# Patient Record
Sex: Male | Born: 1960
Health system: Southern US, Community
[De-identification: ages and names within clinical notes are randomized; demographics above are authoritative.]

## PROBLEM LIST (undated history)

## (undated) DIAGNOSIS — I340 Nonrheumatic mitral (valve) insufficiency: Secondary | ICD-10-CM

## (undated) DIAGNOSIS — F1410 Cocaine abuse, uncomplicated: Secondary | ICD-10-CM

## (undated) DIAGNOSIS — I2510 Atherosclerotic heart disease of native coronary artery without angina pectoris: Secondary | ICD-10-CM

## (undated) DIAGNOSIS — I219 Acute myocardial infarction, unspecified: Secondary | ICD-10-CM

## (undated) DIAGNOSIS — E7800 Pure hypercholesterolemia, unspecified: Secondary | ICD-10-CM

## (undated) DIAGNOSIS — I10 Essential (primary) hypertension: Secondary | ICD-10-CM

## (undated) DIAGNOSIS — T8859XA Other complications of anesthesia, initial encounter: Secondary | ICD-10-CM

## (undated) DIAGNOSIS — Z8679 Personal history of other diseases of the circulatory system: Secondary | ICD-10-CM

## (undated) DIAGNOSIS — T4145XA Adverse effect of unspecified anesthetic, initial encounter: Secondary | ICD-10-CM

## (undated) HISTORY — PX: TONSILLECTOMY: SUR1361

---

## 2002-08-28 ENCOUNTER — Encounter: Payer: Self-pay | Admitting: Emergency Medicine

## 2002-08-28 ENCOUNTER — Emergency Department (HOSPITAL_COMMUNITY): Admission: EM | Admit: 2002-08-28 | Discharge: 2002-08-28 | Payer: Self-pay | Admitting: Emergency Medicine

## 2002-09-02 ENCOUNTER — Emergency Department (HOSPITAL_COMMUNITY): Admission: EM | Admit: 2002-09-02 | Discharge: 2002-09-02 | Payer: Self-pay | Admitting: Emergency Medicine

## 2003-03-27 ENCOUNTER — Encounter
Admission: RE | Admit: 2003-03-27 | Discharge: 2003-06-25 | Payer: Self-pay | Admitting: Physical Medicine & Rehabilitation

## 2003-04-13 ENCOUNTER — Emergency Department (HOSPITAL_COMMUNITY): Admission: AD | Admit: 2003-04-13 | Discharge: 2003-04-13 | Payer: Self-pay | Admitting: Family Medicine

## 2003-05-08 ENCOUNTER — Emergency Department (HOSPITAL_COMMUNITY): Admission: EM | Admit: 2003-05-08 | Discharge: 2003-05-08 | Payer: Self-pay | Admitting: Emergency Medicine

## 2003-06-12 ENCOUNTER — Ambulatory Visit (HOSPITAL_COMMUNITY): Admission: RE | Admit: 2003-06-12 | Discharge: 2003-06-12 | Payer: Self-pay | Admitting: Orthopaedic Surgery

## 2005-04-06 ENCOUNTER — Emergency Department (HOSPITAL_COMMUNITY): Admission: EM | Admit: 2005-04-06 | Discharge: 2005-04-06 | Payer: Self-pay | Admitting: Emergency Medicine

## 2005-04-08 ENCOUNTER — Emergency Department (HOSPITAL_COMMUNITY): Admission: EM | Admit: 2005-04-08 | Discharge: 2005-04-08 | Payer: Self-pay | Admitting: Family Medicine

## 2005-04-12 ENCOUNTER — Emergency Department (HOSPITAL_COMMUNITY): Admission: AD | Admit: 2005-04-12 | Discharge: 2005-04-12 | Payer: Self-pay | Admitting: Family Medicine

## 2009-01-10 ENCOUNTER — Ambulatory Visit: Payer: Self-pay | Admitting: Internal Medicine

## 2009-01-10 DIAGNOSIS — M545 Low back pain: Secondary | ICD-10-CM

## 2009-01-10 DIAGNOSIS — M542 Cervicalgia: Secondary | ICD-10-CM

## 2009-01-10 DIAGNOSIS — I10 Essential (primary) hypertension: Secondary | ICD-10-CM | POA: Insufficient documentation

## 2009-01-10 DIAGNOSIS — M25519 Pain in unspecified shoulder: Secondary | ICD-10-CM

## 2009-01-16 ENCOUNTER — Telehealth (INDEPENDENT_AMBULATORY_CARE_PROVIDER_SITE_OTHER): Payer: Self-pay | Admitting: *Deleted

## 2009-01-21 ENCOUNTER — Telehealth: Payer: Self-pay | Admitting: Internal Medicine

## 2009-02-01 ENCOUNTER — Telehealth: Payer: Self-pay | Admitting: Internal Medicine

## 2009-02-14 ENCOUNTER — Telehealth: Payer: Self-pay | Admitting: Internal Medicine

## 2009-03-04 ENCOUNTER — Telehealth: Payer: Self-pay | Admitting: Internal Medicine

## 2009-03-07 ENCOUNTER — Telehealth: Payer: Self-pay | Admitting: Internal Medicine

## 2009-04-04 ENCOUNTER — Emergency Department (HOSPITAL_COMMUNITY): Admission: EM | Admit: 2009-04-04 | Discharge: 2009-04-04 | Payer: Self-pay | Admitting: Emergency Medicine

## 2011-06-12 ENCOUNTER — Emergency Department (HOSPITAL_COMMUNITY)
Admission: EM | Admit: 2011-06-12 | Discharge: 2011-06-13 | Disposition: A | Discharge status: Home / Self Care | Payer: No Typology Code available for payment source | Attending: Emergency Medicine | Admitting: Emergency Medicine

## 2011-06-12 ENCOUNTER — Encounter: Payer: Self-pay | Admitting: Emergency Medicine

## 2011-06-12 DIAGNOSIS — M79609 Pain in unspecified limb: Secondary | ICD-10-CM | POA: Insufficient documentation

## 2011-06-12 DIAGNOSIS — M25449 Effusion, unspecified hand: Secondary | ICD-10-CM | POA: Insufficient documentation

## 2011-06-12 DIAGNOSIS — S62609A Fracture of unspecified phalanx of unspecified finger, initial encounter for closed fracture: Secondary | ICD-10-CM | POA: Insufficient documentation

## 2011-06-12 DIAGNOSIS — S8000XA Contusion of unspecified knee, initial encounter: Secondary | ICD-10-CM | POA: Insufficient documentation

## 2011-06-12 DIAGNOSIS — S139XXA Sprain of joints and ligaments of unspecified parts of neck, initial encounter: Secondary | ICD-10-CM | POA: Insufficient documentation

## 2011-06-12 DIAGNOSIS — M25569 Pain in unspecified knee: Secondary | ICD-10-CM | POA: Insufficient documentation

## 2011-06-12 DIAGNOSIS — S161XXA Strain of muscle, fascia and tendon at neck level, initial encounter: Secondary | ICD-10-CM
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
## 2011-06-12 NOTE — ED Notes (Signed)
C-Collar applied

## 2011-06-13 ENCOUNTER — Emergency Department (HOSPITAL_COMMUNITY): Payer: Self-pay

## 2011-06-13 MED ORDER — HYDROCODONE-ACETAMINOPHEN 5-325 MG PO TABS: 2.0000 | ORAL_TABLET | ORAL | Status: AC | PRN

## 2011-06-13 MED ORDER — HYDROCODONE-ACETAMINOPHEN 5-325 MG PO TABS
1.0000 | ORAL_TABLET | Freq: Once | ORAL | Status: AC
  Administered 2011-06-13: 1 via ORAL
  Filled 2011-06-13: qty 1

## 2011-06-13 MED ORDER — IBUPROFEN 800 MG PO TABS
800.0000 mg | ORAL_TABLET | Freq: Once | ORAL | Status: AC
  Administered 2011-06-13: 800 mg via ORAL
  Filled 2011-06-13: qty 1

## 2011-06-13 MED ORDER — DIAZEPAM 5 MG PO TABS
5.0000 mg | ORAL_TABLET | Freq: Once | ORAL | Status: AC
  Administered 2011-06-13: 5 mg via ORAL
  Filled 2011-06-13: qty 1

## 2011-06-13 MED ORDER — HYDROCODONE-ACETAMINOPHEN 7.5-325 MG/15ML PO SOLN: 15.0000 mL | Freq: Four times a day (QID) | ORAL | Status: DC | PRN

## 2011-06-13 MED ORDER — IBUPROFEN 400 MG PO TABS: 400.0000 mg | ORAL_TABLET | Freq: Four times a day (QID) | ORAL | Status: AC | PRN

## 2011-06-13 MED ORDER — CYCLOBENZAPRINE HCL 10 MG PO TABS: 10.0000 mg | ORAL_TABLET | Freq: Three times a day (TID) | ORAL | Status: AC | PRN
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
## 2013-07-03 ENCOUNTER — Encounter (HOSPITAL_COMMUNITY): Payer: Self-pay | Admitting: Emergency Medicine

## 2013-07-03 ENCOUNTER — Emergency Department (HOSPITAL_COMMUNITY)
Admission: EM | Admit: 2013-07-03 | Discharge: 2013-07-03 | Disposition: A | Discharge status: Home / Self Care | Payer: No Typology Code available for payment source | Attending: Emergency Medicine | Admitting: Emergency Medicine

## 2013-07-03 DIAGNOSIS — M5430 Sciatica, unspecified side: Secondary | ICD-10-CM | POA: Insufficient documentation

## 2013-07-03 DIAGNOSIS — M545 Low back pain, unspecified: Secondary | ICD-10-CM | POA: Insufficient documentation

## 2013-07-03 DIAGNOSIS — M5432 Sciatica, left side: Secondary | ICD-10-CM

## 2013-07-03 DIAGNOSIS — Z79899 Other long term (current) drug therapy: Secondary | ICD-10-CM | POA: Insufficient documentation

## 2013-07-03 DIAGNOSIS — Z888 Allergy status to other drugs, medicaments and biological substances status: Secondary | ICD-10-CM | POA: Insufficient documentation

## 2013-07-03 MED ORDER — MELOXICAM 7.5 MG PO TABS: 15.0000 mg | ORAL_TABLET | Freq: Every day | ORAL | Status: DC

## 2013-07-03 MED ORDER — METHOCARBAMOL 500 MG PO TABS: 500.0000 mg | ORAL_TABLET | Freq: Two times a day (BID) | ORAL | Status: DC

## 2013-07-03 NOTE — ED Notes (Signed)
Pt discharged.Vital signs stable and GCS 15 

## 2013-07-03 NOTE — Discharge Instructions (Signed)
Recommend Mobic and Robaxin for symptoms as prescribed. Also recommend ice the affected area at least 3-4 times per day for at least 30 minutes each time until symptoms improve. Refrain from strenuous activity or heavy lifting. Followup with an orthopedist as necessary if no improvement in symptoms.  Sciatica Sciatica is pain, weakness, numbness, or tingling along the path of the sciatic nerve. The nerve starts in the lower back and runs down the back of each leg. The nerve controls the muscles in the lower leg and in the back of the knee, while also providing sensation to the back of the thigh, lower leg, and the sole of your foot. Sciatica is a symptom of another medical condition. For instance, nerve damage or certain conditions, such as a herniated disk or bone spur on the spine, pinch or put pressure on the sciatic nerve. This causes the pain, weakness, or other sensations normally associated with sciatica. Generally, sciatica only affects one side of the body. CAUSES   Herniated or slipped disc.  Degenerative disk disease.  A pain disorder involving the narrow muscle in the buttocks (piriformis syndrome).  Pelvic injury or fracture.  Pregnancy.  Tumor (rare). SYMPTOMS  Symptoms can vary from mild to very severe. The symptoms usually travel from the low back to the buttocks and down the back of the leg. Symptoms can include:  Mild tingling or dull aches in the lower back, leg, or hip.  Numbness in the back of the calf or sole of the foot.  Burning sensations in the lower back, leg, or hip.  Sharp pains in the lower back, leg, or hip.  Leg weakness.  Severe back pain inhibiting movement. These symptoms may get worse with coughing, sneezing, laughing, or prolonged sitting or standing. Also, being overweight may worsen symptoms. DIAGNOSIS  Your caregiver will perform a physical exam to look for common symptoms of sciatica. He or she may ask you to do certain movements or activities  that would trigger sciatic nerve pain. Other tests may be performed to find the cause of the sciatica. These may include:  Blood tests.  X-rays.  Imaging tests, such as an MRI or CT scan. TREATMENT  Treatment is directed at the cause of the sciatic pain. Sometimes, treatment is not necessary and the pain and discomfort goes away on its own. If treatment is needed, your caregiver may suggest:  Over-the-counter medicines to relieve pain.  Prescription medicines, such as anti-inflammatory medicine, muscle relaxants, or narcotics.  Applying heat or ice to the painful area.  Steroid injections to lessen pain, irritation, and inflammation around the nerve.  Reducing activity during periods of pain.  Exercising and stretching to strengthen your abdomen and improve flexibility of your spine. Your caregiver may suggest losing weight if the extra weight makes the back pain worse.  Physical therapy.  Surgery to eliminate what is pressing or pinching the nerve, such as a bone spur or part of a herniated disk. HOME CARE INSTRUCTIONS   Only take over-the-counter or prescription medicines for pain or discomfort as directed by your caregiver.  Apply ice to the affected area for 20 minutes, 3 4 times a day for the first 48 72 hours. Then try heat in the same way.  Exercise, stretch, or perform your usual activities if these do not aggravate your pain.  Attend physical therapy sessions as directed by your caregiver.  Keep all follow-up appointments as directed by your caregiver.  Do not wear high heels or shoes that do  not provide proper support.  Check your mattress to see if it is too soft. A firm mattress may lessen your pain and discomfort. SEEK IMMEDIATE MEDICAL CARE IF:   You lose control of your bowel or bladder (incontinence).  You have increasing weakness in the lower back, pelvis, buttocks, or legs.  You have redness or swelling of your back.  You have a burning sensation when  you urinate.  You have pain that gets worse when you lie down or awakens you at night.  Your pain is worse than you have experienced in the past.  Your pain is lasting longer than 4 weeks.  You are suddenly losing weight without reason. MAKE SURE YOU:  Understand these instructions.  Will watch your condition.  Will get help right away if you are not doing well or get worse. Document Released: 05/26/2001 Document Revised: 12/01/2011 Document Reviewed: 10/11/2011 [facility] Patient Information 2014 [place], [place].

## 2013-07-03 NOTE — ED Notes (Signed)
Pt states woke up 4 days ago with left lower back and left hip pain and pain radiates down his leg.  No injury
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
## 2013-07-06 NOTE — ED Provider Notes (Signed)
Medical screening examination/treatment/procedure(s) were performed by non-physician practitioner and as supervising physician I was immediately available for consultation/collaboration.  Talbot Monarch M Sueko Dimichele, MD 07/06/13 1842 

## 2014-01-25 ENCOUNTER — Emergency Department (HOSPITAL_COMMUNITY)
Admission: EM | Admit: 2014-01-25 | Discharge: 2014-01-25 | Disposition: A | Discharge status: Home / Self Care | Payer: No Typology Code available for payment source | Attending: Emergency Medicine | Admitting: Emergency Medicine

## 2014-01-25 ENCOUNTER — Encounter (HOSPITAL_COMMUNITY): Payer: Self-pay | Admitting: Emergency Medicine

## 2014-01-25 DIAGNOSIS — M79609 Pain in unspecified limb: Secondary | ICD-10-CM | POA: Insufficient documentation

## 2014-01-25 DIAGNOSIS — H6092 Unspecified otitis externa, left ear: Secondary | ICD-10-CM

## 2014-01-25 DIAGNOSIS — Z79899 Other long term (current) drug therapy: Secondary | ICD-10-CM | POA: Insufficient documentation

## 2014-01-25 DIAGNOSIS — H60399 Other infective otitis externa, unspecified ear: Secondary | ICD-10-CM | POA: Insufficient documentation

## 2014-01-25 DIAGNOSIS — H9209 Otalgia, unspecified ear: Secondary | ICD-10-CM | POA: Insufficient documentation

## 2014-01-25 DIAGNOSIS — Z791 Long term (current) use of non-steroidal anti-inflammatories (NSAID): Secondary | ICD-10-CM | POA: Insufficient documentation

## 2014-01-25 MED ORDER — CIPROFLOXACIN-HYDROCORTISONE 0.2-1 % OT SUSP: 3.0000 [drp] | Freq: Two times a day (BID) | OTIC | Status: DC

## 2014-01-25 NOTE — Discharge Instructions (Signed)
Use antibiotics ear drops as directed for 7 days. Refer to attached documents for more information. Follow up with your doctor for further evaluation.

## 2014-01-25 NOTE — ED Notes (Signed)
Pt is here with left ear pain and hard to hear out of it.  Pt complains of knot under right arm that hurt and now does not hurt but it is still there

## 2014-01-25 NOTE — ED Provider Notes (Signed)
CSN: [id number]     Arrival date & time 01/25/14  1657 History  This chart was scribed for Emilia BeckKaitlyn Nadalie Laughner, working with Toy CookeyMegan Docherty, MD found by Elon SpannerGarrett Cook, ED Scribe. This patient was seen in room TR07C/TR07C and the patient's care was started at 5:10 PM.    Chief Complaint  Patient presents with  . Otalgia    Patient is a 53 y.o. male presenting with ear pain. The history is provided by the patient. No language interpreter was used.  Otalgia Location:  Left Severity:  Mild Onset quality:  Gradual Duration:  2 days Timing:  Constant Progression:  Unchanged Chronicity:  Recurrent Worsened by:  Nothing tried Associated symptoms: no congestion, no cough, no fever, no neck pain and no sore throat     HPI Comments: Bryce Ortega is a 53 y.o. male who presents to the Emergency Department complaining of recurrence mild left ear pain that began several days ago.  Patient states he was diagnosed with an ear infection in June and prescribed neomycin, of which he compliant and has completed the course. He states the ear pain resolved at that time but has recurred since then within the last 2 days.  Patient denies cough, sore throat, fever, congestion, neck pain.  No history of DM.    Patient also complains of a "knot" under right arm with associated, currently resolved, pain in the area.  History reviewed. No pertinent past medical history. Past Surgical History  Procedure Laterality Date  . Tonsillectomy     No family history on file. History  Substance Use Topics  . Smoking status: Never Smoker   . Smokeless tobacco: Not on file  . Alcohol Use: Yes     Comment: occ    Review of Systems  Constitutional: Negative for fever.  HENT: Positive for ear pain. Negative for congestion and sore throat.   Respiratory: Negative for cough.   Musculoskeletal: Negative for neck pain.  All other systems reviewed and are negative.     Allergies  Propoxyphene n-acetaminophen  Home  Medications   Prior to Admission medications   Medication Sig Start Date End Date Taking? Authorizing Provider  losartan-hydrochlorothiazide (HYZAAR) 50-12.5 MG per tablet Take 1 tablet by mouth daily.    Historical Provider, MD  meloxicam (MOBIC) 7.5 MG tablet Take 2 tablets (15 mg total) by mouth daily. 07/03/13   Antony MaduraKelly Humes, PA-C  methocarbamol (ROBAXIN) 500 MG tablet Take 1 tablet (500 mg total) by mouth 2 (two) times daily. 07/03/13   Antony MaduraKelly Humes, PA-C  simvastatin (ZOCOR) 20 MG tablet Take 20 mg by mouth daily.    Historical Provider, MD   BP 145/78  Pulse 76  Temp(Src) 97.9 F (36.6 C) (Oral)  Resp 18  SpO2 96% Physical Exam  Nursing note and vitals reviewed. Constitutional: He is oriented to person, place, and time. He appears well-developed and well-nourished. No distress.  HENT:  Head: Normocephalic and atraumatic.  Mouth/Throat: Oropharynx is clear and moist. No oropharyngeal exudate.  Left external ear edema, erythema and purulent exudate. Tenderness to palpation. Right ear unremarkable.   Eyes: Conjunctivae and EOM are normal.  Neck: Normal range of motion.  Cardiovascular: Normal rate and regular rhythm.  Exam reveals no gallop and no friction rub.   No murmur heard. Pulmonary/Chest: Effort normal and breath sounds normal. He has no wheezes. He has no rales. He exhibits no tenderness.  Musculoskeletal: Normal range of motion.  Lymphadenopathy:    He has no cervical adenopathy.  Neurological: He is alert and oriented to person, place, and time. Coordination normal.  Speech is goal-oriented. Moves limbs without ataxia.   Skin: Skin is warm and dry.  Psychiatric: He has a normal mood and affect. His behavior is normal.    ED Course  Procedures (including critical care time)  DIAGNOSTIC STUDIES: Oxygen Saturation is 96% on RA, normal by my interpretation.    COORDINATION OF CARE:  5:17 PM Will prescribe antibiotics.  Patient acknowledges and agrees with plan.     Labs Review Labs Reviewed - No data to display  Imaging Review No results found.   EKG Interpretation None      MDM   Final diagnoses:  Otitis externa; acute, left    Patient has exudative otitis externa of the left ear. Patient will have cipro HC otic solution. Vitals stable and patient afebrile.   I personally performed the services described in this documentation, which was scribed in my presence. The recorded information has been reviewed and is accurate.    Emilia Beck, PA-C 01/25/14 1930

## 2014-01-26 NOTE — ED Provider Notes (Signed)
Medical screening examination/treatment/procedure(s) were performed by non-physician practitioner and as supervising physician I was immediately available for consultation/collaboration.  Toy Cookey, MD 01/26/14 2012

## 2014-06-15 DIAGNOSIS — I219 Acute myocardial infarction, unspecified: Secondary | ICD-10-CM

## 2014-06-15 DIAGNOSIS — Z8679 Personal history of other diseases of the circulatory system: Secondary | ICD-10-CM

## 2014-06-15 DIAGNOSIS — I2510 Atherosclerotic heart disease of native coronary artery without angina pectoris: Secondary | ICD-10-CM

## 2014-06-15 HISTORY — DX: Personal history of other diseases of the circulatory system: Z86.79

## 2014-06-15 HISTORY — DX: Acute myocardial infarction, unspecified: I21.9

## 2014-06-15 HISTORY — DX: Atherosclerotic heart disease of native coronary artery without angina pectoris: I25.10

## 2015-01-30 ENCOUNTER — Emergency Department (HOSPITAL_COMMUNITY)
Admission: EM | Admit: 2015-01-30 | Discharge: 2015-01-30 | Disposition: A | Discharge status: Home / Self Care | Payer: No Typology Code available for payment source | Attending: Emergency Medicine | Admitting: Emergency Medicine

## 2015-01-30 ENCOUNTER — Encounter (HOSPITAL_COMMUNITY): Payer: Self-pay | Admitting: Emergency Medicine

## 2015-01-30 DIAGNOSIS — Z79899 Other long term (current) drug therapy: Secondary | ICD-10-CM | POA: Insufficient documentation

## 2015-01-30 DIAGNOSIS — R21 Rash and other nonspecific skin eruption: Secondary | ICD-10-CM | POA: Diagnosis present

## 2015-01-30 DIAGNOSIS — L237 Allergic contact dermatitis due to plants, except food: Secondary | ICD-10-CM | POA: Diagnosis not present

## 2015-01-30 DIAGNOSIS — Z792 Long term (current) use of antibiotics: Secondary | ICD-10-CM | POA: Insufficient documentation

## 2015-01-30 DIAGNOSIS — I10 Essential (primary) hypertension: Secondary | ICD-10-CM | POA: Insufficient documentation

## 2015-01-30 DIAGNOSIS — E780 Pure hypercholesterolemia: Secondary | ICD-10-CM | POA: Diagnosis not present

## 2015-01-30 DIAGNOSIS — Z7952 Long term (current) use of systemic steroids: Secondary | ICD-10-CM | POA: Insufficient documentation

## 2015-01-30 HISTORY — DX: Pure hypercholesterolemia, unspecified: E78.00

## 2015-01-30 HISTORY — DX: Essential (primary) hypertension: I10

## 2015-01-30 MED ORDER — PREDNISONE 20 MG PO TABS: ORAL_TABLET | ORAL | Status: DC

## 2015-01-30 MED ORDER — HYDROXYZINE HCL 25 MG PO TABS
25.0000 mg | ORAL_TABLET | Freq: Once | ORAL | Status: AC
  Administered 2015-01-30: 25 mg via ORAL
  Filled 2015-01-30: qty 1

## 2015-01-30 MED ORDER — DIPHENHYDRAMINE HCL 25 MG PO TABS: 25.0000 mg | ORAL_TABLET | Freq: Four times a day (QID) | ORAL | Status: DC

## 2015-01-30 NOTE — ED Provider Notes (Signed)
History  This chart was scribed for non-physician practitioner, Mayme Genta, PA-C,working with Lavera Guise, MD, by Karle Plumber, ED Scribe. This patient was seen in room TR08C/TR08C and the patient's care was started at 4:10 PM.  Chief Complaint  Patient presents with  . Poison [place]   The history is provided by the patient and medical records. No language interpreter was used.    HPI Comments:  Bryce Ortega is a 54 y.o. male who presents to the Emergency Department complaining of an itching rash to bilateral arms, bilateral legs, neck, face and abdomen that began yesterday after cutting bushes in his yard. He reports associated burning of the rash. He states the itching is a 10/10. He has applied Calamine lotion to the areas with mild relief of the symptoms. He denies modifying factors. He denies fever ,chills, nausea, vomiting, difficulty breathing or difficulty swallowing.   Past Medical History  Diagnosis Date  . Hypertension   . High cholesterol    Past Surgical History  Procedure Laterality Date  . Tonsillectomy     No family history on file. Social History  Substance Use Topics  . Smoking status: Never Smoker   . Smokeless tobacco: None  . Alcohol Use: Yes     Comment: occ    Review of Systems  Constitutional: Negative for fever and chills.  HENT: Negative for trouble swallowing.   Respiratory: Negative for shortness of breath.   Gastrointestinal: Negative for nausea and vomiting.  Skin: Positive for rash.  All other systems reviewed and are negative.   Allergies  Propoxyphene n-acetaminophen  Home Medications   Prior to Admission medications   Medication Sig Start Date End Date Taking? Authorizing Provider  ciprofloxacin-hydrocortisone (CIPRO HC) otic suspension Place 3 drops into the left ear 2 (two) times daily. 01/25/14   Kaitlyn Szekalski, PA-C  diphenhydrAMINE (BENADRYL) 25 MG tablet Take 1 tablet (25 mg total) by mouth every 6 (six) hours. 01/30/15    Joycie Peek, PA-C  losartan-hydrochlorothiazide (HYZAAR) 50-12.5 MG per tablet Take 1 tablet by mouth daily.    Historical Provider, MD  neomycin-polymyxin-hydrocortisone (CORTISPORIN) otic solution Place 4 drops into the left ear 3 (three) times daily.     Historical Provider, MD  predniSONE (DELTASONE) 20 MG tablet 3 tabs po daily x 3 days, then 2 tabs x 3 days, then 1.5 tabs x 3 days, then 1 tab x 3 days, then 0.5 tabs x 3 days 01/30/15   Joycie Peek, PA-C  simvastatin (ZOCOR) 20 MG tablet Take 20 mg by mouth daily at 6 PM.     Historical Provider, MD   Triage Vitals: BP 137/93 mmHg  Pulse 63  Temp(Src) 98.1 F (36.7 C) (Oral)  Resp 16  Ht  (1.803 m)  Wt 168 lb (76.204 kg)  BMI 23.44 kg/m2  SpO2 97% Physical Exam  Constitutional: He is oriented to person, place, and time. He appears well-developed and well-nourished.  HENT:  Head: Normocephalic and atraumatic.  Mouth/Throat: Uvula is midline, oropharynx is clear and moist and mucous membranes are normal.  Eyes: EOM are normal.  Neck: Normal range of motion.  Cardiovascular: Normal rate, regular rhythm and normal heart sounds.  Exam reveals no gallop and no friction rub.   No murmur heard. Pulmonary/Chest: Effort normal and breath sounds normal. No respiratory distress.  Abdominal: Soft. There is no tenderness.  Musculoskeletal: Normal range of motion.  Neurological: He is alert and oriented to person, place, and time.  Skin: Skin is  warm and dry.  Diffused maculopapular rash with localized erythema. Follows a linear pattern.  Psychiatric: He has a normal mood and affect. His behavior is normal.  Nursing note and vitals reviewed.   ED Course  Procedures (including critical care time) DIAGNOSTIC STUDIES: Oxygen Saturation is 97% on RA, normal by my interpretation.   COORDINATION OF CARE: 4:13 PM- Will prescribe Prednisone and Hydroxyzine. Advised pt he could take OTC Benadryl and will order a dose prior to  discharge. Pt verbalizes understanding and agrees to plan.  Medications  hydrOXYzine (ATARAX/VISTARIL) tablet 25 mg (25 mg Oral Given 01/30/15 1622)    Labs Review Labs Reviewed - No data to display  Imaging Review No results found. I have personally reviewed and evaluated these images and lab results as part of my medical decision-making.   EKG Interpretation None     Meds given in ED:  Medications  hydrOXYzine (ATARAX/VISTARIL) tablet 25 mg (25 mg Oral Given 01/30/15 1622)    New Prescriptions   DIPHENHYDRAMINE (BENADRYL) 25 MG TABLET    Take 1 tablet (25 mg total) by mouth every 6 (six) hours.   PREDNISONE (DELTASONE) 20 MG TABLET    3 tabs po daily x 3 days, then 2 tabs x 3 days, then 1.5 tabs x 3 days, then 1 tab x 3 days, then 0.5 tabs x 3 days   Filed Vitals:   01/30/15 1517  BP: 137/93  Pulse: 63  Temp: 98.1 F (36.7 C)  TempSrc: Oral  Resp: 16  Height: 5\' 11"  (1.803 m)  Weight: 168 lb (76.204 kg)  SpO2: 97%    MDM  Vitals stable - WNL -afebrile Pt resting comfortably in ED. PE--physical exam as mentioned above and is consistent with urushiol poisoning. No evidence of anaphylaxis. No mucous membrane involvement. Treat with steroid taper, hydroxyzine for itching. Also encouraged concomitant Benadryl use if unable to fill prescription.  I discussed all relevant lab findings and imaging results with pt and they verbalized understanding. Discussed f/u with PCP within 48 hrs and return precautions, pt very amenable to plan.  Final diagnoses:  Rash  Poison ivy     I personally performed the services described in this documentation, which was scribed in my presence. The recorded information has been reviewed and is accurate.    Joycie Peek, PA-C 01/30/15 1640  Lavera Guise, MD 01/31/15 0200

## 2015-01-30 NOTE — Discharge Instructions (Signed)
Please take your medications as prescribed. Take all of your medications and do not share them. Follow up with her primary care as needed. Return to ED for worsening symptoms.  Allergies Allergies may happen from anything your body is sensitive to. This may be food, medicines, pollens, chemicals, and nearly anything around you in everyday life that produces allergens. An allergen is anything that causes an allergy producing substance. Heredity is often a factor in causing these problems. This means you may have some of the same allergies as your parents. Food allergies happen in all age groups. Food allergies are some of the most severe and life threatening. Some common food allergies are cow's milk, seafood, eggs, nuts, wheat, and soybeans. SYMPTOMS   Swelling around the mouth.  An itchy red rash or hives.  Vomiting or diarrhea.  Difficulty breathing. SEVERE ALLERGIC REACTIONS ARE LIFE-THREATENING. This reaction is called anaphylaxis. It can cause the mouth and throat to swell and cause difficulty with breathing and swallowing. In severe reactions only a trace amount of food (for example, peanut oil in a salad) may cause death within seconds. Seasonal allergies occur in all age groups. These are seasonal because they usually occur during the same season every year. They may be a reaction to molds, grass pollens, or tree pollens. Other causes of problems are house dust mite allergens, pet dander, and mold spores. The symptoms often consist of nasal congestion, a runny itchy nose associated with sneezing, and tearing itchy eyes. There is often an associated itching of the mouth and ears. The problems happen when you come in contact with pollens and other allergens. Allergens are the particles in the air that the body reacts to with an allergic reaction. This causes you to release allergic antibodies. Through a chain of events, these eventually cause you to release histamine into the blood stream.  Although it is meant to be protective to the body, it is this release that causes your discomfort. This is why you were given anti-histamines to feel better. If you are unable to pinpoint the offending allergen, it may be determined by skin or blood testing. Allergies cannot be cured but can be controlled with medicine. Hay fever is a collection of all or some of the seasonal allergy problems. It may often be treated with simple over-the-counter medicine such as diphenhydramine. Take medicine as directed. Do not drink alcohol or drive while taking this medicine. Check with your caregiver or package insert for child dosages. If these medicines are not effective, there are many new medicines your caregiver can prescribe. Stronger medicine such as nasal spray, eye drops, and corticosteroids may be used if the first things you try do not work well. Other treatments such as immunotherapy or desensitizing injections can be used if all else fails. Follow up with your caregiver if problems continue. These seasonal allergies are usually not life threatening. They are generally more of a nuisance that can often be handled using medicine. HOME CARE INSTRUCTIONS   If unsure what causes a reaction, keep a diary of foods eaten and symptoms that follow. Avoid foods that cause reactions.  If hives or rash are present:  Take medicine as directed.  You may use an over-the-counter antihistamine (diphenhydramine) for hives and itching as needed.  Apply cold compresses (cloths) to the skin or take baths in cool water. Avoid hot baths or showers. Heat will make a rash and itching worse.  If you are severely allergic:  Following a treatment for a severe  reaction, hospitalization is often required for closer follow-up.  Wear a medic-alert bracelet or necklace stating the allergy.  You and your family must learn how to give adrenaline or use an anaphylaxis kit.  If you have had a severe reaction, always carry your  anaphylaxis kit or EpiPen with you. Use this medicine as directed by your caregiver if a severe reaction is occurring. Failure to do so could have a fatal outcome. SEEK MEDICAL CARE IF:  You suspect a food allergy. Symptoms generally happen within 30 minutes of eating a food.  Your symptoms have not gone away within 2 days or are getting worse.  You develop new symptoms.  You want to retest yourself or your child with a food or drink you think causes an allergic reaction. Never do this if an anaphylactic reaction to that food or drink has happened before. Only do this under the care of a caregiver. SEEK IMMEDIATE MEDICAL CARE IF:   You have difficulty breathing, are wheezing, or have a tight feeling in your chest or throat.  You have a swollen mouth, or you have hives, swelling, or itching all over your body.  You have had a severe reaction that has responded to your anaphylaxis kit or an EpiPen. These reactions may return when the medicine has worn off. These reactions should be considered life threatening. MAKE SURE YOU:   Understand these instructions.  Will watch your condition.  Will get help right away if you are not doing well or get worse. Document Released: 08/25/2002 Document Revised: 09/26/2012 Document Reviewed: 01/30/2008 [facility] Patient Information 2015 [place], [place]. This information is not intended to replace advice given to you by your health care provider. Make sure you discuss any questions you have with your health care provider.

## 2015-01-30 NOTE — ED Notes (Signed)
C/o poison ivy on bilateral arms, legs, face, and neck since cutting brush yesterday.

## 2015-01-30 NOTE — ED Notes (Signed)
Pt left with all belongings. 

## 2015-05-01 ENCOUNTER — Emergency Department (HOSPITAL_COMMUNITY)
Admission: EM | Admit: 2015-05-01 | Discharge: 2015-05-01 | Disposition: A | Discharge status: Home / Self Care | Payer: No Typology Code available for payment source | Attending: Emergency Medicine | Admitting: Emergency Medicine

## 2015-05-01 ENCOUNTER — Encounter (HOSPITAL_COMMUNITY): Payer: Self-pay | Admitting: Emergency Medicine

## 2015-05-01 ENCOUNTER — Emergency Department (HOSPITAL_COMMUNITY): Payer: No Typology Code available for payment source

## 2015-05-01 DIAGNOSIS — S3992XA Unspecified injury of lower back, initial encounter: Secondary | ICD-10-CM | POA: Insufficient documentation

## 2015-05-01 DIAGNOSIS — Y92410 Unspecified street and highway as the place of occurrence of the external cause: Secondary | ICD-10-CM | POA: Diagnosis not present

## 2015-05-01 DIAGNOSIS — M549 Dorsalgia, unspecified: Secondary | ICD-10-CM

## 2015-05-01 DIAGNOSIS — E7800 Pure hypercholesterolemia, unspecified: Secondary | ICD-10-CM | POA: Diagnosis not present

## 2015-05-01 DIAGNOSIS — I10 Essential (primary) hypertension: Secondary | ICD-10-CM | POA: Insufficient documentation

## 2015-05-01 DIAGNOSIS — Z79899 Other long term (current) drug therapy: Secondary | ICD-10-CM | POA: Diagnosis not present

## 2015-05-01 DIAGNOSIS — Y9389 Activity, other specified: Secondary | ICD-10-CM | POA: Insufficient documentation

## 2015-05-01 DIAGNOSIS — S199XXA Unspecified injury of neck, initial encounter: Secondary | ICD-10-CM | POA: Diagnosis not present

## 2015-05-01 DIAGNOSIS — Y998 Other external cause status: Secondary | ICD-10-CM | POA: Diagnosis not present

## 2015-05-01 DIAGNOSIS — S29009A Unspecified injury of muscle and tendon of unspecified wall of thorax, initial encounter: Secondary | ICD-10-CM | POA: Insufficient documentation

## 2015-05-01 DIAGNOSIS — Z7952 Long term (current) use of systemic steroids: Secondary | ICD-10-CM | POA: Diagnosis not present

## 2015-05-01 DIAGNOSIS — Z792 Long term (current) use of antibiotics: Secondary | ICD-10-CM | POA: Insufficient documentation

## 2015-05-01 MED ORDER — CYCLOBENZAPRINE HCL 10 MG PO TABS: 10.0000 mg | ORAL_TABLET | Freq: Two times a day (BID) | ORAL | Status: DC | PRN
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
## 2015-05-01 NOTE — Discharge Instructions (Signed)
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
## 2015-05-01 NOTE — ED Notes (Signed)
Patient placed in gown

## 2015-05-10 ENCOUNTER — Other Ambulatory Visit: Payer: Self-pay

## 2015-05-10 ENCOUNTER — Inpatient Hospital Stay (HOSPITAL_COMMUNITY)
Admission: EM | Admit: 2015-05-10 | Discharge: 2015-05-11 | DRG: 896 | Disposition: A | Discharge status: Home / Self Care | Payer: No Typology Code available for payment source | Attending: Internal Medicine | Admitting: Internal Medicine

## 2015-05-10 ENCOUNTER — Other Ambulatory Visit (HOSPITAL_COMMUNITY): Payer: Self-pay

## 2015-05-10 ENCOUNTER — Encounter (HOSPITAL_COMMUNITY): Payer: Self-pay | Admitting: Emergency Medicine

## 2015-05-10 ENCOUNTER — Emergency Department (HOSPITAL_COMMUNITY): Payer: No Typology Code available for payment source

## 2015-05-10 ENCOUNTER — Encounter (HOSPITAL_COMMUNITY)
Admission: EM | Disposition: A | Discharge status: Home / Self Care | Payer: Self-pay | Source: Home / Self Care | Attending: Internal Medicine

## 2015-05-10 DIAGNOSIS — F1410 Cocaine abuse, uncomplicated: Secondary | ICD-10-CM

## 2015-05-10 DIAGNOSIS — Z79899 Other long term (current) drug therapy: Secondary | ICD-10-CM | POA: Diagnosis not present

## 2015-05-10 DIAGNOSIS — I10 Essential (primary) hypertension: Secondary | ICD-10-CM | POA: Diagnosis present

## 2015-05-10 DIAGNOSIS — I4901 Ventricular fibrillation: Secondary | ICD-10-CM | POA: Diagnosis present

## 2015-05-10 DIAGNOSIS — E785 Hyperlipidemia, unspecified: Secondary | ICD-10-CM | POA: Diagnosis present

## 2015-05-10 DIAGNOSIS — E876 Hypokalemia: Secondary | ICD-10-CM | POA: Diagnosis present

## 2015-05-10 DIAGNOSIS — E7800 Pure hypercholesterolemia, unspecified: Secondary | ICD-10-CM | POA: Diagnosis present

## 2015-05-10 DIAGNOSIS — I2510 Atherosclerotic heart disease of native coronary artery without angina pectoris: Secondary | ICD-10-CM

## 2015-05-10 DIAGNOSIS — N179 Acute kidney failure, unspecified: Secondary | ICD-10-CM | POA: Diagnosis present

## 2015-05-10 DIAGNOSIS — I25111 Atherosclerotic heart disease of native coronary artery with angina pectoris with documented spasm: Secondary | ICD-10-CM | POA: Diagnosis present

## 2015-05-10 DIAGNOSIS — I469 Cardiac arrest, cause unspecified: Secondary | ICD-10-CM | POA: Diagnosis present

## 2015-05-10 DIAGNOSIS — I200 Unstable angina: Secondary | ICD-10-CM

## 2015-05-10 DIAGNOSIS — F14188 Cocaine abuse with other cocaine-induced disorder: Secondary | ICD-10-CM | POA: Diagnosis present

## 2015-05-10 DIAGNOSIS — I249 Acute ischemic heart disease, unspecified: Secondary | ICD-10-CM

## 2015-05-10 HISTORY — PX: CARDIAC CATHETERIZATION: SHX172

## 2015-05-10 LAB — CBC
HCT: 46.5 % (ref 39.0–52.0)
HEMATOCRIT: 43.8 % (ref 39.0–52.0)
Hemoglobin: 15.4 g/dL (ref 13.0–17.0)
Hemoglobin: 14.3 g/dL (ref 13.0–17.0)
MCH: 30.9 pg (ref 26.0–34.0)
MCH: 30.8 pg (ref 26.0–34.0)
MCHC: 33.1 g/dL (ref 30.0–36.0)
MCHC: 32.6 g/dL (ref 30.0–36.0)
MCV: 93.4 fL (ref 78.0–100.0)
MCV: 94.4 fL (ref 78.0–100.0)
Platelets: 366 10*3/uL (ref 150–400)
Platelets: 290 10*3/uL (ref 150–400)
RBC: 4.98 MIL/uL (ref 4.22–5.81)
RBC: 4.64 MIL/uL (ref 4.22–5.81)
RDW: 13.3 % (ref 11.5–15.5)
RDW: 13.3 % (ref 11.5–15.5)
WBC: 9.7 10*3/uL (ref 4.0–10.5)
WBC: 13.4 10*3/uL — ABNORMAL HIGH (ref 4.0–10.5)

## 2015-05-10 LAB — BASIC METABOLIC PANEL
Anion gap: 10 (ref 5–15)
BUN: 14 mg/dL (ref 6–20)
CO2: 19 mmol/L — ABNORMAL LOW (ref 22–32)
Calcium: 8.5 mg/dL — ABNORMAL LOW (ref 8.9–10.3)
Chloride: 109 mmol/L (ref 101–111)
Creatinine, Ser: 1.46 mg/dL — ABNORMAL HIGH (ref 0.61–1.24)
GFR calc Af Amer: >60 mL/min (ref >60)
GFR calc non Af Amer: 53 mL/min — ABNORMAL LOW (ref >60)
Glucose, Bld: 181 mg/dL — ABNORMAL HIGH (ref 65–99)
Potassium: 3.2 mmol/L — ABNORMAL LOW (ref 3.5–5.1)
Sodium: 138 mmol/L (ref 135–145)

## 2015-05-10 LAB — TROPONIN I
TROPONIN I: 0.19 ng/mL — AB (ref <0.031)
Troponin I: 0.15 ng/mL — ABNORMAL HIGH (ref <0.031)

## 2015-05-10 LAB — RAPID URINE DRUG SCREEN, HOSP PERFORMED
AMPHETAMINES: NOT DETECTED
BENZODIAZEPINES: NOT DETECTED
Barbiturates: NOT DETECTED
COCAINE: POSITIVE — AB
OPIATES: NOT DETECTED
Tetrahydrocannabinol: NOT DETECTED

## 2015-05-10 LAB — COMPREHENSIVE METABOLIC PANEL
ALK PHOS: 57 U/L (ref 38–126)
ALT: 42 U/L (ref 17–63)
AST: 46 U/L — AB (ref 15–41)
Albumin: 3.4 g/dL — ABNORMAL LOW (ref 3.5–5.0)
Anion gap: 4 — ABNORMAL LOW (ref 5–15)
BILIRUBIN TOTAL: 0.7 mg/dL (ref 0.3–1.2)
BUN: 16 mg/dL (ref 6–20)
CALCIUM: 8.6 mg/dL — AB (ref 8.9–10.3)
CO2: 26 mmol/L (ref 22–32)
CREATININE: 1.17 mg/dL (ref 0.61–1.24)
Chloride: 110 mmol/L (ref 101–111)
Glucose, Bld: 122 mg/dL — ABNORMAL HIGH (ref 65–99)
Potassium: 4.7 mmol/L (ref 3.5–5.1)
Sodium: 140 mmol/L (ref 135–145)
Total Protein: 5.6 g/dL — ABNORMAL LOW (ref 6.5–8.1)

## 2015-05-10 LAB — CBG MONITORING, ED: Glucose-Capillary: 156 mg/dL — ABNORMAL HIGH (ref 65–99)

## 2015-05-10 LAB — I-STAT TROPONIN, ED: Troponin i, poc: 0.02 ng/mL (ref 0.00–0.08)

## 2015-05-10 LAB — MRSA PCR SCREENING: MRSA by PCR: POSITIVE — AB

## 2015-05-10 LAB — MAGNESIUM
MAGNESIUM: 2.1 mg/dL (ref 1.7–2.4)
MAGNESIUM: 2.1 mg/dL (ref 1.7–2.4)

## 2015-05-10 SURGERY — LEFT HEART CATH AND CORONARY ANGIOGRAPHY
Anesthesia: LOCAL

## 2015-05-10 MED ORDER — SODIUM CHLORIDE 0.9 % IV SOLN: 250.0000 mL | INTRAVENOUS | Status: DC | PRN

## 2015-05-10 MED ORDER — SODIUM CHLORIDE 0.9 % IJ SOLN: 3.0000 mL | INTRAMUSCULAR | Status: DC | PRN

## 2015-05-10 MED ORDER — HEPARIN (PORCINE) IN NACL 100-0.45 UNIT/ML-% IJ SOLN
850.0000 [IU]/h | INTRAMUSCULAR | Status: DC
  Administered 2015-05-10: 850 [IU]/h via INTRAVENOUS
  Filled 2015-05-10: qty 250

## 2015-05-10 MED ORDER — HYDROCHLOROTHIAZIDE 12.5 MG PO CAPS
12.5000 mg | ORAL_CAPSULE | Freq: Every day | ORAL | Status: DC
  Administered 2015-05-10: 12.5 mg via ORAL
  Filled 2015-05-10 (×2): qty 1

## 2015-05-10 MED ORDER — ASPIRIN 81 MG PO CHEW: 324.0000 mg | CHEWABLE_TABLET | Freq: Once | ORAL | Status: DC

## 2015-05-10 MED ORDER — ONDANSETRON HCL 4 MG/2ML IJ SOLN: 4.0000 mg | Freq: Four times a day (QID) | INTRAMUSCULAR | Status: DC | PRN

## 2015-05-10 MED ORDER — ONDANSETRON HCL 4 MG/2ML IJ SOLN
4.0000 mg | Freq: Once | INTRAMUSCULAR | Status: AC
  Administered 2015-05-10: 4 mg via INTRAVENOUS

## 2015-05-10 MED ORDER — NITROGLYCERIN IN D5W 200-5 MCG/ML-% IV SOLN
INTRAVENOUS | Status: AC
Start: 2015-05-10 — End: 2015-05-10
  Filled 2015-05-10: qty 250

## 2015-05-10 MED ORDER — SODIUM CHLORIDE 0.9 % WEIGHT BASED INFUSION: 3.0000 mL/kg/h | INTRAVENOUS | Status: DC

## 2015-05-10 MED ORDER — HEPARIN SODIUM (PORCINE) 1000 UNIT/ML IJ SOLN
INTRAMUSCULAR | Status: AC
  Filled 2015-05-10: qty 1

## 2015-05-10 MED ORDER — SODIUM CHLORIDE 0.9 % IJ SOLN
3.0000 mL | Freq: Two times a day (BID) | INTRAMUSCULAR | Status: DC
  Administered 2015-05-10 – 2015-05-11 (×3): 3 mL via INTRAVENOUS

## 2015-05-10 MED ORDER — VERAPAMIL HCL 2.5 MG/ML IV SOLN
INTRAVENOUS | Status: AC
  Filled 2015-05-10: qty 2

## 2015-05-10 MED ORDER — LORAZEPAM 2 MG/ML IJ SOLN
1.0000 mg | Freq: Once | INTRAMUSCULAR | Status: AC
  Administered 2015-05-10: 1 mg via INTRAVENOUS
  Filled 2015-05-10: qty 1

## 2015-05-10 MED ORDER — NITROGLYCERIN 1 MG/10 ML FOR IR/CATH LAB
INTRA_ARTERIAL | Status: DC | PRN
  Administered 2015-05-10: 08:00:00

## 2015-05-10 MED ORDER — HEPARIN BOLUS VIA INFUSION
4000.0000 [IU] | Freq: Once | INTRAVENOUS | Status: AC
  Filled 2015-05-10: qty 4000

## 2015-05-10 MED ORDER — IOHEXOL 350 MG/ML SOLN
INTRAVENOUS | Status: DC | PRN
  Administered 2015-05-10: 80 mL via INTRA_ARTERIAL

## 2015-05-10 MED ORDER — ATORVASTATIN CALCIUM 80 MG PO TABS
80.0000 mg | ORAL_TABLET | Freq: Every day | ORAL | Status: DC
  Administered 2015-05-10: 80 mg via ORAL
  Filled 2015-05-10: qty 1

## 2015-05-10 MED ORDER — NITROGLYCERIN 1 MG/10 ML FOR IR/CATH LAB
INTRA_ARTERIAL | Status: AC
  Filled 2015-05-10: qty 10

## 2015-05-10 MED ORDER — POTASSIUM CHLORIDE CRYS ER 20 MEQ PO TBCR
40.0000 meq | EXTENDED_RELEASE_TABLET | ORAL | Status: AC
  Administered 2015-05-10 (×3): 40 meq via ORAL
  Filled 2015-05-10 (×3): qty 2

## 2015-05-10 MED ORDER — SODIUM CHLORIDE 0.9 % WEIGHT BASED INFUSION
1.0000 mL/kg/h | INTRAVENOUS | Status: AC
Start: 2015-05-10 — End: 2015-05-10
  Administered 2015-05-10: 1 mL/kg/h via INTRAVENOUS

## 2015-05-10 MED ORDER — ONDANSETRON HCL 4 MG/2ML IJ SOLN
INTRAMUSCULAR | Status: AC
  Filled 2015-05-10: qty 2

## 2015-05-10 MED ORDER — HEPARIN (PORCINE) IN NACL 2-0.9 UNIT/ML-% IJ SOLN
INTRAMUSCULAR | Status: AC
  Filled 2015-05-10: qty 1000

## 2015-05-10 MED ORDER — SODIUM CHLORIDE 0.9 % IV BOLUS (SEPSIS)
500.0000 mL | Freq: Once | INTRAVENOUS | Status: AC
  Administered 2015-05-10: 500 mL via INTRAVENOUS

## 2015-05-10 MED ORDER — ENOXAPARIN SODIUM 40 MG/0.4ML ~~LOC~~ SOLN
40.0000 mg | SUBCUTANEOUS | Status: DC
  Administered 2015-05-11: 40 mg via SUBCUTANEOUS
  Filled 2015-05-10: qty 0.4

## 2015-05-10 MED ORDER — HEPARIN SODIUM (PORCINE) 1000 UNIT/ML IJ SOLN
INTRAMUSCULAR | Status: DC | PRN
  Administered 2015-05-10: 35 [IU] via INTRAVENOUS

## 2015-05-10 MED ORDER — LIDOCAINE HCL (PF) 1 % IJ SOLN
INTRAMUSCULAR | Status: AC
  Filled 2015-05-10: qty 30

## 2015-05-10 MED ORDER — SODIUM CHLORIDE 0.9 % IJ SOLN
3.0000 mL | Freq: Two times a day (BID) | INTRAMUSCULAR | Status: DC
  Administered 2015-05-11: 3 mL via INTRAVENOUS

## 2015-05-10 MED ORDER — ASPIRIN 81 MG PO CHEW: 81.0000 mg | CHEWABLE_TABLET | ORAL | Status: AC

## 2015-05-10 MED ORDER — HEPARIN (PORCINE) IN NACL 2-0.9 UNIT/ML-% IJ SOLN
INTRAMUSCULAR | Status: DC | PRN
  Administered 2015-05-10: 08:00:00 via INTRA_ARTERIAL

## 2015-05-10 MED ORDER — POTASSIUM CHLORIDE CRYS ER 20 MEQ PO TBCR: 40.0000 meq | EXTENDED_RELEASE_TABLET | Freq: Three times a day (TID) | ORAL | Status: DC

## 2015-05-10 MED ORDER — NITROGLYCERIN IN D5W 200-5 MCG/ML-% IV SOLN
20.0000 ug/min | Freq: Once | INTRAVENOUS | Status: AC
  Administered 2015-05-10: 20 ug/min via INTRAVENOUS

## 2015-05-10 MED ORDER — ACETAMINOPHEN 325 MG PO TABS: 650.0000 mg | ORAL_TABLET | ORAL | Status: DC | PRN

## 2015-05-10 MED ORDER — SODIUM CHLORIDE 0.9 % WEIGHT BASED INFUSION: 1.0000 mL/kg/h | INTRAVENOUS | Status: DC

## 2015-05-10 SURGICAL SUPPLY — 12 items
CATH INFINITI 5 FR JL3.5 (CATHETERS) ×2 IMPLANT
CATH INFINITI 5FR ANG PIGTAIL (CATHETERS) ×2 IMPLANT
CATH INFINITI JR4 5F (CATHETERS) ×2 IMPLANT
DEVICE RAD COMP TR BAND LRG (VASCULAR PRODUCTS) ×2 IMPLANT
GLIDESHEATH SLEND SS 6F .021 (SHEATH) ×2 IMPLANT
KIT HEART LEFT (KITS) ×2 IMPLANT
PACK CARDIAC CATHETERIZATION (CUSTOM PROCEDURE TRAY) ×2 IMPLANT
SYR MEDRAD MARK V 150ML (SYRINGE) ×2 IMPLANT
TRANSDUCER W/STOPCOCK (MISCELLANEOUS) ×2 IMPLANT
TUBING CIL FLEX 10 FLL-RA (TUBING) ×2 IMPLANT
WIRE HI TORQ VERSACORE-J 145CM (WIRE) ×1 IMPLANT
WIRE SAFE-T 1.5MM-J .035X260CM (WIRE) ×2 IMPLANT
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
## 2015-05-10 NOTE — ED Notes (Signed)
Received call from cath lab, emergent cath this morning. Transporting to cath lab room 3.

## 2015-05-10 NOTE — Progress Notes (Signed)
ANTICOAGULATION CONSULT NOTE - Initial Consult  Pharmacy Consult for Heparin Indication: chest pain/ACS  Allergies  Allergen Reactions  . Propoxyphene N-Acetaminophen Nausea Only    REACTION: GI upset    Patient Measurements: Height: 5\' 10"  (177.8 cm) Weight: 160 lb (72.576 kg) IBW/kg (Calculated) : 73  Vital Signs: Temp: 97.2 F (36.2 C) (11/25 0429) Temp Source: Oral (11/25 0429) BP: 135/94 mmHg (11/25 0550) Pulse Rate: 60 (11/25 0550)  Labs:  Recent Labs  05/10/15 0412  HGB 15.4  HCT 46.5  PLT 366  CREATININE 1.46*  TROPONINI <0.03    Estimated Creatinine Clearance: 59.4 mL/min (by C-G formula based on Cr of 1.46).   Medical History: Past Medical History  Diagnosis Date  . Hypertension   . High cholesterol     Medications:  Hyzaar  Zocor  Flexeril  Assessment: 54 y.o. male with chest pain/cardiac arrest for heparin Goal of Therapy:  Heparin level 0.3-0.7 units/ml Monitor platelets by anticoagulation protocol: Yes   Plan:  Heparin 4000 units IV bolus, then start heparin 850 units/hr Check heparin level in 8 hours.   Eddie Candle 05/10/2015,6:12 AM

## 2015-05-10 NOTE — ED Notes (Signed)
bensimhon came and requests nitro drip starting at 20.

## 2015-05-10 NOTE — Progress Notes (Signed)
  Patient with recurrent CP similar to previous angina. ECG ok. We have brought him to cath lab emergently.  I discussed need for cath with him prior to him receiving Ativan in ER and he agreed to proceed. Will cath with emergent consent given interim sedation  Bensimhon, Daniel,MD 7:52 AM

## 2015-05-10 NOTE — ED Notes (Signed)
Consulted Dr. Rhunette Croft about maximum dosage for titration of nitroglycerin drip. Dr stated max dose of 50 mcg/min for resolution of chest pain. Order acknowledged.

## 2015-05-10 NOTE — ED Notes (Signed)
Attempted report 
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
## 2015-05-10 NOTE — ED Notes (Signed)
Per EMS pt woke up from sleep with 10/10 chest pain that felt "like someone was sitting on him".  PT called EMS. EMS noted patient suffered seizure and went unconscious, CPR started. EMS noted torsades on monitor. Pt shocked at 100J. Pt was noted to be asystolic post shock. CPR resumed. Approximately 2 min after CPR was resumed PT woke up and started talking to EMS. EMS noted pt now in NSR. PT is AOx 4. PT denies cocaine use x 2 days. Pt denies cardiac hx. Initial bp 190/120. Upon spontaneous resuscitation bp 130/90. EMS notes pt states he has been out of HCTZ x 1 week.

## 2015-05-10 NOTE — ED Notes (Signed)
Pt had 6 occurences of emesis without warning, starting at 0530. Symptoms relieved by zofran 4 mg IV.

## 2015-05-10 NOTE — ED Notes (Signed)
Dr. Danford at the bedside. 

## 2015-05-10 NOTE — ED Notes (Signed)
Reminded patient urine sample is needed.  

## 2015-05-10 NOTE — ED Notes (Signed)
Xray changed to portable per Dr. Shyrl Numbers, portable xray at the bedside.

## 2015-05-10 NOTE — Interval H&P Note (Signed)
History and Physical Interval Note:  05/10/2015 7:53 AM  Bryce Ortega  has presented today for surgery, with the diagnosis of cp  The various methods of treatment have been discussed with the patient and family. After consideration of risks, benefits and other options for treatment, the patient has consented to  Procedure(s): Left Heart Cath and Coronary Angiography (N/A) and possible angioplasty as a surgical intervention .  The patient's history has been reviewed, patient examined, no change in status, stable for surgery.  I have reviewed the patient's chart and labs.  Questions were answered to the patient's satisfaction.     Bensimhon, Reuel Boom

## 2015-05-10 NOTE — ED Notes (Signed)
Bedside report in cath lab

## 2015-05-10 NOTE — H&P (Signed)
History and Physical  Patient Name: Bryce Ortega     BCW:[id number]    DOB: [birth year]    DOA: 05/10/2015 Referring physician: Derwood Kaplan, MD PCP: Jacklynn Barnacle, NP      Chief Complaint: Chest pain and cardiac arrest  HPI: Bryce Ortega is a 54 y.o. male with a past medical history significant for HTN and hyperlipidemia who presents with chest pain and cardiac arrest with ROSC in the field by EMS.  The patient was in his usual state of health until tonight when he woke with 10/10 chest pain. This was pressure-like, substernal, and associated with diaphoresis and SOB.  En route with EMS, the patient lost consciousness, and EMS report that he turned blue and appeared to be in torsades.  After one shock was administered, he went into asystole, after which CPR commenced and then the patient regained spontaneous rhythm.  In the ED, the patient was hemodynamically stable.  He had mild hypokalemia and low bicarbonate.  The troponin was negative.  The ECG showed sinus rhythm without ST changes, but with what appeared to be small U waves in V4-6.  The case was discussed with Cardiology on call who recommended admission to [facility] and [facility] today.  Of note, the patient reports intermittent cocaine use, as recently as two days ago.     Review of Systems:  Pt complains of chest pressure, with diaphoresis and dyspnea, now resolved.  Syncope en route. Pt denies any fever, chills, cough.  All other systems negative except as just noted or noted in the history of present illness.   Allergies:  Allergies  Allergen Reactions  . Propoxyphene N-Acetaminophen Nausea Only    REACTION: GI upset    Home medications: 1. Losartan-HCTZ 50-12.5 mg daily 2. Simvastatin 20 mg daily  Past medical history: 1. Hypertension 2. Hyperlipidemia 3. Low back pain  Past surgical history: 1. Tonsillectomy  Family history:  Mother, hypotension. Father, hypertension, heart disease unspecified.   Social  History:  Patient lives with his brother.  He is a non-smoker.  He is ambulatory without assistance.  He uses cocaine occasionally, last two days ago.        Physical Exam: BP 128/90 mmHg  Pulse 60  Temp(Src) 97.2 F (36.2 C) (Oral)  Resp 15  Ht 5\' 10"  (1.778 m)  Wt 72.576 kg (160 lb)  BMI 22.96 kg/m2  SpO2 90% General appearance: Well-developed, adult male, alert and in mild distress from trauma of event.   Eyes: Anicteric, conjunctiva pink, lids and lashes normal.     ENT: No nasal deformity, discharge, or epistaxis.  OP moist without lesions.   Skin: Warm and dry.   Cardiac: RRR, nl S1-S2, no murmurs appreciated.  Capillary refill is brisk  No JVD.  No LE edema.  Radial and DP pulses 2+ and symmetric. Respiratory: Normal respiratory rate and rhythm.  CTAB without rales or wheezes. Abdomen: Abdomen soft without rigidity.  No TTP. No ascites, distension.   MSK: No deformities or effusions. Neuro: Sensorium intact and responding to questions, attention normal.  Speech is fluent.  Moves all extremities equally and with normal coordination.    Psych: Affect anxious and restless.  No evidence of aural or visual hallucinations or delusions.       Labs on Admission:  The metabolic panel shows hypokalemia, low bicarbonate. Serum creatinine is 1.46 mg/dL, without a previous baseline. The troponin is negative. Magnesium is normal. The complete blood count shows no leukocytosis, anemia, or thrombocytopenia.  Radiological Exams on Admission: Personally reviewed: Dg Chest Portable 1 View  05/10/2015  CLINICAL DATA:  Acute onset of generalized chest pain. Initial encounter. EXAM: PORTABLE CHEST 1 VIEW COMPARISON:  None. FINDINGS: The lungs are well-aerated. Minimal bilateral atelectasis is noted. There is no evidence of pleural effusion or pneumothorax. The cardiomediastinal silhouette is borderline normal in size. No acute osseous abnormalities are seen. External pacing pads are  noted. IMPRESSION: Minimal bilateral atelectasis noted.  Lungs otherwise clear. Electronically Signed   By: Roanna Raider M.D.   On: 05/10/2015 05:12    EKG: Independently reviewed. Normal sinus rhythm without ST changes or T wave changes. Small positive deflections after the T wave in V4 and V6.  Strip from EMS reviewed and appears to show VF vs torsades.    Assessment/Plan 1. Torsades with cardiac arrest, CPR 2 and ROSC the field:  This is new.   Aspirin given.  -Admit to Stepdown and tele -Heparin gtt -LHC today, per Cardiology -Cycle troponins  2. Hypokalemia:  This is new.   -Supplement, goal > 4  3. Elevated creatinine:  Unclear chronicity. Small fluid bolus and repeat BMP  4. Hypertension:  Continue HCTZ and simvastatin Hold losartan until baseline creatinine is clearer.      DVT PPx: Heparin Diet: NPO Consultants: Cardiology Code Status: Full   Medical decision making: What exists of the patient's previous chart was reviewed in depth and the case was discussed with Dr. Rhunette Croft. Patient seen 6:15 AM on 05/10/2015.  Disposition Plan:  Admit to SDU.  LHC today per Cardiology.  Further dispo pending cath.      Alberteen Sam [facility] Pager [phone]

## 2015-05-10 NOTE — ED Notes (Signed)
Bed assignment to be changed.

## 2015-05-11 DIAGNOSIS — I10 Essential (primary) hypertension: Secondary | ICD-10-CM

## 2015-05-11 DIAGNOSIS — E785 Hyperlipidemia, unspecified: Secondary | ICD-10-CM

## 2015-05-11 LAB — BASIC METABOLIC PANEL
ANION GAP: 6 (ref 5–15)
BUN: 16 mg/dL (ref 6–20)
CHLORIDE: 105 mmol/L (ref 101–111)
CO2: 25 mmol/L (ref 22–32)
Calcium: 8.6 mg/dL — ABNORMAL LOW (ref 8.9–10.3)
Creatinine, Ser: 1.26 mg/dL — ABNORMAL HIGH (ref 0.61–1.24)
GFR calc Af Amer: >60 mL/min (ref >60)
GLUCOSE: 111 mg/dL — AB (ref 65–99)
POTASSIUM: 4.3 mmol/L (ref 3.5–5.1)
Sodium: 136 mmol/L (ref 135–145)

## 2015-05-11 LAB — CBC
HEMATOCRIT: 45.5 % (ref 39.0–52.0)
HEMOGLOBIN: 15 g/dL (ref 13.0–17.0)
MCH: 31.1 pg (ref 26.0–34.0)
MCHC: 33 g/dL (ref 30.0–36.0)
MCV: 94.4 fL (ref 78.0–100.0)
Platelets: 307 10*3/uL (ref 150–400)
RBC: 4.82 MIL/uL (ref 4.22–5.81)
RDW: 13.5 % (ref 11.5–15.5)
WBC: 9.4 10*3/uL (ref 4.0–10.5)

## 2015-05-11 MED ORDER — LOSARTAN POTASSIUM-HCTZ 50-12.5 MG PO TABS: 1.0000 | ORAL_TABLET | Freq: Every day | ORAL | Status: DC

## 2015-05-11 MED ORDER — SIMVASTATIN 20 MG PO TABS: 20.0000 mg | ORAL_TABLET | Freq: Every day | ORAL | Status: DC

## 2015-05-11 MED ORDER — ASPIRIN EC 81 MG PO TBEC: 81.0000 mg | DELAYED_RELEASE_TABLET | Freq: Every day | ORAL | Status: DC

## 2015-05-11 MED ORDER — NITROGLYCERIN 0.4 MG SL SUBL: 0.4000 mg | SUBLINGUAL_TABLET | SUBLINGUAL | Status: DC | PRN
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
## 2015-05-11 NOTE — Progress Notes (Signed)
Patient discharged to home. All belongings with patient. Education given to patient with AVS.   Albertine Patricia, RN

## 2015-05-15 ENCOUNTER — Encounter (HOSPITAL_COMMUNITY): Payer: Self-pay

## 2015-05-15 ENCOUNTER — Emergency Department (HOSPITAL_COMMUNITY): Payer: No Typology Code available for payment source

## 2015-05-15 ENCOUNTER — Inpatient Hospital Stay (HOSPITAL_COMMUNITY)
Admission: EM | Admit: 2015-05-15 | Discharge: 2015-05-16 | DRG: 280 | Disposition: A | Discharge status: Home / Self Care | Payer: No Typology Code available for payment source | Attending: Internal Medicine | Admitting: Internal Medicine

## 2015-05-15 DIAGNOSIS — T405X1A Poisoning by cocaine, accidental (unintentional), initial encounter: Secondary | ICD-10-CM | POA: Diagnosis present

## 2015-05-15 DIAGNOSIS — E876 Hypokalemia: Secondary | ICD-10-CM | POA: Diagnosis present

## 2015-05-15 DIAGNOSIS — Y92009 Unspecified place in unspecified non-institutional (private) residence as the place of occurrence of the external cause: Secondary | ICD-10-CM

## 2015-05-15 DIAGNOSIS — I201 Angina pectoris with documented spasm: Secondary | ICD-10-CM | POA: Diagnosis not present

## 2015-05-15 DIAGNOSIS — N179 Acute kidney failure, unspecified: Secondary | ICD-10-CM | POA: Diagnosis present

## 2015-05-15 DIAGNOSIS — I10 Essential (primary) hypertension: Secondary | ICD-10-CM | POA: Diagnosis present

## 2015-05-15 DIAGNOSIS — Z886 Allergy status to analgesic agent status: Secondary | ICD-10-CM

## 2015-05-15 DIAGNOSIS — R079 Chest pain, unspecified: Secondary | ICD-10-CM | POA: Diagnosis not present

## 2015-05-15 DIAGNOSIS — F1410 Cocaine abuse, uncomplicated: Secondary | ICD-10-CM | POA: Diagnosis present

## 2015-05-15 DIAGNOSIS — I2510 Atherosclerotic heart disease of native coronary artery without angina pectoris: Secondary | ICD-10-CM | POA: Diagnosis present

## 2015-05-15 DIAGNOSIS — E785 Hyperlipidemia, unspecified: Secondary | ICD-10-CM | POA: Diagnosis present

## 2015-05-15 DIAGNOSIS — I214 Non-ST elevation (NSTEMI) myocardial infarction: Secondary | ICD-10-CM | POA: Diagnosis present

## 2015-05-15 DIAGNOSIS — I469 Cardiac arrest, cause unspecified: Secondary | ICD-10-CM | POA: Diagnosis present

## 2015-05-15 LAB — CBC WITH DIFFERENTIAL/PLATELET
Basophils Absolute: 0.1 10*3/uL (ref 0.0–0.1)
Basophils Relative: 1 %
EOS PCT: 4 %
Eosinophils Absolute: 0.4 10*3/uL (ref 0.0–0.7)
HEMATOCRIT: 46.7 % (ref 39.0–52.0)
Hemoglobin: 15.8 g/dL (ref 13.0–17.0)
LYMPHS PCT: 17 %
Lymphs Abs: 1.6 10*3/uL (ref 0.7–4.0)
MCH: 31.4 pg (ref 26.0–34.0)
MCHC: 33.8 g/dL (ref 30.0–36.0)
MCV: 92.8 fL (ref 78.0–100.0)
MONO ABS: 0.9 10*3/uL (ref 0.1–1.0)
MONOS PCT: 10 %
NEUTROS ABS: 6.1 10*3/uL (ref 1.7–7.7)
Neutrophils Relative %: 68 %
PLATELETS: 316 10*3/uL (ref 150–400)
RBC: 5.03 MIL/uL (ref 4.22–5.81)
RDW: 13.2 % (ref 11.5–15.5)
WBC: 9 10*3/uL (ref 4.0–10.5)

## 2015-05-15 LAB — APTT: APTT: 28 s (ref 24–37)

## 2015-05-15 LAB — COMPREHENSIVE METABOLIC PANEL
ALBUMIN: 3.9 g/dL (ref 3.5–5.0)
ALK PHOS: 84 U/L (ref 38–126)
ALT: 27 U/L (ref 17–63)
ANION GAP: 7 (ref 5–15)
AST: 25 U/L (ref 15–41)
BILIRUBIN TOTAL: 1 mg/dL (ref 0.3–1.2)
BUN: 17 mg/dL (ref 6–20)
CALCIUM: 8.9 mg/dL (ref 8.9–10.3)
CO2: 27 mmol/L (ref 22–32)
Chloride: 100 mmol/L — ABNORMAL LOW (ref 101–111)
Creatinine, Ser: 1.18 mg/dL (ref 0.61–1.24)
GLUCOSE: 107 mg/dL — AB (ref 65–99)
Potassium: 3.5 mmol/L (ref 3.5–5.1)
Sodium: 134 mmol/L — ABNORMAL LOW (ref 135–145)
TOTAL PROTEIN: 7.4 g/dL (ref 6.5–8.1)

## 2015-05-15 LAB — RAPID URINE DRUG SCREEN, HOSP PERFORMED
AMPHETAMINES: NOT DETECTED
BARBITURATES: NOT DETECTED
BENZODIAZEPINES: POSITIVE — AB
Cocaine: POSITIVE — AB
Opiates: NOT DETECTED
TETRAHYDROCANNABINOL: NOT DETECTED

## 2015-05-15 LAB — ETHANOL

## 2015-05-15 LAB — MAGNESIUM: Magnesium: 2.2 mg/dL (ref 1.7–2.4)

## 2015-05-15 LAB — PROTIME-INR
INR: 0.97 (ref 0.00–1.49)
Prothrombin Time: 13.1 seconds (ref 11.6–15.2)

## 2015-05-15 LAB — I-STAT TROPONIN, ED: Troponin i, poc: 0.14 ng/mL (ref 0.00–0.08)

## 2015-05-15 LAB — CK: Total CK: 283 U/L (ref 49–397)

## 2015-05-15 MED ORDER — LOSARTAN POTASSIUM 50 MG PO TABS: 50.0000 mg | ORAL_TABLET | Freq: Every day | ORAL | Status: DC

## 2015-05-15 MED ORDER — MUPIROCIN 2 % EX OINT
1.0000 "application " | TOPICAL_OINTMENT | Freq: Two times a day (BID) | CUTANEOUS | Status: DC
  Administered 2015-05-15 – 2015-05-16 (×2): 1 via NASAL
  Filled 2015-05-15: qty 22

## 2015-05-15 MED ORDER — ASPIRIN 81 MG PO CHEW
324.0000 mg | CHEWABLE_TABLET | Freq: Once | ORAL | Status: AC
  Administered 2015-05-15: 324 mg via ORAL
  Filled 2015-05-15: qty 4

## 2015-05-15 MED ORDER — LOSARTAN POTASSIUM-HCTZ 50-12.5 MG PO TABS: 1.0000 | ORAL_TABLET | Freq: Every day | ORAL | Status: DC

## 2015-05-15 MED ORDER — ONDANSETRON HCL 4 MG/2ML IJ SOLN: 4.0000 mg | Freq: Four times a day (QID) | INTRAMUSCULAR | Status: DC | PRN

## 2015-05-15 MED ORDER — ENOXAPARIN SODIUM 40 MG/0.4ML ~~LOC~~ SOLN
40.0000 mg | SUBCUTANEOUS | Status: DC
  Administered 2015-05-15: 40 mg via SUBCUTANEOUS
  Filled 2015-05-15 (×2): qty 0.4

## 2015-05-15 MED ORDER — SIMVASTATIN 20 MG PO TABS
20.0000 mg | ORAL_TABLET | Freq: Every day | ORAL | Status: DC
  Filled 2015-05-15: qty 1

## 2015-05-15 MED ORDER — HYDROCHLOROTHIAZIDE 12.5 MG PO CAPS: 12.5000 mg | ORAL_CAPSULE | Freq: Every day | ORAL | Status: DC

## 2015-05-15 MED ORDER — MORPHINE SULFATE (PF) 2 MG/ML IV SOLN: 2.0000 mg | INTRAVENOUS | Status: DC | PRN

## 2015-05-15 MED ORDER — LOSARTAN POTASSIUM 25 MG PO TABS
25.0000 mg | ORAL_TABLET | Freq: Every day | ORAL | Status: DC
  Administered 2015-05-16: 25 mg via ORAL
  Filled 2015-05-15: qty 1

## 2015-05-15 MED ORDER — CHLORHEXIDINE GLUCONATE CLOTH 2 % EX PADS
6.0000 | MEDICATED_PAD | Freq: Every day | CUTANEOUS | Status: DC
  Administered 2015-05-16: 6 via TOPICAL

## 2015-05-15 MED ORDER — POTASSIUM CHLORIDE CRYS ER 20 MEQ PO TBCR
40.0000 meq | EXTENDED_RELEASE_TABLET | Freq: Once | ORAL | Status: AC
  Administered 2015-05-15: 40 meq via ORAL
  Filled 2015-05-15: qty 2

## 2015-05-15 MED ORDER — ASPIRIN EC 325 MG PO TBEC
325.0000 mg | DELAYED_RELEASE_TABLET | Freq: Every day | ORAL | Status: DC
  Administered 2015-05-16: 325 mg via ORAL
  Filled 2015-05-15: qty 1

## 2015-05-15 MED ORDER — AMLODIPINE BESYLATE 2.5 MG PO TABS
2.5000 mg | ORAL_TABLET | Freq: Every day | ORAL | Status: DC
  Administered 2015-05-15 – 2015-05-16 (×2): 2.5 mg via ORAL
  Filled 2015-05-15 (×2): qty 1

## 2015-05-15 MED ORDER — NITROGLYCERIN 0.4 MG SL SUBL: 0.4000 mg | SUBLINGUAL_TABLET | SUBLINGUAL | Status: DC | PRN

## 2015-05-15 NOTE — ED Provider Notes (Addendum)
CSN: [id number]     Arrival date & time 05/15/15  1031 History   First MD Initiated Contact with Patient 05/15/15 1049     Chief Complaint  Patient presents with  . Chest Pain  . Shortness of Breath     (Consider location/radiation/quality/duration/timing/severity/associated sxs/prior Treatment) HPI  53 year old male who presents with chest pain. He has a history of hypertension, hyperlipidemia. He was recently hospitalized earlier this month for polymorphic ventricular tachycardia/ventricular fibrillation cardiac arrest in the setting of cocaine abuse. Had a left heart catheterization on November 25 revealing mild nonocclusive CAD. Had mild electrolyte derangements during that time as well. Patient reports that he used cocaine 2 evenings ago. He woke up this morning at 10 AM with chest pressure and shortness of breath. Called EMS, and was brought to the ED. Symptoms lasted for about 1 hour, and on my evaluation of the patient at 11 10 a.m., he says that his symptoms had resolved. Denies any other drug or substance abuse. Describes pain as pressure  retrosternally that was nonradiating. Denies lightheadedness or syncope, nausea or vomiting, but did note some mild clamminess with this. Unsure if it was worse with exertion. Was not positional in nature. Pain was not pleuritic in nature. Denies any leg pain or swelling. Denies any recent fevers, chills, cough , or congestion. Past Medical History  Diagnosis Date  . Hypertension   . High cholesterol    Past Surgical History  Procedure Laterality Date  . Tonsillectomy    . Cardiac catheterization N/A 05/10/2015    Procedure: Left Heart Cath and Coronary Angiography;  Surgeon: Dolores Patty, MD;  Location: [facility] INVASIVE CV LAB;  Service: Cardiovascular;  Laterality: N/A;   Family History  Problem Relation Age of Onset  . Hypertension Father   . Heart disease Father   . Heart disease Mother   . Heart disease Maternal Grandfather    Social  History  Substance Use Topics  . Smoking status: Never Smoker   . Smokeless tobacco: None  . Alcohol Use: Yes     Comment: occ    Review of Systems 10/14 systems reviewed and are negative other than those stated in the HPI    Allergies  Propoxyphene n-acetaminophen  Home Medications   Prior to Admission medications   Medication Sig Start Date End Date Taking? Authorizing Provider  losartan-hydrochlorothiazide (HYZAAR) 50-12.5 MG tablet Take 1 tablet by mouth daily. 05/11/15  Yes Shanker Levora Dredge, MD  simvastatin (ZOCOR) 20 MG tablet Take 1 tablet (20 mg total) by mouth daily at 6 PM. 05/11/15  Yes Shanker Levora Dredge, MD  aspirin EC 81 MG tablet Take 1 tablet (81 mg total) by mouth daily. Patient not taking: Reported on 05/15/2015 05/11/15   Maretta Bees, MD  nitroGLYCERIN (NITROSTAT) 0.4 MG SL tablet Place 1 tablet (0.4 mg total) under the tongue every 5 (five) minutes as needed for chest pain (for 3 doses). Patient not taking: Reported on 05/15/2015 05/11/15   Maretta Bees, MD   BP 119/83 mmHg  Pulse 65  Temp(Src) 97.5 F (36.4 C) (Oral)  Resp 17  SpO2 99% Physical Exam Physical Exam  Nursing note and vitals reviewed. Constitutional: Well developed, well nourished, non-toxic, and in no acute distress Head: Normocephalic and atraumatic.  Mouth/Throat: Oropharynx is clear and moist.  Neck: Normal range of motion. Neck supple.  Cardiovascular: Normal rate and regular rhythm.  No edema. Pulmonary/Chest: Effort normal and breath sounds normal.  Abdominal: Soft. There is no  tenderness. There is no rebound and no guarding.  Musculoskeletal: Normal range of motion.  Neurological: Alert, no facial droop, fluent speech, moves all extremities symmetrically Skin: Skin is warm and dry.  Psychiatric: Cooperative  ED Course  Procedures (including critical care time) Labs Review Labs Reviewed  URINE RAPID DRUG SCREEN, HOSP PERFORMED - Abnormal; Notable for the  following:    Cocaine POSITIVE (*)    Benzodiazepines POSITIVE (*)    All other components within normal limits  COMPREHENSIVE METABOLIC PANEL - Abnormal; Notable for the following:    Sodium 134 (*)    Chloride 100 (*)    Glucose, Bld 107 (*)    All other components within normal limits  I-STAT TROPOININ, ED - Abnormal; Notable for the following:    Troponin i, poc 0.14 (*)    All other components within normal limits  CBC WITH DIFFERENTIAL/PLATELET  ETHANOL  APTT  PROTIME-INR  MAGNESIUM  CK    Imaging Review Dg Chest 2 View  05/15/2015  CLINICAL DATA:  Acute chest pain, shortness of breath. EXAM: CHEST  2 VIEW COMPARISON:  May 10, 2015. FINDINGS: The heart size and mediastinal contours are within normal limits. Both lungs are clear. No pneumothorax or pleural effusion is noted. The visualized skeletal structures are unremarkable. IMPRESSION: No active cardiopulmonary disease. Electronically Signed   By: Lupita Raider, M.D.   On: 05/15/2015 12:05   I have personally reviewed and evaluated these images and lab results as part of my medical decision-making.   EKG Interpretation None       05/10/2015 4:17:41 HR 79 PR 173 QTc 451 QRSD 74  sinus rhythm. Normal axis. Normal intervals. No acute ST or T-wave changes. No significant change when compared to prior EKG.    CRITICAL CARE Performed by: Lavera Guise  ?  Total critical care time: 30 minutes  Critical care time was exclusive of separately billable procedures and treating other patients.  Critical care was necessary to treat or prevent imminent or life-threatening deterioration.  Critical care was time spent personally by me on the following activities: development of treatment plan with patient and/or surrogate as well as nursing, discussions with consultants, evaluation of patient's response to treatment, examination of patient, obtaining history from patient or surrogate, ordering and performing  treatments and interventions, ordering and review of laboratory studies, ordering and review of radiographic studies, pulse oximetry and re-evaluation of patient's condition.  MDM   Final diagnoses:  NSTEMI (non-ST elevated myocardial infarction) [facility])     53 year old male who presents within a NSTEMI in the setting of cocaine abuse. Was recently admitted with V. Tach/ V. Fib arrest in the setting of cocaine use. On arrival he is hemodynamically stable. Says that his chest pain had fully resolved 10 minutes prior to me seeing him, and has no recurrence of his chest pain here. Given a full dose of aspirin. EKG reveals normal sinus rhythm without acute ischemia or infarction. He has a troponin elevation of 0.14. No major electrolyte or metabolic disturbances on blood work. UDS positive for cocaine and benzodiazepines. I did discuss this patient with cardiology, and given that he has had a negative recent left heart catheterization and V. Fib arrest was in the setting of cocaine use, they did not think he requires transfer to [facility]. Recommended admission at [facility] with the cardiology consult. Discussed with Dr. Gonzella Lex who will admit to hospitalist service for ongoing management.     Lavera Guise, MD  05/15/15 1536  Lavera Guise, MD 05/15/15 1537

## 2015-05-15 NOTE — ED Notes (Signed)
Pt given urinal and made aware we need a sample. Pt seems to be very lethargic but seemed to understand that we needed some urine.

## 2015-05-15 NOTE — Progress Notes (Signed)
CSW was consulted to speak with patient. Per note, patient presents to [facility] due to c/o central pain and SOB. Also, the patient admits to cocaine use x2 days ago.   Patient states that he lives in [place] with his brother. Patient informed CSW that his brother is his primary support. Patient informed CSW that he is employed.  CSW met with patient at bedside. Patient appeared to be lethargic and drowsy. CSW offered to provide the patient with resources for rehab and detox programs. Patient states that he is not interested in detox programs.   Patient states that he does not have any questions for CSW at this time. CSW made EDP aware.  Willette Brace [phone] ED CSW 05/15/2015 2:40 PM

## 2015-05-15 NOTE — ED Notes (Signed)
Patient denies pain at the moment

## 2015-05-15 NOTE — Progress Notes (Signed)
Pt informed CM he saw Nurse Placey after 05/11/15 d/c from [facility] and did not have issues with getting nor taking his medication Admission NSTEMI induced by cocaine

## 2015-05-15 NOTE — H&P (Signed)
[facility] History and Physical  Bryce Ortega ANV:[id number] DOB: 11-07-1960 DOA: 05/15/2015  Referring physician: Dr Verdie Mosher PCP: Jacklynn Barnacle, NP   Chief Complaint:   Chest pain  HPI:  54 year old male with history of cocaine abuse, hypertension and hyperlipidemia who was admitted to [facility] on 11/25 after having chest pain with cardiac arrest and was resuscitated in the field by EMS. This was in the setting of cocaine use. In route to the ED patient had polymorphic V. tach/V. fib arrest and after being shocked by EMS she went into asystole. CPR was performed and patient and had return of spontaneous circulation. Patient had normal mental status. In the ED patient was found to have mild acute kidney injury and hypokalemia with normal magnesium. He was admitted to hospitalist service. Patient was seen by cardiology and underwent left heart catheterization on 11/25 which showed mild nonobstructive coronary artery disease felt to be secondary to cocaine induced fast spasm. Patient discharged home on 11/26. Patient reports using crack cocaine 2 days back. This morning he woke up with pressure across his chest lasting for almost one hour. He reports associated shortness of breath. Denies any palpitations, nausea, vomiting, headache, dizziness, fevers, chills, abdominal pain, bowel or urinary symptoms. Denies syncope. Denies any aggravating or relieving factors. Patient was given full dose aspirin in the ED. His chest pain symptoms have subsided after arriving to the ED. EKG was done and as per ED physician did not show any ST-T changes. (Not yet uploaded in the system and I do not have access to it at this time.) Cardiology was consulted and recommended admission at [facility]. Recommended no need for anticoagulation. Hospitalists admission requested to telemetry.  Review of Systems:  Constitutional: Denies fever, chills, diaphoresis, appetite change and fatigue.  HEENT:  Denies visual or hearing symptoms, congestion, sore throat, difficulty swallowing, neck pain or stiffness  Respiratory: Shortness of breath, dyspnea on exertion, Denies cough,   and wheezing.   Cardiovascular: chest pain+++, denies palpitations and leg swelling.  Gastrointestinal: Denies nausea, vomiting, abdominal pain, diarrhea, constipation, blood in stool and abdominal distention.  Genitourinary: Denies dysuria,  hematuria, flank pain and difficulty urinating.  Endocrine: Denies  polyuria, polydipsia. Musculoskeletal: Denies myalgias, back pain,arthralgias and gait problem.  Skin: Denies pallor, rash and wound.  Neurological: Denies dizziness, seizures, syncope, weakness, light-headedness, numbness and headaches.  Hematological: Denies adenopathy. Psychiatric/Behavioral: Denies confusion   Past Medical History  Diagnosis Date  . Hypertension   . High cholesterol    Past Surgical History  Procedure Laterality Date  . Tonsillectomy    . Cardiac catheterization N/A 05/10/2015    Procedure: Left Heart Cath and Coronary Angiography;  Surgeon: Dolores Patty, MD;  Location: [facility] INVASIVE CV LAB;  Service: Cardiovascular;  Laterality: N/A;   Social History:  reports that he has never smoked. He does not have any smokeless tobacco history on file. He reports that he drinks alcohol. He reports that he uses illicit drugs (Cocaine).  Allergies  Allergen Reactions  . Propoxyphene N-Acetaminophen Nausea Only    REACTION: GI upset    Family History  Problem Relation Age of Onset  . Hypertension Father   . Heart disease Father   . Heart disease Mother   . Heart disease Maternal Grandfather     Prior to Admission medications   Medication Sig Start Date End Date Taking? Authorizing Provider  losartan-hydrochlorothiazide (HYZAAR) 50-12.5 MG tablet Take 1 tablet by mouth daily. 05/11/15  Yes Shanker Levora Dredge,  MD  simvastatin (ZOCOR) 20 MG tablet Take 1 tablet (20 mg total) by mouth  daily at 6 PM. 05/11/15  Yes Shanker Levora Dredge, MD  aspirin EC 81 MG tablet Take 1 tablet (81 mg total) by mouth daily. Patient not taking: Reported on 05/15/2015 05/11/15   Maretta Bees, MD  nitroGLYCERIN (NITROSTAT) 0.4 MG SL tablet Place 1 tablet (0.4 mg total) under the tongue every 5 (five) minutes as needed for chest pain (for 3 doses). Patient not taking: Reported on 05/15/2015 05/11/15   Maretta Bees, MD     Physical Exam:  Filed Vitals:   05/15/15 1042 05/15/15 1120  BP: 128/81 126/82  Pulse: 88 70  Temp: 97.5 F (36.4 C)   TempSrc: Oral   Resp: 22 17  SpO2: 95% 100%    Constitutional: Vital signs reviewed.  Middle aged male lying in bed not in distress, sleepy HEENT: no pallor, no icterus, moist oral mucosa, no cervical lymphadenopathy Cardiovascular: RRR, S1 normal, S2 normal, no MRG Chest: CTAB, no wheezes, rales, or rhonchi Abdominal: Soft. Non-tender, non-distended, bowel sounds are normal,  Ext: warm, no edema Neurological: A&O x3, non focal  Labs on Admission:  Basic Metabolic Panel:  Recent Labs Lab 05/10/15 0412 05/10/15 0902 05/11/15 0223 05/15/15 1157  NA 138 140 136 134*  K 3.2* 4.7 4.3 3.5  CL 109 110 105 100*  CO2 19* GLUCOSE 181* 122* 111* 107*  BUN CREATININE 1.46* 1.17 1.26* 1.18  CALCIUM 8.5* 8.6* 8.6* 8.9  MG 2.1 2.1  --  2.2   Liver Function Tests:  Recent Labs Lab 05/10/15 0902 05/15/15 1157  AST 46* 25  ALT 42 27  ALKPHOS 57 84  BILITOT 0.7 1.0  PROT 5.6* 7.4  ALBUMIN 3.4* 3.9   No results for input(s): LIPASE, AMYLASE in the last 168 hours. No results for input(s): AMMONIA in the last 168 hours. CBC:  Recent Labs Lab 05/10/15 0412 05/10/15 0902 05/11/15 0223 05/15/15 1126  WBC 9.7 13.4* 9.4 9.0  NEUTROABS  --   --   --  6.1  HGB 15.4 14.3 15.0 15.8  HCT 46.5 43.8 45.5 46.7  MCV 93.4 94.4 94.4 92.8  PLT 366 290 307 316   Cardiac Enzymes:  Recent Labs Lab 05/10/15 0412  05/10/15 0902 05/10/15 1136 05/15/15 1157  CKTOTAL  --   --   --  283  TROPONINI <0.03 0.15* 0.19*  --    BNP: Invalid input(s): POCBNP CBG:  Recent Labs Lab 05/10/15 0439  GLUCAP 156*    Radiological Exams on Admission: Dg Chest 2 View  05/15/2015  CLINICAL DATA:  Acute chest pain, shortness of breath. EXAM: CHEST  2 VIEW COMPARISON:  May 10, 2015. FINDINGS: The heart size and mediastinal contours are within normal limits. Both lungs are clear. No pneumothorax or pleural effusion is noted. The visualized skeletal structures are unremarkable. IMPRESSION: No active cardiopulmonary disease. Electronically Signed   By: Lupita Raider, M.D.   On: 05/15/2015 12:05    EKG:   Assessment/Plan  Principle problem: NSTEMI  appears to be cocaine induced vasospasm. Recent hospitalization for cardiac arrest with LHC showing clean coronaries. Symptoms secondary to cocaine induced vasospasm.  Admit to telemetry. ASA received in ED. Continue daily ASA. Hold off anticoagulation. avoid BB. Prn nitro and morphine for pain.  Cycle serial troponin. Cardiology to see.     Active Problems:   Essential hypertension Resume HCTZ-losartan.  Hyperlipidemia Resume statin. CK normal.    Cocaine abuse counseled on cessation. SW consulted.    Diet:cardiac  DVT prophylaxis: sq lovenox   Code Status: full code Family Communication:None at bedside Disposition Plan: admit to telemetry  Stone Spirito, [facility] Pager [phone]  Total time spent on admission 45 minutes  If 7PM-7AM, please contact night-coverage [email or web address] Password [email or web address] 05/15/2015, 1:08 PM

## 2015-05-15 NOTE — Consult Note (Signed)
CARDIOLOGY CONSULT NOTE   Patient ID: Bryce Ortega MRN: [id number], DOB/AGE: May 27, 1961   Admit date: 05/15/2015 Date of Consult: 05/15/2015  Primary Physician: Jacklynn Barnacle, NP  Reason for consult:  Chest pain  Problem List  Past Medical History  Diagnosis Date  . Hypertension   . High cholesterol     Past Surgical History  Procedure Laterality Date  . Tonsillectomy    . Cardiac catheterization N/A 05/10/2015    Procedure: Left Heart Cath and Coronary Angiography;  Surgeon: Dolores Patty, MD;  Location: [facility] INVASIVE CV LAB;  Service: Cardiovascular;  Laterality: N/A;     Allergies  Allergies  Allergen Reactions  . Propoxyphene N-Acetaminophen Nausea Only    REACTION: GI upset    HPI   54 year old male with history of cocaine abuse, hypertension and hyperlipidemia who was admitted to [facility] on 11/25 after having chest pain with cardiac arrest and was resuscitated in the field by EMS. This was in the setting of cocaine use. In route to the ED patient had polymorphic V. tach/V. fib arrest and after being shocked by EMS she went into asystole. CPR was performed and patient and had return of spontaneous circulation. Patient had normal mental status. In the ED patient was found to have mild acute kidney injury and hypokalemia with normal magnesium. He was admitted to hospitalist service. Patient was seen by cardiology and underwent left heart catheterization on 11/25 which showed mild nonobstructive coronary artery disease felt to be secondary to cocaine induced fast spasm. Patient discharged home on 11/26. Patient reports using crack cocaine 2 days back. This morning he woke up with pressure across his chest lasting for almost one hour. He reports associated shortness of breath. Denies any palpitations, nausea, vomiting, headache, dizziness, fevers, chills, abdominal pain, bowel or urinary symptoms. Denies syncope. Denies any aggravating or relieving  factors. Patient was given full dose aspirin in the ED. His chest pain symptoms have subsided after arriving to the ED. EKG was done and as per ED physician did not show any ST-T changes.  He is currently chest pain free.   Inpatient Medications  . [START ON 05/16/2015] aspirin EC  325 mg Oral Daily  . enoxaparin (LOVENOX) injection  40 mg Subcutaneous Q24H  . [START ON 05/16/2015] losartan  50 mg Oral Daily   And  . [START ON 05/16/2015] hydrochlorothiazide  12.5 mg Oral Daily  . [START ON 05/16/2015] simvastatin  20 mg Oral q1800    Family History Family History  Problem Relation Age of Onset  . Hypertension Father   . Heart disease Father   . Heart disease Mother   . Heart disease Maternal Grandfather      Social History Social History   Social History  . Marital Status: Divorced    Spouse Name: N/A  . Number of Children: N/A  . Years of Education: N/A   Occupational History  . Not on file.   Social History Main Topics  . Smoking status: Never Smoker   . Smokeless tobacco: Not on file  . Alcohol Use: Yes     Comment: occ  . Drug Use: Yes    Special: Cocaine  . Sexual Activity: Not on file   Other Topics Concern  . Not on file   Social History Narrative     Review of Systems  General:  No chills, fever, night sweats or weight changes.  Cardiovascular:  No chest pain, dyspnea on exertion, edema, orthopnea, palpitations, paroxysmal  nocturnal dyspnea. Dermatological: No rash, lesions/masses Respiratory: No cough, dyspnea Urologic: No hematuria, dysuria Abdominal:   No nausea, vomiting, diarrhea, bright red blood per rectum, melena, or hematemesis Neurologic:  No visual changes, wkns, changes in mental status. All other systems reviewed and are otherwise negative except as noted above.  Physical Exam  Blood pressure 118/75, pulse 71, temperature 97.4 F (36.3 C), temperature source Oral, resp. rate 16, height 5\' 11"  (1.803 m), weight 154 lb 15.7 oz (70.3  kg), SpO2 97 %.  General: Pleasant, NAD Psych: Normal affect. Neuro: Alert and oriented X 3. Moves all extremities spontaneously. HEENT: Normal  Neck: Supple without bruits or JVD. Lungs:  Resp regular and unlabored, CTA. Heart: RRR no s3, s4, or murmurs. Abdomen: Soft, non-tender, non-distended, BS + x 4.  Extremities: No clubbing, cyanosis or edema. DP/PT/Radials 2+ and equal bilaterally.  Labs   Recent Labs  05/15/15 1157  CKTOTAL 283   Lab Results  Component Value Date   WBC 9.0 05/15/2015   HGB 15.8 05/15/2015   HCT 46.7 05/15/2015   MCV 92.8 05/15/2015   PLT 316 05/15/2015    Recent Labs Lab 05/15/15 1157  NA 134*  K 3.5  CL 100*  CO2 27  BUN 17  CREATININE 1.18  CALCIUM 8.9  PROT 7.4  BILITOT 1.0  ALKPHOS 84  ALT 27  AST 25  GLUCOSE 107*   Radiology/Studies  Dg Chest 2 View  05/15/2015  CLINICAL DATA:  Acute chest pain, shortness of breath. EXAM: CHEST  2 VIEW COMPARISON:  May 10, 2015. FINDINGS: The heart size and mediastinal contours are within normal limits. Both lungs are clear. No pneumothorax or pleural effusion is noted. The visualized skeletal structures are unremarkable. IMPRESSION: No active cardiopulmonary disease. Electronically Signed   By: Lupita Raider,   Echocardiogram   Left sided cath:   Mid RCA lesion, 40% stenosed.  Dist LAD lesion, 30% stenosed.  The left ventricular systolic function is normal.  Assessment: 1. Mild non-obstructive CAD 2. Normal LV function  Plan/discussion: VF arrest likely cocaine-induced vasospasm. Stressed need for absolute cessation of cocaine use. With mild CAD would treat with ASA and statin. Observe overnight and home in am if stable.   Bensimhon, Daniel,MD  ECG: SR, normal ECG    ASSESSMENT AND PLAN  1. Chest pain with mild troponin elevation sec to cocaine induced vasospasm, I will add amlodipine 2.5 mg po daily to his regimen and decrease losartan to 25 mg po daily.  2. S/P VF  arrest, s/p resuscitation sec to cocaine induced vasospasm, reinforced necessity of cocaine abstinence  3. Cocaine use causing #1, as above  4. Mild non-obstructive CAD - continue ASA, simvastatin  If BP ok and no more chest pain he will be ok for discharge in the am.   Signed, Lars Masson, MD, [facility] 05/15/2015, 5:14 PM

## 2015-05-15 NOTE — ED Notes (Addendum)
Patient c/o central chest pain and SOB.  Denies pain radiating.  Pain began when woke this morning and began to move around.  Patient admits to cocaine use x2 days ago.  Denies drug use today, denies alcohol or any other drug use today.  Denies nausea/vomiting.  Per patient seen at [facility] Thanksgiving day for the same with use of cocaine.  Patient is anxious and fidgety in triage.  Breathing even and unlabored.

## 2015-05-16 DIAGNOSIS — I10 Essential (primary) hypertension: Secondary | ICD-10-CM

## 2015-05-16 MED ORDER — AMLODIPINE BESYLATE 2.5 MG PO TABS: 2.5000 mg | ORAL_TABLET | Freq: Every day | ORAL | Status: DC

## 2015-05-16 MED ORDER — LOSARTAN POTASSIUM 25 MG PO TABS: 25.0000 mg | ORAL_TABLET | Freq: Every day | ORAL | Status: DC

## 2015-05-16 NOTE — Discharge Instructions (Signed)
Stimulant Use Disorder-Cocaine  Cocaine is one of a group of powerful drugs called stimulants. Cocaine has medical uses for stopping nosebleeds and for pain control before minor nose or dental surgery. However, cocaine is misused because of the effects that it produces. These effects include:   · A feeling of extreme pleasure.  · Alertness.  · High energy.  Common street names for cocaine include coke, crack, blow, snow, and nose candy. Cocaine is snorted, dissolved in water and injected, or smoked.   Stimulants are addictive because they activate regions of the brain that produce both the pleasurable sensation of "reward" and psychological dependence. Together, these actions account for loss of control and the rapid development of drug dependence. This means you become ill without the drug (withdrawal) and need to keep using it to function.   Stimulant use disorder is use of stimulants that disrupts your daily life. It disrupts relationships with family and friends and how you do your job. Cocaine increases your blood pressure and heart rate. It can cause a heart attack or stroke. Cocaine can also cause death from irregular heart rate or seizures.  SYMPTOMS  Symptoms of stimulant use disorder with cocaine include:  · Use of cocaine in larger amounts or over a longer period of time than intended.  · Unsuccessful attempts to cut down or control cocaine use.  · A lot of time spent obtaining, using, or recovering from the effects of cocaine.  · A strong desire or urge to use cocaine (craving).  · Continued use of cocaine in spite of major problems at work, school, or home because of use.  · Continued use of cocaine in spite of relationship problems because of use.  · Giving up or cutting down on important life activities because of cocaine use.  · Use of cocaine over and over in situations when it is physically hazardous, such as driving a car.  · Continued use of cocaine in spite of a physical problem that is likely  related to use. Physical problems can include:  ¨ Malnutrition.  ¨ Nosebleeds.  ¨ Chest pain.  ¨ High blood pressure.  ¨ A hole that develops between the part of your nose that separates your nostrils (perforated nasal septum).  ¨ Lung and kidney damage.  · Continued use of cocaine in spite of a mental problem that is likely related to use. Mental problems can include:  ¨ Schizophrenia-like symptoms.  ¨ Depression.  ¨ Bipolar mood swings.  ¨ Anxiety.  ¨ Sleep problems.  · Need to use more and more cocaine to get the same effect, or lessened effect over time with use of the same amount of cocaine (tolerance).  · Having withdrawal symptoms when cocaine use is stopped, or using cocaine to reduce or avoid withdrawal symptoms. Withdrawal symptoms include:  ¨ Depressed or irritable mood.  ¨ Low energy or restlessness.  ¨ Bad dreams.  ¨ Poor or excessive sleep.  ¨ Increased appetite.  DIAGNOSIS  Stimulant use disorder is diagnosed by your health care provider. You may be asked questions about your cocaine use and how it affects your life. A physical exam may be done. A drug screen may be ordered. You may be referred to a mental health professional. The diagnosis of stimulant use disorder requires at least two symptoms within 12 months. The type of stimulant use disorder depends on the number of signs and symptoms you have. The type may be:  · Mild. Two or three signs and symptoms.  ·   Moderate. Four or five signs and symptoms.  · Severe. Six or more signs and symptoms.  TREATMENT  Treatment for stimulant use disorder is usually provided by mental health professionals with training in substance use disorders. The following options are available:  · Counseling or talk therapy. Talk therapy addresses the reasons you use cocaine and ways to keep you from using again. Goals of talk therapy include:  ¨ Identifying and avoiding triggers for use.  ¨ Handling cravings.  ¨ Replacing use with healthy activities.  · Support groups.  Support groups provide emotional support, advice, and guidance.  · Medicine. Certain medicines may decrease cocaine cravings or withdrawal symptoms.  HOME CARE INSTRUCTIONS  · Take medicines only as directed by your health care provider.  · Identify the people and activities that trigger your cocaine use and avoid them.  · Keep all follow-up visits as directed by your health care provider.  SEEK MEDICAL CARE IF:  · Your symptoms get worse or you relapse.  · You are not able to take medicines as directed.  SEEK IMMEDIATE MEDICAL CARE IF:  · You have serious thoughts about hurting yourself or others.  · You have a seizure, chest pain, sudden weakness, or loss of speech or vision.  FOR MORE INFORMATION  · National Institute on Drug Abuse: www.drugabuse.gov  · Substance Abuse and Mental Health Services Administration: www.samhsa.gov     This information is not intended to replace advice given to you by your health care provider. Make sure you discuss any questions you have with your health care provider.     Document Released: 05/29/2000 Document Revised: 06/22/2014 Document Reviewed: 06/14/2013  Elsevier Interactive Patient Education ©2016 [employer]
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
## 2015-05-16 NOTE — Care Management Note (Signed)
Case Management Note  Patient Details  Name: Bryce Ortega MRN: [id number] Date of Birth: Nov 22, 1960  Subjective/Objective:  54 y/o m admitted w/NSTEMI. From home.                  Action/Plan:d/c plan home.   Expected Discharge Date:   (unknown)               Expected Discharge Plan:  Home/Self Care  In-House Referral:     Discharge planning Services  CM Consult  Post Acute Care Choice:    Choice offered to:     DME Arranged:    DME Agency:     HH Arranged:    HH Agency:     Status of Service:  Completed, signed off  Medicare Important Message Given:    Date Medicare IM Given:    Medicare IM give by:    Date Additional Medicare IM Given:    Additional Medicare Important Message give by:     If discussed at Long Length of Stay Meetings, dates discussed:    Additional Comments:  Lanier Clam, RN 05/16/2015, 2:06 PM
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
## 2015-05-28 NOTE — ED Provider Notes (Signed)
CSN: [id number]     Arrival date & time 05/10/15  0410 History   First MD Initiated Contact with Patient 05/10/15 [phone]     Chief Complaint  Patient presents with  . Chest Pain     (Consider location/radiation/quality/duration/timing/severity/associated sxs/prior Treatment) HPI Comments: 54 year old male with history of cocaine abuse, hypertension and hyperlipidemia comes in via EMS with cc of chest pain. EMS report to me that patient went into a Vfib requiring shock and CPR x 1 round. PT comes to the ER, alert and awake. He has some chest discomfort right now, pressure like. Pt reports that he woke up in the middle of the night with midsternal chest tightness, heaviness that woke him up from sleep, prompting him to call EMS. Pt denies any palpitations, nausea, vomiting, headache, dizziness, fevers, chills, abdominal pain, bowel or urinary symptoms. + DIB and some diophoresis.   ROS 10 Systems reviewed and are negative for acute change except as noted in the HPI.     Patient is a 54 y.o. male presenting with chest pain. The history is provided by the patient and the EMS personnel.  Chest Pain   Past Medical History  Diagnosis Date  . Hypertension   . High cholesterol    Past Surgical History  Procedure Laterality Date  . Tonsillectomy    . Cardiac catheterization N/A 05/10/2015    Procedure: Left Heart Cath and Coronary Angiography;  Surgeon: Dolores Patty, MD;  Location: [facility] INVASIVE CV LAB;  Service: Cardiovascular;  Laterality: N/A;   Family History  Problem Relation Age of Onset  . Hypertension Father   . Heart disease Father   . Heart disease Mother   . Heart disease Maternal Grandfather    Social History  Substance Use Topics  . Smoking status: Never Smoker   . Smokeless tobacco: None  . Alcohol Use: Yes     Comment: occ    Review of Systems  Cardiovascular: Positive for chest pain.      Allergies  Propoxyphene n-acetaminophen  Home Medications    Prior to Admission medications   Medication Sig Start Date End Date Taking? Authorizing Provider  amLODipine (NORVASC) 2.5 MG tablet Take 1 tablet (2.5 mg total) by mouth daily. 05/16/15   Nishant Dhungel, MD  aspirin EC 81 MG tablet Take 1 tablet (81 mg total) by mouth daily. Patient not taking: Reported on 05/15/2015 05/11/15   Maretta Bees, MD  losartan (COZAAR) 25 MG tablet Take 1 tablet (25 mg total) by mouth daily. 05/16/15   Nishant Dhungel, MD  nitroGLYCERIN (NITROSTAT) 0.4 MG SL tablet Place 1 tablet (0.4 mg total) under the tongue every 5 (five) minutes as needed for chest pain (for 3 doses). Patient not taking: Reported on 05/15/2015 05/11/15   Maretta Bees, MD  simvastatin (ZOCOR) 20 MG tablet Take 1 tablet (20 mg total) by mouth daily at 6 PM. 05/11/15   Shanker Levora Dredge, MD   BP 133/97 mmHg  Pulse 64  Temp(Src) 97.6 F (36.4 C) (Oral)  Resp 18  Ht  (1.803 m)  Wt 156 lb 4.9 oz (70.9 kg)  BMI 21.81 kg/m2  SpO2 95% Physical Exam  Constitutional: He is oriented to person, place, and time. He appears well-developed.  HENT:  Head: Normocephalic and atraumatic.  Eyes: Conjunctivae and EOM are normal. Pupils are equal, round, and reactive to light.  Neck: Normal range of motion. Neck supple.  Cardiovascular: Normal rate, regular rhythm, normal heart sounds and intact distal  pulses.   Pulmonary/Chest: Effort normal and breath sounds normal. No respiratory distress. He has no wheezes.  Abdominal: Soft. Bowel sounds are normal. He exhibits no distension. There is no tenderness. There is no rebound and no guarding.  Neurological: He is alert and oriented to person, place, and time.  Skin: Skin is warm.  Nursing note and vitals reviewed.   ED Course  Procedures (including critical care time) Labs Review Labs Reviewed  MRSA PCR SCREENING - Abnormal; Notable for the following:    MRSA by PCR POSITIVE (*)    All other components within normal limits  BASIC  METABOLIC PANEL - Abnormal; Notable for the following:    Potassium 3.2 (*)    CO2 19 (*)    Glucose, Bld 181 (*)    Creatinine, Ser 1.46 (*)    Calcium 8.5 (*)    GFR calc non Af Amer 53 (*)    All other components within normal limits  URINE RAPID DRUG SCREEN, HOSP PERFORMED - Abnormal; Notable for the following:    Cocaine POSITIVE (*)    All other components within normal limits  CBC - Abnormal; Notable for the following:    WBC 13.4 (*)    All other components within normal limits  COMPREHENSIVE METABOLIC PANEL - Abnormal; Notable for the following:    Glucose, Bld 122 (*)    Calcium 8.6 (*)    Total Protein 5.6 (*)    Albumin 3.4 (*)    AST 46 (*)    Anion gap 4 (*)    All other components within normal limits  TROPONIN I - Abnormal; Notable for the following:    Troponin I 0.15 (*)    All other components within normal limits  TROPONIN I - Abnormal; Notable for the following:    Troponin I 0.19 (*)    All other components within normal limits  BASIC METABOLIC PANEL - Abnormal; Notable for the following:    Glucose, Bld 111 (*)    Creatinine, Ser 1.26 (*)    Calcium 8.6 (*)    All other components within normal limits  CBG MONITORING, ED - Abnormal; Notable for the following:    Glucose-Capillary 156 (*)    All other components within normal limits  CBC  MAGNESIUM  TROPONIN I  MAGNESIUM  CBC  I-STAT TROPOININ, ED    Imaging Review No results found. I have personally reviewed and evaluated these images and lab results as part of my medical decision-making.   EKG Interpretation   Date/Time:  Friday May 10 2015 04:17:41 EST Ventricular Rate:  79 PR Interval:  173 QRS Duration: 74 QT Interval:  393 QTC Calculation: 450 R Axis:   69 Text Interpretation:  Sinus rhythm Abnormal R-wave progression, early  transition No old tracing to compare No acute changes Confirmed by  Rhunette Croft, MD, Janey Genta [phone]) on 05/10/2015 5:05:20 AM      MDM   Final  diagnoses:  Cocaine abuse  Cardiac arrest (HCC)  ACS (acute coronary syndrome) (HCC)     CRITICAL CARE Performed by: Derwood Kaplan   Total critical care time: 50 minutes  Critical care time was exclusive of separately billable procedures and treating other patients.  Critical care was necessary to treat or prevent imminent or life-threatening deterioration.  Critical care was time spent personally by me on the following activities: development of treatment plan with patient and/or surrogate as well as nursing, discussions with consultants, evaluation of patient's response to treatment, examination of  patient, obtaining history from patient or surrogate, ordering and performing treatments and interventions, ordering and review of laboratory studies, ordering and review of radiographic studies, pulse oximetry and re-evaluation of patient's condition.   PT comes in with chest pain and cardiac arrest. I reviewed the EKG from the EMS team - and indeed there is a wide complex, polymorphic rhythm - and they responded appropriately in the setting of patient being unresponsive. Pt is remarkably ao x 3 right now. Still some discomfort, but the EKG has no STEMI. Cards consulted - and they will take patient to the cath lab.    The patient was counseled on the dangers of tobacco use, and was advised to quit.  Reviewed strategies to maximize success, including couselling, removing cigarettes from the household and being careful around people who encourage this vice.     Derwood Kaplan, MD 05/28/15 2253

## 2015-10-14 ENCOUNTER — Emergency Department (HOSPITAL_COMMUNITY)
Admission: EM | Admit: 2015-10-14 | Discharge: 2015-10-14 | Disposition: A | Discharge status: Home / Self Care | Payer: Self-pay | Attending: Emergency Medicine | Admitting: Emergency Medicine

## 2015-10-14 ENCOUNTER — Emergency Department (HOSPITAL_COMMUNITY): Payer: Self-pay

## 2015-10-14 ENCOUNTER — Encounter (HOSPITAL_COMMUNITY): Payer: Self-pay | Admitting: Vascular Surgery

## 2015-10-14 DIAGNOSIS — F1310 Sedative, hypnotic or anxiolytic abuse, uncomplicated: Secondary | ICD-10-CM | POA: Insufficient documentation

## 2015-10-14 DIAGNOSIS — Z9889 Other specified postprocedural states: Secondary | ICD-10-CM | POA: Insufficient documentation

## 2015-10-14 DIAGNOSIS — Z7982 Long term (current) use of aspirin: Secondary | ICD-10-CM | POA: Insufficient documentation

## 2015-10-14 DIAGNOSIS — Z79899 Other long term (current) drug therapy: Secondary | ICD-10-CM | POA: Insufficient documentation

## 2015-10-14 DIAGNOSIS — E7800 Pure hypercholesterolemia, unspecified: Secondary | ICD-10-CM | POA: Insufficient documentation

## 2015-10-14 DIAGNOSIS — R079 Chest pain, unspecified: Secondary | ICD-10-CM | POA: Insufficient documentation

## 2015-10-14 DIAGNOSIS — F1910 Other psychoactive substance abuse, uncomplicated: Secondary | ICD-10-CM

## 2015-10-14 DIAGNOSIS — R110 Nausea: Secondary | ICD-10-CM | POA: Insufficient documentation

## 2015-10-14 DIAGNOSIS — R0602 Shortness of breath: Secondary | ICD-10-CM | POA: Insufficient documentation

## 2015-10-14 DIAGNOSIS — I10 Essential (primary) hypertension: Secondary | ICD-10-CM | POA: Insufficient documentation

## 2015-10-14 DIAGNOSIS — R05 Cough: Secondary | ICD-10-CM | POA: Insufficient documentation

## 2015-10-14 DIAGNOSIS — F1410 Cocaine abuse, uncomplicated: Secondary | ICD-10-CM | POA: Insufficient documentation

## 2015-10-14 LAB — BASIC METABOLIC PANEL
ANION GAP: 9 (ref 5–15)
BUN: 17 mg/dL (ref 6–20)
CALCIUM: 8.6 mg/dL — AB (ref 8.9–10.3)
CO2: 25 mmol/L (ref 22–32)
Chloride: 108 mmol/L (ref 101–111)
Creatinine, Ser: 1.32 mg/dL — ABNORMAL HIGH (ref 0.61–1.24)
GFR calc Af Amer: >60 mL/min (ref >60)
GFR, EST NON AFRICAN AMERICAN: 60 mL/min — AB (ref >60)
GLUCOSE: 98 mg/dL (ref 65–99)
Potassium: 3.9 mmol/L (ref 3.5–5.1)
Sodium: 142 mmol/L (ref 135–145)

## 2015-10-14 LAB — RAPID URINE DRUG SCREEN, HOSP PERFORMED
Amphetamines: NOT DETECTED
BARBITURATES: NOT DETECTED
Benzodiazepines: POSITIVE — AB
COCAINE: POSITIVE — AB
Opiates: NOT DETECTED
Tetrahydrocannabinol: NOT DETECTED

## 2015-10-14 LAB — CBC
HEMATOCRIT: 44.8 % (ref 39.0–52.0)
HEMOGLOBIN: 14.8 g/dL (ref 13.0–17.0)
MCH: 30.3 pg (ref 26.0–34.0)
MCHC: 33 g/dL (ref 30.0–36.0)
MCV: 91.6 fL (ref 78.0–100.0)
Platelets: 272 10*3/uL (ref 150–400)
RBC: 4.89 MIL/uL (ref 4.22–5.81)
RDW: 12.9 % (ref 11.5–15.5)
WBC: 5.5 10*3/uL (ref 4.0–10.5)

## 2015-10-14 LAB — I-STAT TROPONIN, ED
TROPONIN I, POC: 0 ng/mL (ref 0.00–0.08)
TROPONIN I, POC: 0 ng/mL (ref 0.00–0.08)

## 2015-10-14 MED ORDER — NITROGLYCERIN 0.4 MG SL SUBL: 0.4000 mg | SUBLINGUAL_TABLET | SUBLINGUAL | Status: DC | PRN

## 2015-10-14 NOTE — ED Provider Notes (Signed)
19:28- reevaluation after delta troponin.  Medications  nitroGLYCERIN (NITROSTAT) SL tablet 0.4 mg (not administered)    Patient Vitals for the past 24 hrs:  BP Temp Temp src Pulse Resp SpO2  10/14/15 1900 128/91 mmHg - - 61 20 100 %  10/14/15 1830 121/76 mmHg - - (!) 58 16 97 %  10/14/15 1745 136/82 mmHg - - (!) 56 18 100 %  10/14/15 1715 126/92 mmHg - - (!) 55 14 99 %  10/14/15 1645 119/84 mmHg - - (!) 58 14 99 %  10/14/15 1615 119/88 mmHg - - (!) 54 15 99 %  10/14/15 1545 118/82 mmHg - - (!) 57 15 99 %  10/14/15 1345 110/74 mmHg - - (!) 58 15 97 %  10/14/15 1330 115/78 mmHg - - 63 16 97 %  10/14/15 1327 113/75 mmHg 98.6 F (37 C) Oral 63 16 97 %   Dg Chest 2 View  10/14/2015  CLINICAL DATA:  Acute onset of left-sided chest pain today he power. Radiation of pain to the left shoulder. EXAM: CHEST  2 VIEW COMPARISON:  05/15/2015. FINDINGS: The cardiac silhouette, mediastinal and hilar contours are within normal limits and stable. The lungs are clear. No pleural effusion or pneumothorax. The bony thorax is intact. IMPRESSION: No acute cardiopulmonary findings. Electronically Signed   By: Rudie Meyer M.D.   On: 10/14/2015 14:15    Results for orders placed or performed during the hospital encounter of 10/14/15  Basic metabolic panel  Result Value Ref Range   Sodium 142 135 - 145 mmol/L   Potassium 3.9 3.5 - 5.1 mmol/L   Chloride 108 101 - 111 mmol/L   CO2 25 22 - 32 mmol/L   Glucose, Bld 98 65 - 99 mg/dL   BUN 17 6 - 20 mg/dL   Creatinine, Ser 2.39 (H) 0.61 - 1.24 mg/dL   Calcium 8.6 (L) 8.9 - 10.3 mg/dL   GFR calc non Af Amer 60 (L) >60 mL/min   GFR calc Af Amer >60 >60 mL/min   Anion gap 9 5 - 15  CBC  Result Value Ref Range   WBC 5.5 4.0 - 10.5 K/uL   RBC 4.89 4.22 - 5.81 MIL/uL   Hemoglobin 14.8 13.0 - 17.0 g/dL   HCT 53.2 02.3 - 34.3 %   MCV 91.6 78.0 - 100.0 fL   MCH 30.3 26.0 - 34.0 pg   MCHC 33.0 30.0 - 36.0 g/dL   RDW 56.8 61.6 - 83.7 %   Platelets 272 150 -  400 K/uL  Urine rapid drug screen (hosp performed)  Result Value Ref Range   Opiates NONE DETECTED NONE DETECTED   Cocaine POSITIVE (A) NONE DETECTED   Benzodiazepines POSITIVE (A) NONE DETECTED   Amphetamines NONE DETECTED NONE DETECTED   Tetrahydrocannabinol NONE DETECTED NONE DETECTED   Barbiturates NONE DETECTED NONE DETECTED  I-stat troponin, ED  Result Value Ref Range   Troponin i, poc 0.00 0.00 - 0.08 ng/mL   Comment 3          I-stat troponin, ED  Result Value Ref Range   Troponin i, poc 0.00 0.00 - 0.08 ng/mL   Comment 3              7:29 PM Reevaluation with update and discussion. After initial assessment and treatment, an updated evaluation reveals  patient is comfortable now and states that he is not having chest pain. He was at work earlier today when the pain started, working as  a Biomedical scientist at [employer]. Baldwin Racicot L    Medical decision-making- nonspecific chest pain, with ongoing use of cocaine. Doubt ACS, PE or PNE.  Nursing Notes Reviewed/ Care Coordinated Applicable Imaging Reviewed Interpretation of Laboratory Data incorporated into ED treatment  The patient appears reasonably screened and/or stabilized for discharge and I doubt any other medical condition or other [facility] requiring further screening, evaluation, or treatment in the ED at this time prior to discharge.  Plan: Home Medications- APAP; Home Treatments- stop cocaine; return here if the recommended treatment, does not improve the symptoms; Recommended follow up- PCP prn   Mancel Bale, MD 10/14/15 [phone]

## 2015-10-14 NOTE — Discharge Instructions (Signed)
Nonspecific Chest Pain  °Chest pain can be caused by many different conditions. There is always a chance that your pain could be related to something serious, such as a heart attack or a blood clot in your lungs. Chest pain can also be caused by conditions that are not life-threatening. If you have chest pain, it is very important to follow up with your health care provider. °CAUSES  °Chest pain can be caused by: °· Heartburn. °· Pneumonia or bronchitis. °· Anxiety or stress. °· Inflammation around your heart (pericarditis) or lung (pleuritis or pleurisy). °· A blood clot in your lung. °· A collapsed lung (pneumothorax). It can develop suddenly on its own (spontaneous pneumothorax) or from trauma to the chest. °· Shingles infection (varicella-zoster virus). °· Heart attack. °· Damage to the bones, muscles, and cartilage that make up your chest wall. This can include: °· Bruised bones due to injury. °· Strained muscles or cartilage due to frequent or repeated coughing or overwork. °· Fracture to one or more ribs. °· Sore cartilage due to inflammation (costochondritis). °RISK FACTORS  °Risk factors for chest pain may include: °· Activities that increase your risk for trauma or injury to your chest. °· Respiratory infections or conditions that cause frequent coughing. °· Medical conditions or overeating that can cause heartburn. °· Heart disease or family history of heart disease. °· Conditions or health behaviors that increase your risk of developing a blood clot. °· Having had chicken pox (varicella zoster). °SIGNS AND SYMPTOMS °Chest pain can feel like: °· Burning or tingling on the surface of your chest or deep in your chest. °· Crushing, pressure, aching, or squeezing pain. °· Dull or sharp pain that is worse when you move, cough, or take a deep breath. °· Pain that is also felt in your back, neck, shoulder, or arm, or pain that spreads to any of these areas. °Your chest pain may come and go, or it may stay  constant. °DIAGNOSIS °Lab tests or other studies may be needed to find the cause of your pain. Your health care provider may have you take a test called an ambulatory ECG (electrocardiogram). An ECG records your heartbeat patterns at the time the test is performed. You may also have other tests, such as: °· Transthoracic echocardiogram (TTE). During echocardiography, sound waves are used to create a picture of all of the heart structures and to look at how blood flows through your heart. °· Transesophageal echocardiogram (TEE). This is a more advanced imaging test that obtains images from inside your body. It allows your health care provider to see your heart in finer detail. °· Cardiac monitoring. This allows your health care provider to monitor your heart rate and rhythm in real time. °· Holter monitor. This is a portable device that records your heartbeat and can help to diagnose abnormal heartbeats. It allows your health care provider to track your heart activity for several days, if needed. °· Stress tests. These can be done through exercise or by taking medicine that makes your heart beat more quickly. °· Blood tests. °· Imaging tests. °TREATMENT  °Your treatment depends on what is causing your chest pain. Treatment may include: °· Medicines. These may include: °· Acid blockers for heartburn. °· Anti-inflammatory medicine. °· Pain medicine for inflammatory conditions. °· Antibiotic medicine, if an infection is present. °· Medicines to dissolve blood clots. °· Medicines to treat coronary artery disease. °· Supportive care for conditions that do not require medicines. This may include: °· Resting. °· Applying heat   or cold packs to injured areas. °· Limiting activities until pain decreases. °HOME CARE INSTRUCTIONS °· If you were prescribed an antibiotic medicine, finish it all even if you start to feel better. °· Avoid any activities that bring on chest pain. °· Do not use any tobacco products, including  cigarettes, chewing tobacco, or electronic cigarettes. If you need help quitting, ask your health care provider. °· Do not drink alcohol. °· Take medicines only as directed by your health care provider. °· Keep all follow-up visits as directed by your health care provider. This is important. This includes any further testing if your chest pain does not go away. °· If heartburn is the cause for your chest pain, you may be told to keep your head raised (elevated) while sleeping. This reduces the chance that acid will go from your stomach into your esophagus. °· Make lifestyle changes as directed by your health care provider. These may include: °· Getting regular exercise. Ask your health care provider to suggest some activities that are safe for you. °· Eating a heart-healthy diet. A registered dietitian can help you to learn healthy eating options. °· Maintaining a healthy weight. °· Managing diabetes, if necessary. °· Reducing stress. °SEEK MEDICAL CARE IF: °· Your chest pain does not go away after treatment. °· You have a rash with blisters on your chest. °· You have a fever. °SEEK IMMEDIATE MEDICAL CARE IF:  °· Your chest pain is worse. °· You have an increasing cough, or you cough up blood. °· You have severe abdominal pain. °· You have severe weakness. °· You faint. °· You have chills. °· You have sudden, unexplained chest discomfort. °· You have sudden, unexplained discomfort in your arms, back, neck, or jaw. °· You have shortness of breath at any time. °· You suddenly start to sweat, or your skin gets clammy. °· You feel nauseous or you vomit. °· You suddenly feel light-headed or dizzy. °· Your heart begins to beat quickly, or it feels like it is skipping beats. °These symptoms may represent a serious problem that is an emergency. Do not wait to see if the symptoms will go away. Get medical help right away. Call your local emergency services (911 in the [place] Do not drive yourself to the hospital. °  °This  information is not intended to replace advice given to you by your health care provider. Make sure you discuss any questions you have with your health care provider. °  °Document Released: 03/11/2005 Document Revised: 06/22/2014 Document Reviewed: 01/05/2014 °Elsevier Interactive Patient Education ©2016 [employer] ° °Stimulant Use Disorder-Cocaine °Cocaine is one of a group of powerful drugs called stimulants. Cocaine has medical uses for stopping nosebleeds and for pain control before minor nose or dental surgery. However, cocaine is misused because of the effects that it produces. These effects include:  °· A feeling of extreme pleasure. °· Alertness. °· High energy. °Common street names for cocaine include coke, crack, blow, snow, and nose candy. Cocaine is snorted, dissolved in water and injected, or smoked.  °Stimulants are addictive because they activate regions of the brain that produce both the pleasurable sensation of "reward" and psychological dependence. Together, these actions account for loss of control and the rapid development of drug dependence. This means you become ill without the drug (withdrawal) and need to keep using it to function.  °Stimulant use disorder is use of stimulants that disrupts your daily life. It disrupts relationships with family and friends and how you do your   job. Cocaine increases your blood pressure and heart rate. It can cause a heart attack or stroke. Cocaine can also cause death from irregular heart rate or seizures. °SYMPTOMS °Symptoms of stimulant use disorder with cocaine include: °· Use of cocaine in larger amounts or over a longer period of time than intended. °· Unsuccessful attempts to cut down or control cocaine use. °· A lot of time spent obtaining, using, or recovering from the effects of cocaine. °· A strong desire or urge to use cocaine (craving). °· Continued use of cocaine in spite of major problems at work, school, or home because of use. °· Continued  use of cocaine in spite of relationship problems because of use. °· Giving up or cutting down on important life activities because of cocaine use. °· Use of cocaine over and over in situations when it is physically hazardous, such as driving a car. °· Continued use of cocaine in spite of a physical problem that is likely related to use. Physical problems can include: °¨ Malnutrition. °¨ Nosebleeds. °¨ Chest pain. °¨ High blood pressure. °¨ A hole that develops between the part of your nose that separates your nostrils (perforated nasal septum). °¨ Lung and kidney damage. °· Continued use of cocaine in spite of a mental problem that is likely related to use. Mental problems can include: °¨ Schizophrenia-like symptoms. °¨ Depression. °¨ Bipolar mood swings. °¨ Anxiety. °¨ Sleep problems. °· Need to use more and more cocaine to get the same effect, or lessened effect over time with use of the same amount of cocaine (tolerance). °· Having withdrawal symptoms when cocaine use is stopped, or using cocaine to reduce or avoid withdrawal symptoms. Withdrawal symptoms include: °¨ Depressed or irritable mood. °¨ Low energy or restlessness. °¨ Bad dreams. °¨ Poor or excessive sleep. °¨ Increased appetite. °DIAGNOSIS °Stimulant use disorder is diagnosed by your health care provider. You may be asked questions about your cocaine use and how it affects your life. A physical exam may be done. A drug screen may be ordered. You may be referred to a mental health professional. The diagnosis of stimulant use disorder requires at least two symptoms within 12 months. The type of stimulant use disorder depends on the number of signs and symptoms you have. The type may be: °· Mild. Two or three signs and symptoms. °· Moderate. Four or five signs and symptoms. °· Severe. Six or more signs and symptoms. °TREATMENT °Treatment for stimulant use disorder is usually provided by mental health professionals with training in substance use  disorders. The following options are available: °· Counseling or talk therapy. Talk therapy addresses the reasons you use cocaine and ways to keep you from using again. Goals of talk therapy include: °¨ Identifying and avoiding triggers for use. °¨ Handling cravings. °¨ Replacing use with healthy activities. °· Support groups. Support groups provide emotional support, advice, and guidance. °· Medicine. Certain medicines may decrease cocaine cravings or withdrawal symptoms. °HOME CARE INSTRUCTIONS °· Take medicines only as directed by your health care provider. °· Identify the people and activities that trigger your cocaine use and avoid them. °· Keep all follow-up visits as directed by your health care provider. °SEEK MEDICAL CARE IF: °· Your symptoms get worse or you relapse. °· You are not able to take medicines as directed. °SEEK IMMEDIATE MEDICAL CARE IF: °· You have serious thoughts about hurting yourself or others. °· You have a seizure, chest pain, sudden weakness, or loss of speech or vision. °FOR MORE   INFORMATION °· National Institute on Drug Abuse: www.drugabuse.gov °· Substance Abuse and Mental Health Services Administration: www.samhsa.gov °  °This information is not intended to replace advice given to you by your health care provider. Make sure you discuss any questions you have with your health care provider. °  °Document Released: 05/29/2000 Document Revised: 06/22/2014 Document Reviewed: 06/14/2013 °Elsevier Interactive Patient Education ©2016 [employer] ° °

## 2015-10-14 NOTE — ED Provider Notes (Addendum)
CSN: [id number]     Arrival date & time 10/14/15  1322 History   First MD Initiated Contact with Patient 10/14/15 1329     Chief Complaint  Patient presents with  . Chest Pain      Patient is a 54 y.o. male presenting with chest pain. The history is provided by the patient.  Chest Pain Associated symptoms: cough, nausea and shortness of breath   Associated symptoms: no abdominal pain, no back pain, no headache, no numbness, not vomiting and no weakness    patient presents with chest pain. It is dull and on the left side of the chest. Began today while at work. Around 6 months ago at admission for non-STEMI due to vasospasm secondary cocaine. Last used cocaine a few days ago. States did not use today. No lightheadedness. Slight nausea. No diaphoresis. He has an occasional cough. Pain is worse with breathing also. Some mild relief with the pain with nitroglycerin.  Past Medical History  Diagnosis Date  . Hypertension   . High cholesterol    Past Surgical History  Procedure Laterality Date  . Tonsillectomy    . Cardiac catheterization N/A 05/10/2015    Procedure: Left Heart Cath and Coronary Angiography;  Surgeon: Dolores Patty, MD;  Location: [facility] INVASIVE CV LAB;  Service: Cardiovascular;  Laterality: N/A;   Family History  Problem Relation Age of Onset  . Hypertension Father   . Heart disease Father   . Heart disease Mother   . Heart disease Maternal Grandfather    Social History  Substance Use Topics  . Smoking status: Never Smoker   . Smokeless tobacco: None  . Alcohol Use: Yes     Comment: occ    Review of Systems  Constitutional: Negative for activity change and appetite change.  Eyes: Negative for pain.  Respiratory: Positive for cough and shortness of breath. Negative for chest tightness.   Cardiovascular: Positive for chest pain. Negative for leg swelling.  Gastrointestinal: Positive for nausea. Negative for vomiting, abdominal pain and diarrhea.   Genitourinary: Negative for flank pain.  Musculoskeletal: Negative for back pain and neck stiffness.  Skin: Negative for rash.  Neurological: Negative for weakness, numbness and headaches.  Psychiatric/Behavioral: Negative for behavioral problems.      Allergies  Propoxyphene n-acetaminophen  Home Medications   Prior to Admission medications   Medication Sig Start Date End Date Taking? Authorizing Provider  amLODipine (NORVASC) 2.5 MG tablet Take 1 tablet (2.5 mg total) by mouth daily. 05/16/15  Yes Nishant Dhungel, MD  aspirin EC 81 MG tablet Take 1 tablet (81 mg total) by mouth daily. 05/11/15  Yes Shanker Levora Dredge, MD  losartan (COZAAR) 25 MG tablet Take 1 tablet (25 mg total) by mouth daily. 05/16/15  Yes Nishant Dhungel, MD  simvastatin (ZOCOR) 20 MG tablet Take 1 tablet (20 mg total) by mouth daily at 6 PM. 05/11/15  Yes Shanker Levora Dredge, MD  nitroGLYCERIN (NITROSTAT) 0.4 MG SL tablet Place 1 tablet (0.4 mg total) under the tongue every 5 (five) minutes as needed for chest pain (for 3 doses). Patient not taking: Reported on 05/15/2015 05/11/15   Maretta Bees, MD   BP 118/82 mmHg  Pulse 57  Temp(Src) 98.6 F (37 C) (Oral)  Resp 15  SpO2 99% Physical Exam  Constitutional: He is oriented to person, place, and time. He appears well-developed and well-nourished.  HENT:  Head: Normocephalic and atraumatic.  Eyes: EOM are normal. Pupils are equal, round, and reactive to  light.  Neck: Normal range of motion. Neck supple.  Cardiovascular: Normal rate, regular rhythm and normal heart sounds.   No murmur heard. Pulmonary/Chest: Effort normal and breath sounds normal.  Abdominal: Soft. Bowel sounds are normal. He exhibits no distension and no mass. There is no tenderness. There is no rebound and no guarding.  Musculoskeletal: Normal range of motion. He exhibits no edema.  Neurological: He is alert and oriented to person, place, and time. No cranial nerve deficit.  Skin: Skin  is warm.  Nursing note and vitals reviewed.   ED Course  Procedures (including critical care time) Labs Review Labs Reviewed  BASIC METABOLIC PANEL - Abnormal; Notable for the following:    Creatinine, Ser 1.32 (*)    Calcium 8.6 (*)    GFR calc non Af Amer 60 (*)    All other components within normal limits  URINE RAPID DRUG SCREEN, HOSP PERFORMED - Abnormal; Notable for the following:    Cocaine POSITIVE (*)    Benzodiazepines POSITIVE (*)    All other components within normal limits  CBC  I-STAT TROPOININ, ED    Imaging Review Dg Chest 2 View  10/14/2015  CLINICAL DATA:  Acute onset of left-sided chest pain today he power. Radiation of pain to the left shoulder. EXAM: CHEST  2 VIEW COMPARISON:  05/15/2015. FINDINGS: The cardiac silhouette, mediastinal and hilar contours are within normal limits and stable. The lungs are clear. No pleural effusion or pneumothorax. The bony thorax is intact. IMPRESSION: No acute cardiopulmonary findings. Electronically Signed   By: Rudie Meyer M.D.   On: 10/14/2015 14:15   I have personally reviewed and evaluated these images and lab results as part of my medical decision-making.   EKG Interpretation   Date/Time:  Monday Oct 14 2015 13:26:18 EDT Ventricular Rate:  65 PR Interval:  158 QRS Duration: 87 QT Interval:  403 QTC Calculation: 419 R Axis:   28 Text Interpretation:  Sinus rhythm Confirmed by Rubin Payor  MD, Harrold Donath  [phone]) on 10/14/2015 1:41:28 PM      MDM   Final diagnoses:  Chest pain, unspecified chest pain type    Patient with chest pain with recent non-STEMI. Cocaine-induced vasospasm at that time. Had cath. No real active disease at that time. Cath was around 6 months ago. EKG reassuring. Initial troponin negative. Patient is somewhat sleepy. Awaiting drug screen. Likely discharge home after second troponin.    Benjiman Core, MD 10/14/15 1553  Patient's drug screen is positive for cocaine and benzos. Does not  appear to be prescribed benzos. We'll repeat troponin and await patient to become a little more awake.  Benjiman Core, MD 10/14/15 [phone]

## 2015-10-14 NOTE — ED Notes (Signed)
Pt reports to the ED for eval of sudden onset left sided CP that radiates into his left shoulder. Began this am while he was at work. Reports associated SOB. Denies any other symptoms. Per pt 6 months ago he had similar symptoms and was told his EKG showed an old MI. No stents were placed. Hx of cocaine abuse. Last use 1 week ago. Had 324 of ASA and 1 nitro which took his pain from an 7/10 to a 5/10. Pt lethargic but denies any other drug use. Pt arousable to verbal stimuli. 12 lead en route unremarkable. Resp e/u and skin warm and dry.

## 2015-10-14 NOTE — ED Notes (Signed)
Pt. Given meal and drink with EDP approval.

## 2016-01-19 ENCOUNTER — Emergency Department (HOSPITAL_COMMUNITY)
Admission: EM | Admit: 2016-01-19 | Discharge: 2016-01-19 | Disposition: A | Discharge status: Home / Self Care | Payer: Worker's Compensation | Attending: Emergency Medicine | Admitting: Emergency Medicine

## 2016-01-19 ENCOUNTER — Encounter (HOSPITAL_COMMUNITY): Payer: Self-pay | Admitting: *Deleted

## 2016-01-19 DIAGNOSIS — Y999 Unspecified external cause status: Secondary | ICD-10-CM | POA: Diagnosis not present

## 2016-01-19 DIAGNOSIS — Z23 Encounter for immunization: Secondary | ICD-10-CM | POA: Insufficient documentation

## 2016-01-19 DIAGNOSIS — W260XXA Contact with knife, initial encounter: Secondary | ICD-10-CM | POA: Insufficient documentation

## 2016-01-19 DIAGNOSIS — I10 Essential (primary) hypertension: Secondary | ICD-10-CM | POA: Insufficient documentation

## 2016-01-19 DIAGNOSIS — I252 Old myocardial infarction: Secondary | ICD-10-CM | POA: Diagnosis not present

## 2016-01-19 DIAGNOSIS — Z7982 Long term (current) use of aspirin: Secondary | ICD-10-CM | POA: Diagnosis not present

## 2016-01-19 DIAGNOSIS — Y939 Activity, unspecified: Secondary | ICD-10-CM | POA: Diagnosis not present

## 2016-01-19 DIAGNOSIS — S61213A Laceration without foreign body of left middle finger without damage to nail, initial encounter: Secondary | ICD-10-CM | POA: Diagnosis not present

## 2016-01-19 DIAGNOSIS — Y9289 Other specified places as the place of occurrence of the external cause: Secondary | ICD-10-CM | POA: Diagnosis not present

## 2016-01-19 DIAGNOSIS — S61219A Laceration without foreign body of unspecified finger without damage to nail, initial encounter: Secondary | ICD-10-CM

## 2016-01-19 MED ORDER — BUPIVACAINE HCL (PF) 0.5 % IJ SOLN
10.0000 mL | Freq: Once | INTRAMUSCULAR | Status: AC
  Administered 2016-01-19: 10 mL
  Filled 2016-01-19: qty 10

## 2016-01-19 MED ORDER — TETANUS-DIPHTH-ACELL PERTUSSIS 5-2.5-18.5 LF-MCG/0.5 IM SUSP
0.5000 mL | Freq: Once | INTRAMUSCULAR | Status: AC
  Administered 2016-01-19: 0.5 mL via INTRAMUSCULAR
  Filled 2016-01-19: qty 0.5

## 2016-01-19 NOTE — ED Triage Notes (Signed)
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 Bandage applied at triage.

## 2016-01-19 NOTE — ED Notes (Signed)
Patient Alert and oriented X4. Stable and ambulatory. Patient verbalized understanding of the discharge instructions.  Patient belongings were taken by the patient.  

## 2016-01-19 NOTE — ED Provider Notes (Signed)
[facility]-EMERGENCY DEPT Provider Note   CSN: [id number] Arrival date & time: 01/19/16  1806  First Provider Contact:  First MD Initiated Contact with Patient 01/19/16 1841        History   Chief Complaint Chief Complaint  Patient presents with  . Laceration    HPI Bryce Ortega is a 55 y.o. male.  He accidentally cut himself a knife that he dropped. Isolated injury, left hand finger 3. No preceding symptoms. Unknown tetanus status. No other injuries. There are no other known modifying factors.  HPI  Past Medical History:  Diagnosis Date  . High cholesterol   . Hypertension     Patient Active Problem List   Diagnosis Date Noted  . NSTEMI (non-ST elevated myocardial infarction) (HCC) 05/15/2015  . Cocaine abuse 05/15/2015  . Chest pain at rest   . Cardiac arrest (HCC) 05/10/2015  . Hyperlipidemia 05/10/2015  . Essential hypertension 01/10/2009  . SHOULDER PAIN, RIGHT 01/10/2009  . CERVICALGIA 01/10/2009  . LOW BACK PAIN 01/10/2009    Past Surgical History:  Procedure Laterality Date  . CARDIAC CATHETERIZATION N/A 05/10/2015   Procedure: Left Heart Cath and Coronary Angiography;  Surgeon: Dolores Patty, MD;  Location: [facility] INVASIVE CV LAB;  Service: Cardiovascular;  Laterality: N/A;  . TONSILLECTOMY         Home Medications    Prior to Admission medications   Medication Sig Start Date End Date Taking? Authorizing Provider  amLODipine (NORVASC) 2.5 MG tablet Take 1 tablet (2.5 mg total) by mouth daily. 05/16/15   Nishant Dhungel, MD  aspirin EC 81 MG tablet Take 1 tablet (81 mg total) by mouth daily. 05/11/15   Shanker Levora Dredge, MD  losartan (COZAAR) 25 MG tablet Take 1 tablet (25 mg total) by mouth daily. 05/16/15   Nishant Dhungel, MD  nitroGLYCERIN (NITROSTAT) 0.4 MG SL tablet Place 1 tablet (0.4 mg total) under the tongue every 5 (five) minutes as needed for chest pain (for 3 doses). Patient not taking: Reported on 05/15/2015 05/11/15   Maretta Bees,  MD  simvastatin (ZOCOR) 20 MG tablet Take 1 tablet (20 mg total) by mouth daily at 6 PM. 05/11/15   Maretta Bees, MD    Family History Family History  Problem Relation Age of Onset  . Hypertension Father   . Heart disease Father   . Heart disease Mother   . Heart disease Maternal Grandfather     Social History Social History  Substance Use Topics  . Smoking status: Never Smoker  . Smokeless tobacco: Not on file  . Alcohol use Yes     Comment: occ     Allergies   Propoxyphene n-acetaminophen   Review of Systems Review of Systems  All other systems reviewed and are negative.    Physical Exam Updated Vital Signs BP 133/89 (BP Location: Right Arm)   Pulse 80   Temp 98.3 F (36.8 C) (Oral)   Resp 18   SpO2 98%   Physical Exam  Constitutional: He is oriented to person, place, and time. He appears well-developed and well-nourished.  HENT:  Head: Normocephalic and atraumatic.  Right Ear: External ear normal.  Left Ear: External ear normal.  Eyes: Conjunctivae and EOM are normal.  Neck: Normal range of motion and phonation normal. Neck supple.  Cardiovascular: Normal rate.   Pulmonary/Chest: Effort normal. He exhibits no bony tenderness.  Musculoskeletal: Normal range of motion.  Normal range of motion. Left finger, 3  Neurological: He is alert  and oriented to person, place, and time. No cranial nerve deficit or sensory deficit. He exhibits normal muscle tone. Coordination normal.  Normal sensation distal left finger, 3  Skin: Skin is warm, dry and intact.  Left finger 3, with proximal-based flap of the pad, volar aspect. Laceration is distal to the flexion crease. No nail cuticle or nailbed injury  Psychiatric: He has a normal mood and affect. His behavior is normal. Judgment and thought content normal.  Nursing note and vitals reviewed.    ED Treatments / Results  Labs (all labs ordered are listed, but only abnormal results are displayed) Labs Reviewed  - No data to display  EKG  EKG Interpretation None       Radiology No results found.  Procedures .Nerve Block Date/Time: 01/19/2016 7:06 PM Performed by: Mancel Bale Authorized by: Mancel Bale   Consent:    Consent given by:  Patient   Risks discussed:  Bleeding and pain   Alternatives discussed:  No treatment Indications:    Indications:  Pain relief and procedural anesthesia Location:    Nerve block body site: Left finger, 3.   Laterality:  Left Pre-procedure details:    Skin preparation:  2% chlorhexidine   Preparation: Patient was prepped and draped in usual sterile fashion   Skin anesthesia (see MAR for exact dosages):    Skin anesthesia method:  None Procedure details (see MAR for exact dosages):    Block needle gauge:  25 G   Anesthetic injected:  Bupivacaine 0.5% w/o epi   Steroid injected:  None   Additive injected:  None   Injection procedure:  Anatomic landmarks identified, incremental injection and negative aspiration for blood Post-procedure details:    Dressing:  Sterile dressing   Outcome:  Anesthesia achieved   Patient tolerance of procedure:  Tolerated well, no immediate complications  .Marland KitchenLaceration Repair Date/Time: 01/19/2016 7:50 PM Performed by: Mancel Bale Authorized by: Mancel Bale   Consent:    Consent obtained:  Verbal   Consent given by:  Patient   Risks discussed:  Infection, need for additional repair and poor wound healing   Alternatives discussed:  No treatment Anesthesia (see MAR for exact dosages):    Anesthesia method: Digital nerve block, as noted in nerve block procedure note. Laceration details:    Length (cm):  3.5 Repair type:    Repair type:  Simple Pre-procedure details:    Preparation:  Patient was prepped and draped in usual sterile fashion Exploration:    Hemostasis achieved with:  Direct pressure   Wound exploration: wound explored through full range of motion and entire depth of wound probed and  visualized     Wound extent: no foreign bodies/material noted, no muscle damage noted, no nerve damage noted, no tendon damage noted, no underlying fracture noted and no vascular damage noted     Contaminated: no   Treatment:    Area cleansed with:  Betadine   Amount of cleaning:  Standard   Irrigation solution:  Sterile saline   Irrigation method:  Syringe   Visualized foreign bodies/material removed: no   Skin repair:    Repair method:  Sutures Approximation:    Approximation:  Loose   Vermilion border: well-aligned   Post-procedure details:    Dressing:  Non-adherent dressing and sterile dressing   Patient tolerance of procedure:  Tolerated well, no immediate complications Comments:     Xeroform and Coban dressing, applied, by me   (including critical care time)  Medications Ordered in  ED Medications  bupivacaine (MARCAINE) 0.5 % injection 10 mL (10 mLs Infiltration Given by Other 01/19/16 1902)  Tdap (BOOSTRIX) injection 0.5 mL (0.5 mLs Intramuscular Given 01/19/16 2006)     Initial Impression / Assessment and Plan / ED Course  I have reviewed the triage vital signs and the nursing notes.  Pertinent labs & imaging results that were available during my care of the patient were reviewed by me and considered in my medical decision making (see chart for details).  Clinical Course     Medications  bupivacaine (MARCAINE) 0.5 % injection 10 mL (10 mLs Infiltration Given by Other 01/19/16 1902)  Tdap (BOOSTRIX) injection 0.5 mL (0.5 mLs Intramuscular Given 01/19/16 2006)    Patient Vitals for the past 24 hrs:  BP Temp Temp src Pulse Resp SpO2  01/19/16 1811 133/89 98.3 F (36.8 C) Oral 80 18 98 %    8:11 PM Reevaluation with update and discussion. After initial assessment and treatment, an updated evaluation reveals He remains comfortable. Findings discussed with the patient and all questions answered. Emerald Gehres L    Final Clinical Impressions(s) / ED Diagnoses   Final  diagnoses:  Finger laceration, initial encounter   Accidental finger laceration, without involvement of deep structures  Nursing Notes Reviewed/ Care Coordinated Applicable Imaging Reviewed Interpretation of Laboratory Data incorporated into ED treatment  The patient appears reasonably screened and/or stabilized for discharge and I doubt any other medical condition or other [facility] requiring further screening, evaluation, or treatment in the ED at this time prior to discharge.  Plan: Home Medications- continue; Home Treatments- rest, keep wound clean and dry; return here if the recommended treatment, does not improve the symptoms; Recommended follow up- PCP 10 days ans prn   New Prescriptions New Prescriptions   No medications on file     Mancel Bale, MD 01/19/16 2013

## 2016-11-12 ENCOUNTER — Emergency Department (HOSPITAL_COMMUNITY)
Admission: EM | Admit: 2016-11-12 | Discharge: 2016-11-12 | Disposition: A | Discharge status: Home / Self Care | Payer: No Typology Code available for payment source | Attending: Emergency Medicine | Admitting: Emergency Medicine

## 2016-11-12 ENCOUNTER — Encounter (HOSPITAL_COMMUNITY): Payer: Self-pay | Admitting: Emergency Medicine

## 2016-11-12 DIAGNOSIS — Y990 Civilian activity done for income or pay: Secondary | ICD-10-CM | POA: Insufficient documentation

## 2016-11-12 DIAGNOSIS — W228XXA Striking against or struck by other objects, initial encounter: Secondary | ICD-10-CM | POA: Insufficient documentation

## 2016-11-12 DIAGNOSIS — H60512 Acute actinic otitis externa, left ear: Secondary | ICD-10-CM | POA: Insufficient documentation

## 2016-11-12 DIAGNOSIS — Z79899 Other long term (current) drug therapy: Secondary | ICD-10-CM | POA: Insufficient documentation

## 2016-11-12 DIAGNOSIS — Z7982 Long term (current) use of aspirin: Secondary | ICD-10-CM | POA: Insufficient documentation

## 2016-11-12 DIAGNOSIS — Y939 Activity, unspecified: Secondary | ICD-10-CM | POA: Insufficient documentation

## 2016-11-12 DIAGNOSIS — S060X0A Concussion without loss of consciousness, initial encounter: Secondary | ICD-10-CM | POA: Insufficient documentation

## 2016-11-12 DIAGNOSIS — S0990XA Unspecified injury of head, initial encounter: Secondary | ICD-10-CM

## 2016-11-12 DIAGNOSIS — H60502 Unspecified acute noninfective otitis externa, left ear: Secondary | ICD-10-CM

## 2016-11-12 DIAGNOSIS — I10 Essential (primary) hypertension: Secondary | ICD-10-CM | POA: Insufficient documentation

## 2016-11-12 DIAGNOSIS — I252 Old myocardial infarction: Secondary | ICD-10-CM | POA: Insufficient documentation

## 2016-11-12 DIAGNOSIS — Y929 Unspecified place or not applicable: Secondary | ICD-10-CM | POA: Insufficient documentation

## 2016-11-12 MED ORDER — OFLOXACIN 0.3 % OP SOLN
5.0000 [drp] | Freq: Every day | OPHTHALMIC | Status: DC
  Administered 2016-11-12: 5 [drp] via OTIC
  Filled 2016-11-12 (×2): qty 5

## 2016-11-12 MED ORDER — OFLOXACIN 0.3 % OT SOLN
10.0000 [drp] | Freq: Every day | OTIC | 0 refills | Status: AC
Start: 2016-11-12 — End: 2016-11-19

## 2016-11-12 NOTE — ED Notes (Signed)
Patient Alert and oriented X4. Stable and ambulatory. Patient verbalized understanding of the discharge instructions.  Patient belongings were taken by the patient.  
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
## 2017-01-06 ENCOUNTER — Emergency Department (HOSPITAL_COMMUNITY): Payer: Self-pay

## 2017-01-06 ENCOUNTER — Emergency Department (HOSPITAL_COMMUNITY)
Admission: EM | Admit: 2017-01-06 | Discharge: 2017-01-07 | Disposition: A | Discharge status: Home / Self Care | Payer: Self-pay | Attending: Physician Assistant | Admitting: Physician Assistant

## 2017-01-06 ENCOUNTER — Encounter (HOSPITAL_COMMUNITY): Payer: Self-pay | Admitting: Emergency Medicine

## 2017-01-06 DIAGNOSIS — I10 Essential (primary) hypertension: Secondary | ICD-10-CM | POA: Insufficient documentation

## 2017-01-06 DIAGNOSIS — S40811A Abrasion of right upper arm, initial encounter: Secondary | ICD-10-CM | POA: Insufficient documentation

## 2017-01-06 DIAGNOSIS — Y929 Unspecified place or not applicable: Secondary | ICD-10-CM | POA: Insufficient documentation

## 2017-01-06 DIAGNOSIS — S40812A Abrasion of left upper arm, initial encounter: Secondary | ICD-10-CM | POA: Insufficient documentation

## 2017-01-06 DIAGNOSIS — S060X9A Concussion with loss of consciousness of unspecified duration, initial encounter: Secondary | ICD-10-CM | POA: Insufficient documentation

## 2017-01-06 DIAGNOSIS — Y9389 Activity, other specified: Secondary | ICD-10-CM | POA: Insufficient documentation

## 2017-01-06 DIAGNOSIS — Y999 Unspecified external cause status: Secondary | ICD-10-CM | POA: Insufficient documentation

## 2017-01-06 DIAGNOSIS — S0181XA Laceration without foreign body of other part of head, initial encounter: Secondary | ICD-10-CM | POA: Insufficient documentation

## 2017-01-06 LAB — CBC
HEMATOCRIT: 48 % (ref 39.0–52.0)
Hemoglobin: 16.3 g/dL (ref 13.0–17.0)
MCH: 30.5 pg (ref 26.0–34.0)
MCHC: 34 g/dL (ref 30.0–36.0)
MCV: 89.7 fL (ref 78.0–100.0)
Platelets: 323 10*3/uL (ref 150–400)
RBC: 5.35 MIL/uL (ref 4.22–5.81)
RDW: 12.9 % (ref 11.5–15.5)
WBC: 7.3 10*3/uL (ref 4.0–10.5)

## 2017-01-06 LAB — COMPREHENSIVE METABOLIC PANEL
ALBUMIN: 3.6 g/dL (ref 3.5–5.0)
ALT: 23 U/L (ref 17–63)
AST: 34 U/L (ref 15–41)
Alkaline Phosphatase: 63 U/L (ref 38–126)
Anion gap: 7 (ref 5–15)
BUN: 13 mg/dL (ref 6–20)
CHLORIDE: 106 mmol/L (ref 101–111)
CO2: 24 mmol/L (ref 22–32)
Calcium: 8.9 mg/dL (ref 8.9–10.3)
Creatinine, Ser: 1.33 mg/dL — ABNORMAL HIGH (ref 0.61–1.24)
GFR calc Af Amer: >60 mL/min (ref >60)
GFR calc non Af Amer: 59 mL/min — ABNORMAL LOW (ref >60)
GLUCOSE: 110 mg/dL — AB (ref 65–99)
POTASSIUM: 4.2 mmol/L (ref 3.5–5.1)
SODIUM: 137 mmol/L (ref 135–145)
Total Bilirubin: 1.1 mg/dL (ref 0.3–1.2)
Total Protein: 6.1 g/dL — ABNORMAL LOW (ref 6.5–8.1)

## 2017-01-06 LAB — I-STAT CHEM 8, ED
BUN: 16 mg/dL (ref 6–20)
Calcium, Ion: 1.07 mmol/L — ABNORMAL LOW (ref 1.15–1.40)
Chloride: 105 mmol/L (ref 101–111)
Creatinine, Ser: 1.3 mg/dL — ABNORMAL HIGH (ref 0.61–1.24)
Glucose, Bld: 109 mg/dL — ABNORMAL HIGH (ref 65–99)
HEMATOCRIT: 48 % (ref 39.0–52.0)
HEMOGLOBIN: 16.3 g/dL (ref 13.0–17.0)
Potassium: 4.3 mmol/L (ref 3.5–5.1)
SODIUM: 139 mmol/L (ref 135–145)
TCO2: 25 mmol/L (ref 0–100)

## 2017-01-06 LAB — I-STAT CG4 LACTIC ACID, ED: Lactic Acid, Venous: 1.16 mmol/L (ref 0.5–1.9)

## 2017-01-06 LAB — SAMPLE TO BLOOD BANK

## 2017-01-06 LAB — ETHANOL: Alcohol, Ethyl (B): <5 mg/dL (ref <5)

## 2017-01-06 LAB — CDS SEROLOGY

## 2017-01-06 LAB — PROTIME-INR
INR: 0.86
Prothrombin Time: 11.7 seconds (ref 11.4–15.2)

## 2017-01-06 MED ORDER — TETANUS-DIPHTH-ACELL PERTUSSIS 5-2.5-18.5 LF-MCG/0.5 IM SUSP
0.5000 mL | Freq: Once | INTRAMUSCULAR | Status: AC
  Administered 2017-01-06: 0.5 mL via INTRAMUSCULAR
  Filled 2017-01-06: qty 0.5

## 2017-01-06 MED ORDER — FENTANYL CITRATE (PF) 100 MCG/2ML IJ SOLN
50.0000 ug | Freq: Once | INTRAMUSCULAR | Status: AC
  Administered 2017-01-06: 50 ug via INTRAVENOUS
  Filled 2017-01-06: qty 2

## 2017-01-06 MED ORDER — ONDANSETRON HCL 4 MG/2ML IJ SOLN
4.0000 mg | Freq: Once | INTRAMUSCULAR | Status: AC
  Administered 2017-01-06: 4 mg via INTRAVENOUS
  Filled 2017-01-06: qty 2

## 2017-01-06 MED ORDER — LIDOCAINE HCL (PF) 1 % IJ SOLN
5.0000 mL | Freq: Once | INTRAMUSCULAR | Status: AC
  Administered 2017-01-06: 5 mL via INTRADERMAL
  Filled 2017-01-06: qty 5
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
## 2017-01-07 ENCOUNTER — Encounter (HOSPITAL_COMMUNITY): Payer: Self-pay | Admitting: Emergency Medicine

## 2017-01-07 MED ORDER — OXYCODONE-ACETAMINOPHEN 5-325 MG PO TABS: 1.0000 | ORAL_TABLET | Freq: Four times a day (QID) | ORAL | 0 refills | Status: DC | PRN

## 2017-01-07 MED ORDER — IBUPROFEN 800 MG PO TABS: 800.0000 mg | ORAL_TABLET | Freq: Three times a day (TID) | ORAL | 0 refills | Status: DC

## 2017-01-07 MED ORDER — CYCLOBENZAPRINE HCL 10 MG PO TABS: 10.0000 mg | ORAL_TABLET | Freq: Two times a day (BID) | ORAL | 0 refills | Status: DC | PRN
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
## 2017-01-07 NOTE — ED Notes (Signed)
Pt is unstable and very dizzy upon attemting to ambulate EDP and charge nurse aware pt will rest in the hallway

## 2017-01-20 ENCOUNTER — Emergency Department (HOSPITAL_COMMUNITY): Payer: Self-pay

## 2017-01-20 ENCOUNTER — Encounter (HOSPITAL_COMMUNITY): Payer: Self-pay | Admitting: *Deleted

## 2017-01-20 ENCOUNTER — Emergency Department (HOSPITAL_COMMUNITY)
Admission: EM | Admit: 2017-01-20 | Discharge: 2017-01-20 | Disposition: A | Discharge status: Home / Self Care | Payer: Self-pay | Attending: Emergency Medicine | Admitting: Emergency Medicine

## 2017-01-20 DIAGNOSIS — S62522A Displaced fracture of distal phalanx of left thumb, initial encounter for closed fracture: Secondary | ICD-10-CM | POA: Insufficient documentation

## 2017-01-20 DIAGNOSIS — Y929 Unspecified place or not applicable: Secondary | ICD-10-CM | POA: Insufficient documentation

## 2017-01-20 DIAGNOSIS — X503XXA Overexertion from repetitive movements, initial encounter: Secondary | ICD-10-CM | POA: Insufficient documentation

## 2017-01-20 DIAGNOSIS — E785 Hyperlipidemia, unspecified: Secondary | ICD-10-CM | POA: Insufficient documentation

## 2017-01-20 DIAGNOSIS — Y9389 Activity, other specified: Secondary | ICD-10-CM | POA: Insufficient documentation

## 2017-01-20 DIAGNOSIS — Z791 Long term (current) use of non-steroidal anti-inflammatories (NSAID): Secondary | ICD-10-CM | POA: Insufficient documentation

## 2017-01-20 DIAGNOSIS — I10 Essential (primary) hypertension: Secondary | ICD-10-CM | POA: Insufficient documentation

## 2017-01-20 DIAGNOSIS — Y999 Unspecified external cause status: Secondary | ICD-10-CM | POA: Insufficient documentation

## 2017-01-20 DIAGNOSIS — Z79899 Other long term (current) drug therapy: Secondary | ICD-10-CM | POA: Insufficient documentation

## 2017-01-20 NOTE — Progress Notes (Signed)
Orthopedic Tech Progress Note Patient Details:  Bryce Ortega February 21, 1961 [id number]  Ortho Devices Type of Ortho Device: Finger splint Ortho Device/Splint Location: applied finger splint to pt left thumb and wrapped ace bandage around left hand for support.  Left hand thumb.  pt tolerated application very well.  Ortho Device/Splint Interventions: Application, Adjustment   Alvina Chou 01/20/2017, 12:49 PM
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
## 2017-01-20 NOTE — ED Notes (Signed)
Ortho tech in. 

## 2017-01-20 NOTE — ED Notes (Signed)
CALLED IN LOBBY, NO ANSWER.

## 2017-01-20 NOTE — Discharge Instructions (Signed)
Recommend tylenol or motrin for pain.  Leave splint in place for now. Follow-up with hand specialist, Dr. Izora Ribas-- call his office to make appt. Return to the ED for new or worsening symptoms.

## 2017-01-20 NOTE — ED Notes (Signed)
Ortho tech called 
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
## 2017-02-22 ENCOUNTER — Emergency Department (HOSPITAL_COMMUNITY): Payer: Self-pay

## 2017-02-22 ENCOUNTER — Encounter (HOSPITAL_COMMUNITY): Payer: Self-pay | Admitting: Emergency Medicine

## 2017-02-22 ENCOUNTER — Observation Stay (HOSPITAL_COMMUNITY)
Admission: EM | Admit: 2017-02-22 | Discharge: 2017-02-23 | Disposition: A | Discharge status: Home / Self Care | Payer: Self-pay | Attending: Internal Medicine | Admitting: Internal Medicine

## 2017-02-22 DIAGNOSIS — Z8249 Family history of ischemic heart disease and other diseases of the circulatory system: Secondary | ICD-10-CM | POA: Insufficient documentation

## 2017-02-22 DIAGNOSIS — T405X5A Adverse effect of cocaine, initial encounter: Secondary | ICD-10-CM

## 2017-02-22 DIAGNOSIS — I10 Essential (primary) hypertension: Secondary | ICD-10-CM | POA: Diagnosis present

## 2017-02-22 DIAGNOSIS — Z79899 Other long term (current) drug therapy: Secondary | ICD-10-CM

## 2017-02-22 DIAGNOSIS — R0602 Shortness of breath: Secondary | ICD-10-CM | POA: Insufficient documentation

## 2017-02-22 DIAGNOSIS — Z9861 Coronary angioplasty status: Secondary | ICD-10-CM

## 2017-02-22 DIAGNOSIS — Z7982 Long term (current) use of aspirin: Secondary | ICD-10-CM | POA: Insufficient documentation

## 2017-02-22 DIAGNOSIS — Z8674 Personal history of sudden cardiac arrest: Secondary | ICD-10-CM | POA: Insufficient documentation

## 2017-02-22 DIAGNOSIS — R072 Precordial pain: Principal | ICD-10-CM | POA: Diagnosis present

## 2017-02-22 DIAGNOSIS — E785 Hyperlipidemia, unspecified: Secondary | ICD-10-CM | POA: Diagnosis present

## 2017-02-22 DIAGNOSIS — E7800 Pure hypercholesterolemia, unspecified: Secondary | ICD-10-CM | POA: Insufficient documentation

## 2017-02-22 DIAGNOSIS — R079 Chest pain, unspecified: Secondary | ICD-10-CM

## 2017-02-22 DIAGNOSIS — Z885 Allergy status to narcotic agent status: Secondary | ICD-10-CM

## 2017-02-22 DIAGNOSIS — I2510 Atherosclerotic heart disease of native coronary artery without angina pectoris: Secondary | ICD-10-CM

## 2017-02-22 DIAGNOSIS — F1410 Cocaine abuse, uncomplicated: Secondary | ICD-10-CM | POA: Diagnosis present

## 2017-02-22 HISTORY — DX: Cocaine abuse, uncomplicated: F14.10

## 2017-02-22 LAB — TROPONIN I
Troponin I: <0.03 ng/mL (ref <0.03)
Troponin I: 0.06 ng/mL (ref <0.03)
Troponin I: 0.03 ng/mL (ref <0.03)

## 2017-02-22 LAB — BASIC METABOLIC PANEL
ANION GAP: 5 (ref 5–15)
BUN: 13 mg/dL (ref 6–20)
CALCIUM: 8.2 mg/dL — AB (ref 8.9–10.3)
CO2: 25 mmol/L (ref 22–32)
CREATININE: 1.25 mg/dL — AB (ref 0.61–1.24)
Chloride: 108 mmol/L (ref 101–111)
GFR calc Af Amer: >60 mL/min (ref >60)
GLUCOSE: 99 mg/dL (ref 65–99)
POTASSIUM: 4.1 mmol/L (ref 3.5–5.1)
SODIUM: 138 mmol/L (ref 135–145)

## 2017-02-22 LAB — RAPID URINE DRUG SCREEN, HOSP PERFORMED
AMPHETAMINES: NOT DETECTED
BENZODIAZEPINES: NOT DETECTED
Barbiturates: NOT DETECTED
Cocaine: POSITIVE — AB
Opiates: NOT DETECTED
TETRAHYDROCANNABINOL: NOT DETECTED

## 2017-02-22 LAB — CBC
HCT: 45.3 % (ref 39.0–52.0)
Hemoglobin: 14.8 g/dL (ref 13.0–17.0)
MCH: 30.5 pg (ref 26.0–34.0)
MCHC: 32.7 g/dL (ref 30.0–36.0)
MCV: 93.2 fL (ref 78.0–100.0)
Platelets: 270 10*3/uL (ref 150–400)
RBC: 4.86 MIL/uL (ref 4.22–5.81)
RDW: 13.4 % (ref 11.5–15.5)
WBC: 7.6 10*3/uL (ref 4.0–10.5)

## 2017-02-22 LAB — I-STAT TROPONIN, ED: TROPONIN I, POC: 0 ng/mL (ref 0.00–0.08)

## 2017-02-22 LAB — MRSA PCR SCREENING: MRSA BY PCR: NEGATIVE

## 2017-02-22 MED ORDER — LOSARTAN POTASSIUM 50 MG PO TABS
50.0000 mg | ORAL_TABLET | Freq: Every day | ORAL | Status: DC
  Administered 2017-02-23: 50 mg via ORAL
  Filled 2017-02-22: qty 1

## 2017-02-22 MED ORDER — AMLODIPINE BESYLATE 2.5 MG PO TABS
2.5000 mg | ORAL_TABLET | Freq: Every day | ORAL | Status: DC
  Administered 2017-02-23: 2.5 mg via ORAL
  Filled 2017-02-22 (×2): qty 1

## 2017-02-22 MED ORDER — GI COCKTAIL ~~LOC~~: 30.0000 mL | Freq: Four times a day (QID) | ORAL | Status: DC | PRN

## 2017-02-22 MED ORDER — ONDANSETRON HCL 4 MG/2ML IJ SOLN: 4.0000 mg | Freq: Four times a day (QID) | INTRAMUSCULAR | Status: DC | PRN

## 2017-02-22 MED ORDER — LOSARTAN POTASSIUM 25 MG PO TABS: 25.0000 mg | ORAL_TABLET | Freq: Every day | ORAL | Status: DC

## 2017-02-22 MED ORDER — ASPIRIN EC 81 MG PO TBEC
81.0000 mg | DELAYED_RELEASE_TABLET | Freq: Every day | ORAL | Status: DC
  Administered 2017-02-23: 81 mg via ORAL
  Filled 2017-02-22 (×2): qty 1

## 2017-02-22 MED ORDER — ENOXAPARIN SODIUM 40 MG/0.4ML ~~LOC~~ SOLN
40.0000 mg | Freq: Every day | SUBCUTANEOUS | Status: DC
  Administered 2017-02-22 – 2017-02-23 (×2): 40 mg via SUBCUTANEOUS
  Filled 2017-02-22 (×2): qty 0.4

## 2017-02-22 MED ORDER — SIMVASTATIN 20 MG PO TABS
20.0000 mg | ORAL_TABLET | Freq: Every day | ORAL | Status: DC
  Filled 2017-02-22: qty 1

## 2017-02-22 NOTE — H&P (Signed)
Date: 02/22/2017               Patient Name:  Bryce Ortega MRN: [id number]  DOB: [birth year] Age / Sex: 56 y.o., male   PCP: Patient, No Pcp Per         Medical Service: Internal Medicine Teaching Service         Attending Physician: Dr. Gust Rung, DO    First Contact: Dr. Crista Elliot Pager: [phone]  Second Contact: Dr. 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 He reports that this morning around 5:30-6:00 AM he was lying in bed when he developed acute onset chest pain. He describes the chest pain as a substernal sharp, stabbing in nature with radiation down his left arm. Reports associated shortness of breath and diaphoresis. Denies any nausea/vomiting, abdominal pain, lightheadedness, or vision changes. He does admit to cocaine use in the past 1-2 days. Reports that the pain is similar to his past episodes in 2016. States that the pain was constant until EMS arrived. Chest pain resolved after receiving nitro and ASA. Currently chest pain free.   In the ED, vital signs were stable with initial I-stat troponin 0.00. EKG shows normal sinus rhythm without any ischemic chagnes; unchanged from previous. Given his history of V.Fib arrest, EDP asked to admit for observation with serial troponins.   Meds:  No outpatient prescriptions have been marked as taking for the 02/22/17 encounter [facility] Encounter).   Allergies: Allergies as of 02/22/2017 - Review Complete 02/22/2017  Allergen Reaction Noted  . Propoxyphene n-acetaminophen Nausea Only 01/10/2009   Past Medical History:    Diagnosis Date  . Cocaine abuse   . High cholesterol   . Hypertension    Past Surgical History:  Procedure Laterality Date  . CARDIAC CATHETERIZATION N/A 05/10/2015   Procedure: Left Heart Cath and Coronary Angiography;  Surgeon: Dolores Patty, MD;  Location: [facility] INVASIVE CV LAB;  Service: Cardiovascular;  Laterality: N/A;  . TONSILLECTOMY     Family History:  Family History  Problem Relation Age of Onset  . Hypertension Father   . Heart disease Father   . Heart disease Mother   . Heart disease Maternal Grandfather    Social History:  Social History   Social History  . Marital status: Single    Spouse name: N/A  . Number of children: N/A  . Years of education: N/A   Social History Main Topics  . Smoking status: Never Smoker  . Smokeless tobacco: Never Used  . Alcohol use Yes     Comment: 1-2 40oz beers 3-4 times a week  . Drug use: Yes    Types: Cocaine     Comment: last use stated 2 days ago  . Sexual activity: Not Asked   Other Topics Concern  . None   Social History Narrative   ** Merged History Encounter **       Review of Systems: A complete ROS was negative except as per HPI.  Physical Exam: Blood pressure (!) 141/91, pulse 67, temperature 98.2 F (36.8 C), temperature source Oral, resp. rate 20,  height 5\' 10"  (1.778 m), weight 155 lb (70.3 kg), SpO2 97 %. General: alert, well-developed, and cooperative to examination.  Head: normocephalic and atraumatic.  Eyes: vision grossly intact, pupils equal, pupils round, pupils reactive to light, no injection and anicteric.  Mouth: pharynx pink and moist, no erythema, and no exudates.  Neck: supple, full ROM, no thyromegaly, no JVD, and no carotid bruits.  Lungs: normal respiratory effort, no accessory muscle use, normal breath sounds, no crackles, and no wheezes. Heart: normal rate, regular rhythm, no murmur, no gallop, and no rub.  Abdomen: soft, non-tender, normal bowel sounds, no distention Msk: no  joint swelling, no joint warmth, and no redness over joints.  Pulses: 2+ DP/PT pulses bilaterally Extremities: No cyanosis, clubbing, edema Neurologic: alert & oriented X3, no focal deficits Skin: turgor normal and no rashes.  Psych: Normal mood and affect  EKG: personally reviewed my interpretation is normal sinus rhythm, unchanged from previous  CXR: personally reviewed my interpretation is no acute cardiopulmonary process  Assessment & Plan by Problem:  Chest pain likely secondary to cocaine induced vasospasm:  Patient presenting with acute onset chest pain this morning after using cocaine over the weekend. Chest pain has resolved with ASA and nitro. He has a history of VFib arrest in 2016 thought to be secondary to cocaine-induced vasospasm. EKG in the ED is unremarkable without ischemic changes. Initial I-stat troponin is 0.00. Currently chest pain free. This is most likely further cocaine induced vasospasm causing his recurrent chest pain. Will admit to telemetry, cycle troponins, and repeat EKG in am. Avoid BB with ongoing cocaine abuse. Stressed the importance of stopping cocaine.   Polysubstance Abuse: History of cocaine abuse. Has been positive for bezos as well on prior UDS. Checking UDS today, pending. Reports drinking 1-2 40 oz beers 3-4 times a week. Denies any ETOH withdrawal in the past.   Essential Hypertension: On amlodipine 2.5 mg daily and Losartan 25 mg daily at home. Reports compliance. BP controlled here. Will resume home medications.   Mild Non-Obstructive Coronary Artery Disease: Noted on cath in 2016; mid RCA lesion, 40% stenosed & Dist LAD lesion, 30% stenosed. Normal LV function noted at that time. On ASA 81 mg daily and Simvastatin 20 mg daily. Continue home medications. Repeat Lipid panel in am.   Likely CKD Stage II Cr on admission is 1.25. Appears stable from baseline ~1.2-1.3 since 2016. Likely secondary to his cocaine abuse.   Dispo: Admit patient to  Observation with expected length of stay less than 2 midnights.  Signed: Valentino Nose, MD 02/22/2017, 9:41 AM  Pager: [phone]

## 2017-02-22 NOTE — ED Provider Notes (Signed)
[facility]-EMERGENCY DEPT Provider Note   CSN: [id number] Arrival date & time: 02/22/17  4098     History   Chief Complaint Chief Complaint  Patient presents with  . Chest Pain    HPI Bryce Ortega is a 56 y.o. male.  HPI   Pt is a 56 yo Presenting with chest pain. Patient did cocaine this weekend. 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 Seen by Dr. Jones Skene nsimon. Patient not had any follow-up. Continues to use cocaine. Patient reports chest pain started this morning at around 6:30 AM when he got up for work. He reports mild diaphoresis. He received 2 nitroglycerin on the way in which helped his chest pain. He is now chest pain-free.  Past Medical History:  Diagnosis Date  . High cholesterol   . Hypertension     Patient Active Problem List   Diagnosis Date Noted  . NSTEMI (non-ST elevated myocardial infarction) (HCC) 05/15/2015  . Cocaine abuse 05/15/2015  . Chest pain at rest   . Cardiac arrest (HCC) 05/10/2015  . Hyperlipidemia 05/10/2015  . Essential hypertension 01/10/2009  . SHOULDER PAIN, RIGHT 01/10/2009  . CERVICALGIA 01/10/2009  . LOW BACK PAIN 01/10/2009    Past Surgical History:  Procedure Laterality Date  . CARDIAC CATHETERIZATION N/A 05/10/2015   Procedure: Left Heart Cath and Coronary Angiography;  Surgeon: Dolores Patty, MD;  Location: [facility] INVASIVE CV LAB;  Service: Cardiovascular;  Laterality: N/A;  . TONSILLECTOMY         Home Medications    Prior to Admission medications   Medication Sig Start Date End Date Taking? Authorizing Provider  amLODipine (NORVASC) 2.5 MG tablet Take 1 tablet (2.5 mg total) by mouth daily. 05/16/15   Dhungel, Theda Belfast, MD  aspirin EC 81 MG tablet Take 1 tablet (81 mg total) by mouth daily. 05/11/15   Ghimire, Werner Lean, MD  cyclobenzaprine (FLEXERIL) 10 MG tablet Take 1 tablet (10 mg total) by mouth 2 (two) times daily as needed for muscle spasms. 01/07/17   Chirsty Armistead Lyn, MD    ibuprofen (ADVIL,MOTRIN) 800 MG tablet Take 1 tablet (800 mg total) by mouth 3 (three) times daily. 01/07/17   Malakhi Markwood Lyn, MD  losartan (COZAAR) 25 MG tablet Take 1 tablet (25 mg total) by mouth daily. 05/16/15   Dhungel, Theda Belfast, MD  nitroGLYCERIN (NITROSTAT) 0.4 MG SL tablet Place 1 tablet (0.4 mg total) under the tongue every 5 (five) minutes as needed for chest pain (for 3 doses). Patient not taking: Reported on 05/15/2015 05/11/15   Maretta Bees, MD  oxyCODONE-acetaminophen (PERCOCET/ROXICET) 5-325 MG tablet Take 1 tablet by mouth every 6 (six) hours as needed for severe pain. 01/07/17   Stephine Langbehn Lyn, MD  simvastatin (ZOCOR) 20 MG tablet Take 1 tablet (20 mg total) by mouth daily at 6 PM. 05/11/15   Ghimire, Werner Lean, MD    Family History Family History  Problem Relation Age of Onset  . Hypertension Father   . Heart disease Father   . Heart disease Mother   . Heart disease Maternal Grandfather     Social History Social History  Substance Use Topics  . Smoking status: Never Smoker  . Smokeless tobacco: Never Used  . Alcohol use Yes     Comment: social     Allergies   Propoxyphene n-acetaminophen   Review of Systems Review of Systems  Constitutional: Negative for activity change and fever.  HENT: Negative for congestion.  Respiratory: Positive for chest tightness and shortness of breath.   Cardiovascular: Positive for chest pain.  Gastrointestinal: Negative for abdominal pain.     Physical Exam Updated Vital Signs BP (!) 141/91 (BP Location: Right Arm)   Pulse 67   Temp 98.2 F (36.8 C) (Oral)   Resp 20   Ht 5\' 10"  (1.778 m)   Wt 70.3 kg (155 lb)   SpO2 97%   BMI 22.24 kg/m   Physical Exam  Constitutional: He is oriented to person, place, and time. He appears well-nourished.  HENT:  Head: Normocephalic.  Eyes: Conjunctivae are normal.  Cardiovascular: Normal rate.   Pulmonary/Chest: Effort normal and breath sounds normal. No  respiratory distress.  Abdominal: Soft. He exhibits no distension. There is no tenderness.  Neurological: He is oriented to person, place, and time.  Skin: Skin is warm and dry. He is not diaphoretic.  Psychiatric: He has a normal mood and affect. His behavior is normal.     ED Treatments / Results  Labs (all labs ordered are listed, but only abnormal results are displayed) Labs Reviewed  BASIC METABOLIC PANEL - Abnormal; Notable for the following:       Result Value   Creatinine, Ser 1.25 (*)    Calcium 8.2 (*)    All other components within normal limits  CBC  I-STAT TROPONIN, ED    EKG  EKG Interpretation  Date/Time:  Monday February 22 2017 07:46:32 EDT Ventricular Rate:  66 PR Interval:    QRS Duration: 88 QT Interval:  413 QTC Calculation: 433 R Axis:   53 Text Interpretation:  Sinus rhythm Low voltage, precordial leads No significant change since last tracing Confirmed by Bary Castilla ([id number]) on 02/22/2017 8:21:53 AM       Radiology Dg Chest 2 View  Result Date: 02/22/2017 CLINICAL DATA:  Chest pain.  Hypertension EXAM: CHEST  2 VIEW COMPARISON:  Oct 14, 2015 FINDINGS: There is no edema or consolidation. Heart size and pulmonary vascularity are normal. No adenopathy. No pneumothorax. No bone lesions. IMPRESSION: No edema or consolidation. Electronically Signed   By: Bretta Bang III M.D.   On: 02/22/2017 08:17    Procedures Procedures (including critical care time)  Medications Ordered in ED Medications - No data to display   Initial Impression / Assessment and Plan / ED Course  I have reviewed the triage vital signs and the nursing notes.  Pertinent labs & imaging results that were available during my care of the patient were reviewed by me and considered in my medical decision making (see chart for details).      Pt is a 56 yo Presenting with chest pain. Patient did cocaine this weekend. Patient of note has had a V. fib arrest from cocaine  vasospasm in 2016. Seen by Dr. Jones Skene nsimon. Patient not had any follow-up. Continues to use cocaine. Patient reports chest pain started this morning at around 6:30 AM when he got up for work. He reports mild diaphoresis. He received 2 nitroglycerin on the way in which helped his chest pain. He is now chest pain-free.  8:39 AM Given V. fib arrest with recent cocaine vasospasm, will admit patient for serial troponins.    Final Clinical Impressions(s) / ED Diagnoses   Final diagnoses:  None    New Prescriptions New Prescriptions   No medications on file     Abelino Derrick, MD 02/25/17 [phone]

## 2017-02-22 NOTE — ED Triage Notes (Signed)
Pt arrives from home via [facility] c/o sudden onset L sided CP radiating to LUE, describes as "sharp." Pt states feels liek his last MI (2016). Pt reports last cocaine use 2 days ago.  EMS reports giving 324 ASA, 2 NTG with relief from pain 7/10 to 0810.  Pt denies CP at this time.  Resp e/u.

## 2017-02-23 DIAGNOSIS — T405X5A Adverse effect of cocaine, initial encounter: Secondary | ICD-10-CM

## 2017-02-23 LAB — LIPID PANEL
CHOL/HDL RATIO: 2.3 ratio
Cholesterol: 152 mg/dL (ref 0–200)
HDL: 65 mg/dL (ref >40)
LDL CALC: 74 mg/dL (ref 0–99)
TRIGLYCERIDES: 67 mg/dL (ref <150)
VLDL: 13 mg/dL (ref 0–40)

## 2017-02-23 LAB — HIV ANTIBODY (ROUTINE TESTING W REFLEX): HIV Screen 4th Generation wRfx: NONREACTIVE

## 2017-02-23 NOTE — Progress Notes (Signed)
Pt has orders to be discharged. Discharge instructions given and pt has no additional questions at this time. Medication regimen reviewed and pt educated. Pt verbalized understanding and has no additional questions. Telemetry box removed. IV removed and site in good condition. Pt stable and waiting for transportation.  Atilla Zollner RN 

## 2017-02-23 NOTE — Clinical Social Work Note (Signed)
Bus pass given to RN.  CSW signing off.  Malaiyah Achorn, CSW [phone]  

## 2017-02-23 NOTE — Discharge Instructions (Signed)
Please call the number provided for the [facility] clinic to establish care. If they are unable to see you, you may contact any primary care based office to establish. Please inform them that you were recently hospitalized and need to establish care.  It is our recommendation that you discontinue ingesting cocaine from this point forward due to the high degree of detrimental side effects.   If your symptoms reoccur, or you develop an uncontrollable headache, shortness of breath, chest pain, rapid heart rate, tearing sensation in your chest, or other concerning symptoms, please return to the emergency department.  Thank you for your visit to [facility].

## 2017-02-23 NOTE — Progress Notes (Signed)
   Subjective: the patient was sitting up in his urine today eating breakfast watching TV. He stated that he had nothad a recurrence of est pain, palpitations, or shortness of breath. Patient denied concerns today with agreement. Discharged home. Patient was again advised that the cocaine was harming him and that he should stop ingesting cocaine. Patient nodded in agreement to this statement. Patient also advised to follow-up with his PCP as an outpatient, to which he reiterated that he did have PCP, only a counseler, patient was advised to establish care for treatment of his chronic medical conditions including hypertension. Patient provided with information to assist him with his care.  Objective:  Vital signs in last 24 hours: Vitals:   02/23/17 0113 02/23/17 0619 02/23/17 0742 02/23/17 0952  BP: (!) 156/96 (!) 161/92 136/83 132/83  Pulse: 65 69 66   Resp: 18 18 16    Temp: 97.6 F (36.4 C) 97.7 F (36.5 C) 98 F (36.7 C)   TempSrc: Oral Oral Oral   SpO2: 98% 98% 97%   Weight:  151 lb 6.4 oz (68.7 kg)    Height:       ROS negative except as per HPI.  Physical Exam  Constitutional: He appears well-developed and well-nourished. No distress.  HENT:  Head: Normocephalic and atraumatic.  Eyes: EOM are normal.  Neck: Normal range of motion.  Cardiovascular: Normal rate and regular rhythm.   No murmur heard. Pulmonary/Chest: Effort normal and breath sounds normal. No respiratory distress.  Abdominal: Soft. Bowel sounds are normal. He exhibits no distension.  Musculoskeletal: He exhibits no edema.  Neurological: He is alert.  Skin: Skin is warm. Capillary refill takes less than 2 seconds. He is not diaphoretic.  Psychiatric: He has a normal mood and affect.    Assessment/Plan:  Principal Problem:   Chest pain Active Problems:   Essential hypertension   Hyperlipidemia   Cocaine abuse   Mild CAD  Chest pain likely secondary to cocaine induced vasospasm:  -Third troponin  negative overnight -Patient remained pain free -plan to discharge home with advise to discontinue use of cocaine and to establish care with a primary care doctor  Polysubstance Abuse: History of cocaine abuse. Has been positive for bezos as well on prior UDS.  -UDS negative except for cocaine -Reports drinking 1-2 40 oz beers 3-4 times a week. Denies any ETOH withdrawal in the past.   Essential Hypertension: -amlodipine 2.5 mg daily  -Losartan 25 mg daily at home -Reports compliance.  -Will resume home medications at discharge as well  Mild Non-Obstructive Coronary Artery Disease: Noted on cath in 2016; mid RCA lesion, 40% stenosed & Dist LAD lesion, 30% stenosed. Normal LV function noted at that time.  -ASA 81 mg daily  -Simvastatin 20 mg daily  -Repeat Lipid panel improved and unremarkable   Likely CKD Stage II Cr on admission is 1.25. Appears stable from baseline ~1.2-1.3 since 2016. Likely secondary to his cocaine abuse.  -recommended follow-up as an outpatient  Dispo: Anticipated discharge in approximately  today(s).   Lanelle Bal, MD 02/23/2017, 10:00 AM Pager: Pager# [phone]

## 2017-02-23 NOTE — Discharge Summary (Signed)
Name: Bryce Ortega MRN: [id number] DOB: 1961/01/30 56 y.o. PCP: Patient, No Pcp Per  Date of Admission: 02/22/2017  7:43 AM Date of Discharge: 02/23/2017 Attending Physician: Gust Rung, DO  Discharge Diagnosis: Principal Problem:   Chest pain Active Problems:   Essential hypertension   Hyperlipidemia   Cocaine abuse   Mild CAD   Cocaine adverse reaction  Discharge Medications: Allergies as of 02/23/2017      Reactions   Propoxyphene N-acetaminophen Nausea Only   REACTION: GI upset      Medication List    STOP taking these medications   ibuprofen 800 MG tablet Commonly known as:  ADVIL,MOTRIN   oxyCODONE-acetaminophen 5-325 MG tablet Commonly known as:  PERCOCET/ROXICET     TAKE these medications   amLODipine 2.5 MG tablet Commonly known as:  NORVASC Take 1 tablet (2.5 mg total) by mouth daily.   aspirin EC 81 MG tablet Take 1 tablet (81 mg total) by mouth daily.   cyclobenzaprine 10 MG tablet Commonly known as:  FLEXERIL Take 1 tablet (10 mg total) by mouth 2 (two) times daily as needed for muscle spasms.   losartan 25 MG tablet Commonly known as:  COZAAR Take 1 tablet (25 mg total) by mouth daily. What changed:  Another medication with the same name was removed. Continue taking this medication, and follow the directions you see here.   nitroGLYCERIN 0.4 MG SL tablet Commonly known as:  NITROSTAT Place 1 tablet (0.4 mg total) under the tongue every 5 (five) minutes as needed for chest pain (for 3 doses).   simvastatin 20 MG tablet Commonly known as:  ZOCOR Take 1 tablet (20 mg total) by mouth daily at 6 PM. What changed:  when to take this            Discharge Care Instructions        Start     Ordered   02/23/17 0000  Increase activity slowly     02/23/17 0944   02/23/17 0000  Diet - low sodium heart healthy     02/23/17 0944      Disposition and follow-up:   Bryce Ortega was discharged from [facility]  in Stable condition.  At the hospital follow up visit please address:  1.  Cocaine abuse and cessation, hypertension, hyperlipidemia, coronary artery disease  2.  Labs / imaging needed at time of follow-up: n/a  3.  Pending labs/ test needing follow-up: n/a  Follow-up Appointments: Follow-up Information    [facility] In 1 week.   Why:  Patient needs to call 02/24/17 to schedule follow-up appt. Contact information: [address] [place] [address] [phone]          Hospital Course by problem list: Principal Problem:   Chest pain Active Problems:   Essential hypertension   Hyperlipidemia   Cocaine abuse   Mild CAD   Cocaine adverse reaction   1. Chest pain secondary to cocaine adverse reaction Patient presented with acute sudden onset, substernal, sharp chest pain while lying in bed which radiated down his left arm and was associated with SOB and diaphoresis. He had three negative troponins and and EKG insignificant for ischemic changes. Given his presentation, negative evaluation and history of cocaine abuse prior to the chest pain, it is most likely secondary to cocaine-induced coronary vasospasm. Patient remained stable throughout his stay denying return of his symptoms. By the morning of day two he had  finished breakfast, was sitting comfortably in his chair and denied concerns. As such he was deemed stable to discharge to self care and discharged home in stable condition.   2. Hypertension Patient was hypertensive on admission. He was placed on his home dose of amlodipine 2.5 mg, aspirin 81 mg, losartan 50 mg, and simvastatin 20 mg. His pressures improved 132/83 at discharge. Patient was discharged his home antihypertensive medication regimen with advice to follow-up with a PCP for further care.  3. Hyperlipidemia Patient's lipid panel was well within the normal limits. He was placed on his home dose of simvastatin   and advised to continue this at home.  4. Mild CAD Patient taking ASAS  daily, simvastatin  daily. EKG and troponins were insignificant for ACS. Patient advised to follow-up with physician regarding long term care.   General discharge recommendations: The patient was advised to please return to the emergency department if he develops sudden onset chest pain, numbness or tingling into his arm, shortness of breath, extreme headache, palpitations, or other concerning symptoms. Patient was provided with the number to call [facility] for follow-up. If they were unable to schedule him for an appointment within a week or sooner, he was advised to seek an alternative facility that would provide outpatient care. He agreed to this plan.    Discharge Vitals:   BP 132/83 (BP Location: Right Arm)   Pulse 66   Temp 98 F (36.7 C) (Oral)   Resp 16   Ht  (1.778 m)   Wt 151 lb 6.4 oz (68.7 kg)   SpO2 97%   BMI 21.72 kg/m   Pertinent Labs, Studies, and Procedures:  CMP Latest Ref Rng & Units 02/22/2017 01/06/2017 01/06/2017  Glucose 65 - 99 mg/dL 99 409(W) 119(J)  BUN 6 - 20 mg/dL Creatinine 0.61 - 1.24 mg/dL 4.78(G) 9.56(O) 1.30(Q)  Sodium 135 - 145 mmol/L 138 139 137  Potassium 3.5 - 5.1 mmol/L 4.1 4.3 4.2  Chloride 101 - 111 mmol/L 108 105 106  CO2 22 - 32 mmol/L 25 - 24  Calcium 8.9 - 10.3 mg/dL 8.2(L) - 8.9  Total Protein 6.5 - 8.1 g/dL - - 6.1(L)  Total Bilirubin 0.3 - 1.2 mg/dL - - 1.1  Alkaline Phos 38 - 126 U/L - - 63  AST 15 - 41 U/L - - 34  ALT 17 - 63 U/L - - 23   CBC    Component Value Date/Time   WBC 7.6 02/22/2017 0755   RBC 4.86 02/22/2017 0755   HGB 14.8 02/22/2017 0755   HCT 45.3 02/22/2017 0755   PLT 270 02/22/2017 0755   MCV 93.2 02/22/2017 0755   MCH 30.5 02/22/2017 0755   MCHC 32.7 02/22/2017 0755   RDW 13.4 02/22/2017 0755   LYMPHSABS 1.6 05/15/2015 1126   MONOABS 0.9 05/15/2015 1126   EOSABS 0.4 05/15/2015 1126    BASOSABS 0.1 05/15/2015 1126    Ref Range & Units 05:02  Cholesterol 0 - 200 mg/dL 657   Triglycerides <846 mg/dL 67   HDL >96 mg/dL 65   Total CHOL/HDL Ratio RATIO 2.3   VLDL 0 - 40 mg/dL 13   LDL Cholesterol 0 - 99 mg/dL 74      Discharge Instructions: Discharge Instructions    Diet - low sodium heart healthy    Complete by:  As directed    Increase activity slowly    Complete by:  As directed  Signed: Lanelle Bal, MD 02/23/2017, 11:38 AM   Pager: Pager# [phone]

## 2017-04-05 ENCOUNTER — Emergency Department (HOSPITAL_COMMUNITY): Payer: Self-pay

## 2017-04-05 ENCOUNTER — Emergency Department (HOSPITAL_COMMUNITY)
Admission: EM | Admit: 2017-04-05 | Discharge: 2017-04-05 | Disposition: A | Discharge status: Home / Self Care | Payer: Self-pay | Attending: Emergency Medicine | Admitting: Emergency Medicine

## 2017-04-05 ENCOUNTER — Encounter (HOSPITAL_COMMUNITY): Payer: Self-pay | Admitting: Emergency Medicine

## 2017-04-05 DIAGNOSIS — I10 Essential (primary) hypertension: Secondary | ICD-10-CM | POA: Insufficient documentation

## 2017-04-05 DIAGNOSIS — R0789 Other chest pain: Secondary | ICD-10-CM | POA: Insufficient documentation

## 2017-04-05 DIAGNOSIS — F1410 Cocaine abuse, uncomplicated: Secondary | ICD-10-CM | POA: Insufficient documentation

## 2017-04-05 DIAGNOSIS — Z79899 Other long term (current) drug therapy: Secondary | ICD-10-CM | POA: Insufficient documentation

## 2017-04-05 DIAGNOSIS — Z7982 Long term (current) use of aspirin: Secondary | ICD-10-CM | POA: Insufficient documentation

## 2017-04-05 LAB — CBC
HEMATOCRIT: 47.3 % (ref 39.0–52.0)
HEMOGLOBIN: 16 g/dL (ref 13.0–17.0)
MCH: 30.9 pg (ref 26.0–34.0)
MCHC: 33.8 g/dL (ref 30.0–36.0)
MCV: 91.5 fL (ref 78.0–100.0)
Platelets: 276 10*3/uL (ref 150–400)
RBC: 5.17 MIL/uL (ref 4.22–5.81)
RDW: 13.5 % (ref 11.5–15.5)
WBC: 8.9 10*3/uL (ref 4.0–10.5)

## 2017-04-05 LAB — BASIC METABOLIC PANEL
ANION GAP: 8 (ref 5–15)
BUN: 11 mg/dL (ref 6–20)
CO2: 22 mmol/L (ref 22–32)
Calcium: 8.3 mg/dL — ABNORMAL LOW (ref 8.9–10.3)
Chloride: 108 mmol/L (ref 101–111)
Creatinine, Ser: 1.18 mg/dL (ref 0.61–1.24)
GFR calc Af Amer: >60 mL/min (ref >60)
GLUCOSE: 87 mg/dL (ref 65–99)
POTASSIUM: 4.2 mmol/L (ref 3.5–5.1)
SODIUM: 138 mmol/L (ref 135–145)

## 2017-04-05 LAB — I-STAT TROPONIN, ED
TROPONIN I, POC: 0.04 ng/mL (ref 0.00–0.08)
TROPONIN I, POC: 0.05 ng/mL (ref 0.00–0.08)
Troponin i, poc: 0 ng/mL (ref 0.00–0.08)

## 2017-04-05 NOTE — Discharge Instructions (Signed)
It was my pleasure taking care of you today!   You were seen in the Emergency Department today for chest pain.  It is very important that you quit using cocaine. As we have discussed, today?s blood work and imaging are normal, but you may require further testing.  Please call the cardiology clinic listed to schedule a follow up appointment for further evaluation of the chest pain you experienced today.   Return to the Emergency Department if you experience any further chest pain/pressure/tightness, difficulty breathing, sudden sweating, or other symptoms that concern you.

## 2017-04-05 NOTE — ED Triage Notes (Signed)
Patient from [facility] (states he was visiting someone, does not live there) with [facility] for 6/10 chest pain and shortness of breath that woke him up from sleep 30 minutes ago. Dull pain in center of chest. Received 324 mg aspirin and 1 SL NTG PTA, pain currently 2/10.  SOB resolved. Reports cocaine use, last use one week ago. Denies use today. Alert and oriented and in no apparent distress at this time. 20g saline lock in left hand from EMS.

## 2017-04-05 NOTE — ED Provider Notes (Signed)
[facility] Provider Note   CSN: [id number] Arrival date & time: 04/05/17  [id number]     History   Chief Complaint Chief Complaint  Patient presents with  . Chest Pain    HPI Bryce Ortega is a 56 y.o. male.  The history is provided by the patient and medical records. No language interpreter was used.  Chest Pain   Associated symptoms include diaphoresis and shortness of breath. Pertinent negatives include no cough, no fever and no palpitations.   Bryce GivensRobert V Ortega is a 56 y.o. male  with a PMH of prior cardiac arrest, CAD, cocaine use, HTN, HLD who presents to the Emergency Department complaining of acute onset of central non-radiating chest pain which awoke him from his sleep this morning. Associated symptoms include shortness of breath and diaphoresis. Patient was given nitro and 324 ASA by EMS and notes that he is currently chest pain free. Shortness of breath improved with nitro as well. Patient now with no complaints. Patient states that he does still use cocaine somewhat frequently with last use the night before last (10/20).   Past Medical History:  Diagnosis Date  . Cocaine abuse (HCC)   . High cholesterol   . Hypertension     Patient Active Problem List   Diagnosis Date Noted  . Cocaine adverse reaction 02/23/2017  . Chest pain 02/22/2017  . Mild CAD 02/22/2017  . NSTEMI (non-ST elevated myocardial infarction) (HCC) 05/15/2015  . Cocaine abuse (HCC) 05/15/2015  . Chest pain at rest   . Cardiac arrest (HCC) 05/10/2015  . Hyperlipidemia 05/10/2015  . Essential hypertension 01/10/2009  . SHOULDER PAIN, RIGHT 01/10/2009  . CERVICALGIA 01/10/2009  . LOW BACK PAIN 01/10/2009    Past Surgical History:  Procedure Laterality Date  . CARDIAC CATHETERIZATION N/A 05/10/2015   Procedure: Left Heart Cath and Coronary Angiography;  Surgeon: Dolores Pattyaniel R Bensimhon, MD;  [facility];  Service: Cardiovascular;  Laterality:  N/A;  . TONSILLECTOMY         Home Medications    Prior to Admission medications   Medication Sig Start Date End Date Taking? Authorizing Provider  amLODipine (NORVASC) 2.5 MG tablet Take 1 tablet (2.5 mg total) by mouth daily. 05/16/15  Yes Dhungel, Nishant, MD  aspirin EC 81 MG tablet Take 1 tablet (81 mg total) by mouth daily. 05/11/15  Yes Ghimire, Werner LeanShanker M, MD  losartan (COZAAR) 25 MG tablet Take 1 tablet (25 mg total) by mouth daily. 05/16/15  Yes Dhungel, Nishant, MD  nitroGLYCERIN (NITROSTAT) 0.4 MG SL tablet Place 1 tablet (0.4 mg total) under the tongue every 5 (five) minutes as needed for chest pain (for 3 doses). 05/11/15  Yes Ghimire, Werner LeanShanker M, MD  simvastatin (ZOCOR) 20 MG tablet Take 1 tablet (20 mg total) by mouth daily at 6 PM. Patient taking differently: Take 20 mg by mouth daily.  05/11/15  Yes Ghimire, Werner LeanShanker M, MD    Family History Family History  Problem Relation Age of Onset  . Hypertension Father   . Heart disease Father   . Heart disease Mother   . Heart disease Maternal Grandfather     Social History Social History  Substance Use Topics  . Smoking status: Never Smoker  . Smokeless tobacco: Never Used  . Alcohol use Yes     Comment: 1-2 40oz beers 3-4 times a week     Allergies   Propoxyphene n-acetaminophen   Review of Systems Review of Systems  Constitutional: Positive for diaphoresis. Negative for chills and fever.  Respiratory: Positive for shortness of breath. Negative for cough and wheezing.   Cardiovascular: Positive for chest pain. Negative for palpitations and leg swelling.  All other systems reviewed and are negative.    Physical Exam Updated Vital Signs BP 131/87   Pulse (!) 58   Temp (!) 97.5 F (36.4 C) (Oral)   Resp 18   Ht 5\' 10"  (1.778 m)   Wt 70.3 kg (155 lb)   SpO2 100%   BMI 22.24 kg/m   Physical Exam  Constitutional: He is oriented to person, place, and time. He appears well-developed and well-nourished. No  distress.  HENT:  Head: Normocephalic and atraumatic.  Neck: No JVD present.  Cardiovascular: Normal rate, regular rhythm and normal heart sounds.   No murmur heard. Pulmonary/Chest: Effort normal and breath sounds normal. No respiratory distress.  Abdominal: Soft. He exhibits no distension. There is no tenderness.  Musculoskeletal: He exhibits no edema.  Neurological: He is alert and oriented to person, place, and time.  Skin: Skin is warm and dry.  Nursing note and vitals reviewed.    ED Treatments / Results  Labs (all labs ordered are listed, but only abnormal results are displayed) Labs Reviewed  BASIC METABOLIC PANEL - Abnormal; Notable for the following:       Result Value   Calcium 8.3 (*)    All other components within normal limits  CBC  I-STAT TROPONIN, ED  I-STAT TROPONIN, ED  I-STAT TROPONIN, ED    EKG  EKG Interpretation  Date/Time:  Monday April 05 2017 13:58:46 EDT Ventricular Rate:  58 PR Interval:    QRS Duration: 74 QT Interval:  417 QTC Calculation: 410 R Axis:   69 Text Interpretation:  Sinus rhythm Anteroseptal infarct, old Minimal ST elevation, inferior leads no significant change since earlier in the day Confirmed by Pricilla Loveless [phone]) on 04/05/2017 4:05:48 PM       Radiology Dg Chest 2 View  Result Date: 04/05/2017 CLINICAL DATA:  Chest pain and shortness of breath EXAM: CHEST  2 VIEW COMPARISON:  February 22, 2017 FINDINGS: There is no edema or consolidation. Heart size and pulmonary vascularity are normal. No adenopathy. No pneumothorax. There is mild upper thoracic levoscoliosis. IMPRESSION: No edema or consolidation. Electronically Signed   By: Bretta Bang III M.D.   On: 04/05/2017 11:00    Procedures Procedures (including critical care time)  Medications Ordered in ED Medications - No data to display   Initial Impression / Assessment and Plan / ED Course  I have reviewed the triage vital signs and the nursing  notes.  Pertinent labs & imaging results that were available during my care of the patient were reviewed by me and considered in my medical decision making (see chart for details).    Bryce Ortega is a 56 y.o. male who presents to ED for acute onset of central chest pain which began this morning. Patient does have hx of cocaine abuse and prior v.fib arrest likely 2/2 cocaine-induced vasospasm. Patient does endorse cocaine use two days ago, but none yesterday or today. On exam, patient is afebrile, hemodynamically stable with normal cardiopulmonary examination. He was given nitro and endorses feeling improved about 10 minutes later. He is currently chest pain free. Initial troponin 0.0 and EKG unchanged from previous. CXR with no acute findings. 2nd troponin three hours later obtained and wdl however has increased from 0.0 to 0.04. Repeat EKG obtained which is  reassuring. History is atypical for ACS, however given PMH, a third troponin was obtained and also wdl. After observation in ED 6+ hours, patient has been chest pain free throughout entire ED stay. Doubt ACS. Evaluation does not show pathology that would require ongoing emergent intervention or inpatient treatment. Will have patient follow up with cardiology. Strict return precautions given. All questions answered.    Patient seen by and discussed with Dr. Criss Alvine who agrees with treatment plan.   Final Clinical Impressions(s) / ED Diagnoses   Final diagnoses:  Atypical chest pain  Cocaine abuse [facility])    New Prescriptions New Prescriptions   No medications on file     Lj Miyamoto, Chase Picket, PA-C 04/05/17 1614    Pricilla Loveless, MD 04/05/17 [phone]

## 2017-04-16 ENCOUNTER — Emergency Department (HOSPITAL_COMMUNITY)
Admission: EM | Admit: 2017-04-16 | Discharge: 2017-04-16 | Disposition: A | Discharge status: Home / Self Care | Payer: Self-pay | Attending: Emergency Medicine | Admitting: Emergency Medicine

## 2017-04-16 ENCOUNTER — Encounter (HOSPITAL_COMMUNITY): Payer: Self-pay

## 2017-04-16 ENCOUNTER — Emergency Department (HOSPITAL_COMMUNITY): Payer: Self-pay

## 2017-04-16 DIAGNOSIS — Z8674 Personal history of sudden cardiac arrest: Secondary | ICD-10-CM | POA: Insufficient documentation

## 2017-04-16 DIAGNOSIS — I252 Old myocardial infarction: Secondary | ICD-10-CM | POA: Insufficient documentation

## 2017-04-16 DIAGNOSIS — R0602 Shortness of breath: Secondary | ICD-10-CM | POA: Insufficient documentation

## 2017-04-16 DIAGNOSIS — I10 Essential (primary) hypertension: Secondary | ICD-10-CM | POA: Insufficient documentation

## 2017-04-16 DIAGNOSIS — R110 Nausea: Secondary | ICD-10-CM | POA: Insufficient documentation

## 2017-04-16 DIAGNOSIS — F1410 Cocaine abuse, uncomplicated: Secondary | ICD-10-CM | POA: Insufficient documentation

## 2017-04-16 DIAGNOSIS — Z7982 Long term (current) use of aspirin: Secondary | ICD-10-CM | POA: Insufficient documentation

## 2017-04-16 DIAGNOSIS — Z79899 Other long term (current) drug therapy: Secondary | ICD-10-CM | POA: Insufficient documentation

## 2017-04-16 DIAGNOSIS — R0789 Other chest pain: Secondary | ICD-10-CM | POA: Insufficient documentation

## 2017-04-16 LAB — CBC WITH DIFFERENTIAL/PLATELET
BASOS ABS: 0.1 10*3/uL (ref 0.0–0.1)
Basophils Relative: 1 %
Eosinophils Absolute: 0.5 10*3/uL (ref 0.0–0.7)
Eosinophils Relative: 7 %
HCT: 49.2 % (ref 39.0–52.0)
HEMOGLOBIN: 16.8 g/dL (ref 13.0–17.0)
LYMPHS ABS: 1 10*3/uL (ref 0.7–4.0)
LYMPHS PCT: 15 %
MCH: 31.2 pg (ref 26.0–34.0)
MCHC: 34.1 g/dL (ref 30.0–36.0)
MCV: 91.3 fL (ref 78.0–100.0)
Monocytes Absolute: 0.7 10*3/uL (ref 0.1–1.0)
Monocytes Relative: 11 %
NEUTROS PCT: 66 %
Neutro Abs: 4.5 10*3/uL (ref 1.7–7.7)
PLATELETS: 297 10*3/uL (ref 150–400)
RBC: 5.39 MIL/uL (ref 4.22–5.81)
RDW: 13.4 % (ref 11.5–15.5)
WBC: 6.8 10*3/uL (ref 4.0–10.5)

## 2017-04-16 LAB — BASIC METABOLIC PANEL
Anion gap: 9 (ref 5–15)
BUN: 20 mg/dL (ref 6–20)
CALCIUM: 8.7 mg/dL — AB (ref 8.9–10.3)
CHLORIDE: 105 mmol/L (ref 101–111)
CO2: 24 mmol/L (ref 22–32)
CREATININE: 1.28 mg/dL — AB (ref 0.61–1.24)
Glucose, Bld: 121 mg/dL — ABNORMAL HIGH (ref 65–99)
Potassium: 3.9 mmol/L (ref 3.5–5.1)
SODIUM: 138 mmol/L (ref 135–145)

## 2017-04-16 LAB — D-DIMER, QUANTITATIVE (NOT AT ARMC)

## 2017-04-16 LAB — TROPONIN I: Troponin I: <0.03 ng/mL (ref <0.03)

## 2017-04-16 MED ORDER — ASPIRIN 81 MG PO CHEW
324.0000 mg | CHEWABLE_TABLET | Freq: Once | ORAL | Status: DC
  Filled 2017-04-16: qty 4

## 2017-04-16 MED ORDER — SODIUM CHLORIDE 0.9 % IV SOLN
INTRAVENOUS | Status: DC
  Administered 2017-04-16: 10:00:00 via INTRAVENOUS

## 2017-04-16 MED ORDER — LORAZEPAM 2 MG/ML IJ SOLN
1.0000 mg | Freq: Once | INTRAMUSCULAR | Status: AC
  Administered 2017-04-16: 1 mg via INTRAVENOUS
  Filled 2017-04-16: qty 1

## 2017-04-16 MED ORDER — METAXALONE 800 MG PO TABS: 800.0000 mg | ORAL_TABLET | Freq: Three times a day (TID) | ORAL | 0 refills | Status: DC

## 2017-04-16 NOTE — ED Notes (Signed)
Bed: WA20 Expected date:  Expected time:  Means of arrival:  Comments: 56 yo m, shortness of breath

## 2017-04-16 NOTE — ED Provider Notes (Signed)
[facility]-EMERGENCY DEPT Provider Note   CSN: [id number] Arrival date & time: 04/16/17  3428     History   Chief Complaint Chief Complaint  Patient presents with  . Shortness of Breath  . Chest Pain    HPI Bryce Ortega is a 56 y.o. male.  56 year old male presents with acute onset of sharp diffuse chest pain which began today at work.  It was associated with dyspnea as well as nausea.  No diaphoresis.  Pain is worse with movement and better with remaining still.  He works in a [employer].  Does admit to cocaine use last night but denies any current use today.  No recent fever cough or congestion.  Pain is better with remaining still.  Did have chest pain a week ago when he used cocaine but denies any chest pain with cocaine use yesterday.  EMS was called and patient transported here.  No treatment was given      Past Medical History:  Diagnosis Date  . Cocaine abuse (HCC)   . High cholesterol   . Hypertension     Patient Active Problem List   Diagnosis Date Noted  . Cocaine adverse reaction 02/23/2017  . Chest pain 02/22/2017  . Mild CAD 02/22/2017  . NSTEMI (non-ST elevated myocardial infarction) (HCC) 05/15/2015  . Cocaine abuse (HCC) 05/15/2015  . Chest pain at rest   . Cardiac arrest (HCC) 05/10/2015  . Hyperlipidemia 05/10/2015  . Essential hypertension 01/10/2009  . SHOULDER PAIN, RIGHT 01/10/2009  . CERVICALGIA 01/10/2009  . LOW BACK PAIN 01/10/2009    Past Surgical History:  Procedure Laterality Date  . CARDIAC CATHETERIZATION N/A 05/10/2015   Procedure: Left Heart Cath and Coronary Angiography;  Surgeon: Dolores Patty, MD;  Location: [facility] INVASIVE CV LAB;  Service: Cardiovascular;  Laterality: N/A;  . TONSILLECTOMY         Home Medications    Prior to Admission medications   Medication Sig Start Date End Date Taking? Authorizing Provider  amLODipine (NORVASC) 2.5 MG tablet Take 1 tablet (2.5 mg total) by mouth daily.  05/16/15   Dhungel, Theda Belfast, MD  aspirin EC 81 MG tablet Take 1 tablet (81 mg total) by mouth daily. 05/11/15   Ghimire, Werner Lean, MD  losartan (COZAAR) 25 MG tablet Take 1 tablet (25 mg total) by mouth daily. 05/16/15   Dhungel, Theda Belfast, MD  nitroGLYCERIN (NITROSTAT) 0.4 MG SL tablet Place 1 tablet (0.4 mg total) under the tongue every 5 (five) minutes as needed for chest pain (for 3 doses). 05/11/15   Ghimire, Werner Lean, MD  simvastatin (ZOCOR) 20 MG tablet Take 1 tablet (20 mg total) by mouth daily at 6 PM. Patient taking differently: Take 20 mg by mouth daily.  05/11/15   GhimireWerner Lean, MD    Family History Family History  Problem Relation Age of Onset  . Hypertension Father   . Heart disease Father   . Heart disease Mother   . Heart disease Maternal Grandfather     Social History Social History  Substance Use Topics  . Smoking status: Never Smoker  . Smokeless tobacco: Never Used  . Alcohol use Yes     Comment: 1-2 40oz beers 3-4 times a week     Allergies   Propoxyphene n-acetaminophen   Review of Systems Review of Systems  All other systems reviewed and are negative.    Physical Exam Updated Vital Signs BP (!) 142/94   Pulse 77   Temp (!)  97.5 F (36.4 C) (Oral)   Resp 17   Ht 1.778 m (5\' 10" )   Wt 70.3 kg (155 lb)   SpO2 99%   BMI 22.24 kg/m   Physical Exam  Constitutional: He is oriented to person, place, and time. He appears well-developed and well-nourished.  Non-toxic appearance. No distress.  HENT:  Head: Normocephalic and atraumatic.  Eyes: Pupils are equal, round, and reactive to light. Conjunctivae, EOM and lids are normal.  Neck: Normal range of motion. Neck supple. No tracheal deviation present. No thyroid mass present.  Cardiovascular: Normal rate, regular rhythm and normal heart sounds.  Exam reveals no gallop.   No murmur heard. Pulmonary/Chest: Effort normal and breath sounds normal. No stridor. No respiratory distress. He has no  decreased breath sounds. He has no wheezes. He has no rhonchi. He has no rales. He exhibits tenderness. He exhibits no crepitus.    Abdominal: Soft. Normal appearance and bowel sounds are normal. He exhibits no distension. There is no tenderness. There is no rebound and no CVA tenderness.  Musculoskeletal: Normal range of motion. He exhibits no edema or tenderness.  Neurological: He is alert and oriented to person, place, and time. He has normal strength. No cranial nerve deficit or sensory deficit. GCS eye subscore is 4. GCS verbal subscore is 5. GCS motor subscore is 6.  Skin: Skin is warm and dry. No abrasion and no rash noted.  Psychiatric: He has a normal mood and affect. His speech is normal and behavior is normal.  Nursing note and vitals reviewed.    ED Treatments / Results  Labs (all labs ordered are listed, but only abnormal results are displayed) Labs Reviewed  CBC WITH DIFFERENTIAL/PLATELET  BASIC METABOLIC PANEL  D-DIMER, QUANTITATIVE (NOT AT [facility])  TROPONIN I    EKG  EKG Interpretation None     ED ECG REPORT   Date: 04/16/2017  Rate: 70  Rhythm: normal sinus rhythm  QRS Axis: normal  Intervals: normal  ST/T Wave abnormalities: normal  Conduction Disutrbances:none  Narrative Interpretation:   Old EKG Reviewed: none available  I have personally reviewed the EKG tracing and agree with the computerized printout as noted.  Radiology No results found.  Procedures Procedures (including critical care time)  Medications Ordered in ED Medications  0.9 %  sodium chloride infusion (not administered)  LORazepam (ATIVAN) injection 1 mg (not administered)  aspirin chewable tablet 324 mg (not administered)     Initial Impression / Assessment and Plan / ED Course  I have reviewed the triage vital signs and the nursing notes.  Pertinent labs & imaging results that were available during my care of the patient were reviewed by me and considered in my medical  decision making (see chart for details).     Patient treated for musculoskeletal chest pain here and feels better.  His delta troponin is negative.  D-dimer negative.  Will discharge home with return precautions  Final Clinical Impressions(s) / ED Diagnoses   Final diagnoses:  SOB (shortness of breath)    New Prescriptions New Prescriptions   No medications on file     Lorre NickAllen, Jostin Rue, MD 04/16/17 1455

## 2017-04-16 NOTE — ED Triage Notes (Signed)
Per EMS, patient was at work (works at [employer]) and called out for SOB and CP. 9/10 pain mid sternal. Worse with palpation. No nausea or dizziness, lungs clear. This same thing happened 1 week ago the day after patient had cocaine. Pt states he had cocaine yesterday. Pt 99% on RA. EMS gave 324mg  asa, could not obtain IV access. EKG unremarkable. CBG 170. Pt A/O, able to follow commands. States pain is now 7/10.

## 2017-05-17 ENCOUNTER — Emergency Department (HOSPITAL_COMMUNITY): Payer: Self-pay

## 2017-05-17 ENCOUNTER — Inpatient Hospital Stay (HOSPITAL_COMMUNITY)
Admission: EM | Admit: 2017-05-17 | Discharge: 2017-05-19 | DRG: 313 | Disposition: A | Discharge status: Home / Self Care | Payer: Self-pay | Attending: Family Medicine | Admitting: Family Medicine

## 2017-05-17 ENCOUNTER — Encounter (HOSPITAL_COMMUNITY): Payer: Self-pay

## 2017-05-17 ENCOUNTER — Other Ambulatory Visit: Payer: Self-pay

## 2017-05-17 DIAGNOSIS — E785 Hyperlipidemia, unspecified: Secondary | ICD-10-CM | POA: Diagnosis present

## 2017-05-17 DIAGNOSIS — R079 Chest pain, unspecified: Secondary | ICD-10-CM | POA: Diagnosis present

## 2017-05-17 DIAGNOSIS — I10 Essential (primary) hypertension: Secondary | ICD-10-CM | POA: Diagnosis present

## 2017-05-17 DIAGNOSIS — R7989 Other specified abnormal findings of blood chemistry: Secondary | ICD-10-CM

## 2017-05-17 DIAGNOSIS — E7800 Pure hypercholesterolemia, unspecified: Secondary | ICD-10-CM | POA: Diagnosis present

## 2017-05-17 DIAGNOSIS — I340 Nonrheumatic mitral (valve) insufficiency: Secondary | ICD-10-CM | POA: Diagnosis present

## 2017-05-17 DIAGNOSIS — Z7982 Long term (current) use of aspirin: Secondary | ICD-10-CM

## 2017-05-17 DIAGNOSIS — I2510 Atherosclerotic heart disease of native coronary artery without angina pectoris: Secondary | ICD-10-CM | POA: Diagnosis present

## 2017-05-17 DIAGNOSIS — Z79899 Other long term (current) drug therapy: Secondary | ICD-10-CM

## 2017-05-17 DIAGNOSIS — F1410 Cocaine abuse, uncomplicated: Secondary | ICD-10-CM | POA: Diagnosis present

## 2017-05-17 DIAGNOSIS — R778 Other specified abnormalities of plasma proteins: Secondary | ICD-10-CM | POA: Diagnosis present

## 2017-05-17 DIAGNOSIS — R072 Precordial pain: Secondary | ICD-10-CM | POA: Diagnosis present

## 2017-05-17 DIAGNOSIS — Z886 Allergy status to analgesic agent status: Secondary | ICD-10-CM

## 2017-05-17 DIAGNOSIS — R0789 Other chest pain: Principal | ICD-10-CM | POA: Diagnosis present

## 2017-05-17 HISTORY — DX: Nonrheumatic mitral (valve) insufficiency: I34.0

## 2017-05-17 LAB — RAPID URINE DRUG SCREEN, HOSP PERFORMED
AMPHETAMINES: NOT DETECTED
BARBITURATES: NOT DETECTED
BENZODIAZEPINES: NOT DETECTED
Cocaine: POSITIVE — AB
Opiates: NOT DETECTED
Tetrahydrocannabinol: NOT DETECTED

## 2017-05-17 LAB — BASIC METABOLIC PANEL
ANION GAP: 8 (ref 5–15)
BUN: 16 mg/dL (ref 6–20)
CALCIUM: 8.6 mg/dL — AB (ref 8.9–10.3)
CO2: 25 mmol/L (ref 22–32)
Chloride: 104 mmol/L (ref 101–111)
Creatinine, Ser: 1.11 mg/dL (ref 0.61–1.24)
GFR calc Af Amer: >60 mL/min (ref >60)
GFR calc non Af Amer: >60 mL/min (ref >60)
GLUCOSE: 118 mg/dL — AB (ref 65–99)
Potassium: 4.5 mmol/L (ref 3.5–5.1)
Sodium: 137 mmol/L (ref 135–145)

## 2017-05-17 LAB — CBC
HCT: 49.7 % (ref 39.0–52.0)
HEMOGLOBIN: 17 g/dL (ref 13.0–17.0)
MCH: 31.5 pg (ref 26.0–34.0)
MCHC: 34.2 g/dL (ref 30.0–36.0)
MCV: 92 fL (ref 78.0–100.0)
Platelets: 287 10*3/uL (ref 150–400)
RBC: 5.4 MIL/uL (ref 4.22–5.81)
RDW: 13.6 % (ref 11.5–15.5)
WBC: 8.7 10*3/uL (ref 4.0–10.5)

## 2017-05-17 LAB — I-STAT TROPONIN, ED: TROPONIN I, POC: 0 ng/mL (ref 0.00–0.08)

## 2017-05-17 LAB — TROPONIN I
Troponin I: 0.07 ng/mL (ref <0.03)
Troponin I: 0.05 ng/mL (ref <0.03)

## 2017-05-17 MED ORDER — ONDANSETRON HCL 4 MG/2ML IJ SOLN: 4.0000 mg | Freq: Four times a day (QID) | INTRAMUSCULAR | Status: DC | PRN

## 2017-05-17 MED ORDER — ACETAMINOPHEN 325 MG PO TABS
650.0000 mg | ORAL_TABLET | ORAL | Status: DC | PRN
Start: 2017-05-17 — End: 2017-05-19

## 2017-05-17 MED ORDER — NITROGLYCERIN 0.4 MG SL SUBL: 0.4000 mg | SUBLINGUAL_TABLET | SUBLINGUAL | Status: DC | PRN

## 2017-05-17 MED ORDER — ASPIRIN EC 81 MG PO TBEC
81.0000 mg | DELAYED_RELEASE_TABLET | Freq: Every day | ORAL | Status: DC
  Administered 2017-05-18 – 2017-05-19 (×2): 81 mg via ORAL
  Filled 2017-05-17 (×3): qty 1

## 2017-05-17 MED ORDER — METAXALONE 800 MG PO TABS
800.0000 mg | ORAL_TABLET | Freq: Three times a day (TID) | ORAL | Status: DC
  Filled 2017-05-17 (×6): qty 1

## 2017-05-17 MED ORDER — AMLODIPINE BESYLATE 5 MG PO TABS
2.5000 mg | ORAL_TABLET | Freq: Every day | ORAL | Status: DC
  Administered 2017-05-18: 2.5 mg via ORAL
  Filled 2017-05-17: qty 1

## 2017-05-17 MED ORDER — LOSARTAN POTASSIUM 25 MG PO TABS
25.0000 mg | ORAL_TABLET | Freq: Every day | ORAL | Status: DC
  Administered 2017-05-18 – 2017-05-19 (×2): 25 mg via ORAL
  Filled 2017-05-17 (×2): qty 1

## 2017-05-17 NOTE — ED Notes (Signed)
Handoff @ 2040

## 2017-05-17 NOTE — ED Notes (Signed)
Patient wife requesting to speak with admitted MD. MD paged and wife is talking to MD on phone.

## 2017-05-17 NOTE — ED Notes (Signed)
Unable to collect all labs at this time

## 2017-05-17 NOTE — ED Triage Notes (Signed)
Patient c/o pressure of the mid chest and SOB. Patient states he was out of NTG. Patient reports a history of MI.

## 2017-05-17 NOTE — ED Provider Notes (Signed)
[facility]-EMERGENCY DEPT Provider Note   CSN: [id number] Arrival date & time: 05/17/17  [id number]     History   Chief Complaint Chief Complaint  Patient presents with  . Chest Pain  . Shortness of Breath    HPI Crawford GivensRobert V Habermehl is a 56 y.o. male.  56 year old male presents with acute onset of chest pressure which occurred while at work today.  States he was getting out of his truck and all of a sudden had about an hour of chest pressure with associated diaphoresis and dyspnea.  Does have a history of ventricular fibrillation arrest secondary to vasospasm from cocaine use.  He denies any cocaine use at this time.  Seen by myself about a month ago for chest wall pain he denies those symptoms at this time.  Pain was relieved with rest and without intervention.  States he ran out of this nitroglycerin.  Last time he had chest pain was when I saw him.      Past Medical History:  Diagnosis Date  . Cocaine abuse (HCC)   . High cholesterol   . Hypertension   . MI (mitral incompetence)     Patient Active Problem List   Diagnosis Date Noted  . Cocaine adverse reaction 02/23/2017  . Chest pain 02/22/2017  . Mild CAD 02/22/2017  . NSTEMI (non-ST elevated myocardial infarction) (HCC) 05/15/2015  . Cocaine abuse (HCC) 05/15/2015  . Chest pain at rest   . Cardiac arrest (HCC) 05/10/2015  . Hyperlipidemia 05/10/2015  . Essential hypertension 01/10/2009  . SHOULDER PAIN, RIGHT 01/10/2009  . CERVICALGIA 01/10/2009  . LOW BACK PAIN 01/10/2009    Past Surgical History:  Procedure Laterality Date  . CARDIAC CATHETERIZATION N/A 05/10/2015   Procedure: Left Heart Cath and Coronary Angiography;  Surgeon: Dolores Pattyaniel R Bensimhon, MD;  [facility];  Service: Cardiovascular;  Laterality: N/A;  . TONSILLECTOMY         Home Medications    Prior to Admission medications   Medication Sig Start Date End Date Taking? Authorizing Provider  amLODipine  (NORVASC) 2.5 MG tablet Take 1 tablet (2.5 mg total) by mouth daily. 05/16/15   Dhungel, Theda BelfastNishant, MD  aspirin EC 81 MG tablet Take 1 tablet (81 mg total) by mouth daily. 05/11/15   Ghimire, Werner LeanShanker M, MD  losartan (COZAAR) 25 MG tablet Take 1 tablet (25 mg total) by mouth daily. 05/16/15   Dhungel, Theda BelfastNishant, MD  metaxalone (SKELAXIN) 800 MG tablet Take 1 tablet (800 mg total) by mouth 3 (three) times daily. 04/16/17   Lorre NickAllen, Sanaii Caporaso, MD  nitroGLYCERIN (NITROSTAT) 0.4 MG SL tablet Place 1 tablet (0.4 mg total) under the tongue every 5 (five) minutes as needed for chest pain (for 3 doses). Patient not taking: Reported on 04/16/2017 05/11/15   Maretta BeesGhimire, Shanker M, MD  simvastatin (ZOCOR) 20 MG tablet Take 1 tablet (20 mg total) by mouth daily at 6 PM. Patient taking differently: Take 20 mg by mouth daily.  05/11/15   GhimireWerner Lean, Shanker M, MD    Family History Family History  Problem Relation Age of Onset  . Hypertension Father   . Heart disease Father   . Heart disease Mother   . Heart disease Maternal Grandfather     Social History Social History   Tobacco Use  . Smoking status: Never Smoker  . Smokeless tobacco: Never Used  Substance Use Topics  . Alcohol use: Yes    Comment: 1-2 40oz beers 3-4 times a week  .  Drug use: No    Comment: cocaine last use 03/29/17     Allergies   Propoxyphene n-acetaminophen   Review of Systems Review of Systems  All other systems reviewed and are negative.    Physical Exam Updated Vital Signs BP (!) 170/109 (BP Location: Left Arm)   Pulse 74   Temp (!) 97.4 F (36.3 C) (Oral)   Resp 16   Ht 1.778 m (5\' 10" )   Wt 72.6 kg (160 lb)   SpO2 100%   BMI 22.96 kg/m   Physical Exam  Constitutional: He is oriented to person, place, and time. He appears well-developed and well-nourished.  Non-toxic appearance. No distress.  HENT:  Head: Normocephalic and atraumatic.  Eyes: Conjunctivae, EOM and lids are normal. Pupils are equal, round, and  reactive to light.  Neck: Normal range of motion. Neck supple. No tracheal deviation present. No thyroid mass present.  Cardiovascular: Normal rate, regular rhythm and normal heart sounds. Exam reveals no gallop.  No murmur heard. Pulmonary/Chest: Effort normal and breath sounds normal. No stridor. No respiratory distress. He has no decreased breath sounds. He has no wheezes. He has no rhonchi. He has no rales.  Abdominal: Soft. Normal appearance and bowel sounds are normal. He exhibits no distension. There is no tenderness. There is no rebound and no CVA tenderness.  Musculoskeletal: Normal range of motion. He exhibits no edema or tenderness.  Neurological: He is alert and oriented to person, place, and time. He has normal strength. No cranial nerve deficit or sensory deficit. GCS eye subscore is 4. GCS verbal subscore is 5. GCS motor subscore is 6.  Skin: Skin is warm and dry. No abrasion and no rash noted.  Psychiatric: He has a normal mood and affect. His speech is normal and behavior is normal.  Nursing note and vitals reviewed.    ED Treatments / Results  Labs (all labs ordered are listed, but only abnormal results are displayed) Labs Reviewed  BASIC METABOLIC PANEL  CBC  RAPID URINE DRUG SCREEN, HOSP PERFORMED  I-STAT TROPONIN, ED    EKG  EKG Interpretation None      ED ECG REPORT   Date: 05/17/2017  Rate: 72  Rhythm: normal sinus rhythm  QRS Axis: normal  Intervals: normal  ST/T Wave abnormalities: normal  Conduction Disutrbances:none  Narrative Interpretation:   Old EKG Reviewed: none available  I have personally reviewed the EKG tracing and agree with the computerized printout as noted.  Radiology No results found.  Procedures Procedures (including critical care time)  Medications Ordered in ED Medications - No data to display   Initial Impression / Assessment and Plan / ED Course  I have reviewed the triage vital signs and the nursing  notes.  Pertinent labs & imaging results that were available during my care of the patient were reviewed by me and considered in my medical decision making (see chart for details).     Patient with UDS positive for cocaine.  He denies any use to me.  Will be admitted for further evaluation of his chest pain  Final Clinical Impressions(s) / ED Diagnoses   Final diagnoses:  None    ED Discharge Orders    None       Lorre Nick, MD 05/17/17 1338

## 2017-05-17 NOTE — ED Notes (Signed)
Patient given coke and waiting on dinner tray.

## 2017-05-17 NOTE — ED Notes (Signed)
Pharmacy to send pain medication as not in pyxis.

## 2017-05-17 NOTE — ED Notes (Signed)
hospitalist paged to notify abd elevated troponin and Sarah, RN made aware.

## 2017-05-17 NOTE — Progress Notes (Signed)
RN paged troponin. Troponins are trending downward. Echo in am.  KJKG, NP [facility]

## 2017-05-17 NOTE — H&P (Signed)
History and Physical    Bryce Ortega TMY:[id number] DOB: [birth year] DOA: 05/17/2017  PCP: Lavinia Sharps, NP  Patient coming from:  home  Chief Complaint:  Chest pain  HPI: Bryce Ortega is a 56 y.o. male with medical history significant of cocaine abuse, nonobstructive CAD, HTN comes in with sscp today that lasted less than 20 minutes that did not radiate and resolved spontaneously prior to arrival to ED.  He has ngt sl to take but ran out of this recently.  He did some cocaine about 4 days ago.  He denies routine use of cocaine.  Pt has h/o vtach arrest in the past after cocaine use due to coronary spasm and had cath in 2016 which did not require any intervention.  No fevers.  No sob.  No swelling in his legs.  Pt referred for admission for his chest pain.  Review of Systems: As per HPI otherwise 10 point review of systems negative.   Past Medical History:  Diagnosis Date  . Cocaine abuse (HCC)   . High cholesterol   . Hypertension   . MI (mitral incompetence)     Past Surgical History:  Procedure Laterality Date  . CARDIAC CATHETERIZATION N/A 05/10/2015   Procedure: Left Heart Cath and Coronary Angiography;  Surgeon: Dolores Patty, MD;  Location: [facility] INVASIVE CV LAB;  Service: Cardiovascular;  Laterality: N/A;  . TONSILLECTOMY       reports that  has never smoked. he has never used smokeless tobacco. He reports that he drinks alcohol. He reports that he does not use drugs.  Allergies  Allergen Reactions  . Propoxyphene N-Acetaminophen Nausea Only    REACTION: GI upset    Family History  Problem Relation Age of Onset  . Hypertension Father   . Heart disease Father   . Heart disease Mother   . Heart disease Maternal Grandfather     Prior to Admission medications   Medication Sig Start Date End Date Taking? Authorizing Provider  amLODipine (NORVASC) 2.5 MG tablet Take 1 tablet (2.5 mg total) by mouth daily. 05/16/15  Yes Dhungel, Nishant, MD  aspirin EC 81  MG tablet Take 1 tablet (81 mg total) by mouth daily. 05/11/15  Yes Ghimire, Werner Lean, MD  losartan (COZAAR) 25 MG tablet Take 1 tablet (25 mg total) by mouth daily. 05/16/15  Yes Dhungel, Nishant, MD  simvastatin (ZOCOR) 20 MG tablet Take 1 tablet (20 mg total) by mouth daily at 6 PM. Patient taking differently: Take 20 mg by mouth daily.  05/11/15  Yes Ghimire, Werner Lean, MD  metaxalone (SKELAXIN) 800 MG tablet Take 1 tablet (800 mg total) by mouth 3 (three) times daily. Patient not taking: Reported on 05/17/2017 04/16/17   Lorre Nick, MD  nitroGLYCERIN (NITROSTAT) 0.4 MG SL tablet Place 1 tablet (0.4 mg total) under the tongue every 5 (five) minutes as needed for chest pain (for 3 doses). 05/11/15   Maretta Bees, MD    Physical Exam: Vitals:   05/17/17 1219 05/17/17 1230 05/17/17 1300 05/17/17 1330  BP: (!) 150/100 (!) 144/103 (!) 129/99 (!) 137/92  Pulse: 78 67 75 65  Resp: 16 15 19 16   Temp:      TempSrc:      SpO2: 97% 97% 97% 96%  Weight:      Height:         Constitutional: NAD, calm, comfortable Vitals:   05/17/17 1219 05/17/17 1230 05/17/17 1300 05/17/17 1330  BP: (!) 150/100 Marland Kitchen)  144/103 (!) 129/99 (!) 137/92  Pulse: 78 67 75 65  Resp: 16 15 19 16   Temp:      TempSrc:      SpO2: 97% 97% 97% 96%  Weight:      Height:       Eyes: PERRL, lids and conjunctivae normal ENMT: Mucous membranes are moist. Posterior pharynx clear of any exudate or lesions.Normal dentition.  Neck: normal, supple, no masses, no thyromegaly Respiratory: clear to auscultation bilaterally, no wheezing, no crackles. Normal respiratory effort. No accessory muscle use.  Cardiovascular: Regular rate and rhythm, no murmurs / rubs / gallops. No extremity edema. 2+ pedal pulses. No carotid bruits.  Abdomen: no tenderness, no masses palpated. No hepatosplenomegaly. Bowel sounds positive.  Musculoskeletal: no clubbing / cyanosis. No joint deformity upper and lower extremities. Good ROM, no  contractures. Normal muscle tone.  Skin: no rashes, lesions, ulcers. No induration Neurologic: CN 2-12 grossly intact. Sensation intact, DTR normal. Strength 5/5 in all 4.  Psychiatric: Normal judgment and insight. Alert and oriented x 3. Normal mood.    Labs on Admission: I have personally reviewed following labs and imaging studies  CBC: Recent Labs  Lab 05/17/17 1025  WBC 8.7  HGB 17.0  HCT 49.7  MCV 92.0  PLT 287   Basic Metabolic Panel: Recent Labs  Lab 05/17/17 1025  NA 137  K 4.5  CL 104  CO2 25  GLUCOSE 118*  BUN 16  CREATININE 1.11  CALCIUM 8.6*   GFR: Estimated Creatinine Clearance: 76.3 mL/min (by C-G formula based on SCr of 1.11 mg/dL). Liver Function Tests: No results for input(s): AST, ALT, ALKPHOS, BILITOT, PROT, ALBUMIN in the last 168 hours. No results for input(s): LIPASE, AMYLASE in the last 168 hours. No results for input(s): AMMONIA in the last 168 hours. Coagulation Profile: No results for input(s): INR, PROTIME in the last 168 hours. Cardiac Enzymes: Recent Labs  Lab 05/17/17 1145  TROPONINI <0.03   BNP (last 3 results) No results for input(s): PROBNP in the last 8760 hours. HbA1C: No results for input(s): HGBA1C in the last 72 hours. CBG: No results for input(s): GLUCAP in the last 168 hours. Lipid Profile: No results for input(s): CHOL, HDL, LDLCALC, TRIG, CHOLHDL, LDLDIRECT in the last 72 hours. Thyroid Function Tests: No results for input(s): TSH, T4TOTAL, FREET4, T3FREE, THYROIDAB in the last 72 hours. Anemia Panel: No results for input(s): VITAMINB12, FOLATE, FERRITIN, TIBC, IRON, RETICCTPCT in the last 72 hours. Urine analysis: No results found for: COLORURINE, APPEARANCEUR, LABSPEC, PHURINE, GLUCOSEU, HGBUR, BILIRUBINUR, KETONESUR, PROTEINUR, UROBILINOGEN, NITRITE, LEUKOCYTESUR Sepsis Labs: !!!!!!!!!!!!!!!!!!!!!!!!!!!!!!!!!!!!!!!!!!!! @LABRCNTIP (procalcitonin:4,lacticidven:4) )No results found for this or any previous visit  (from the past 240 hour(s)).   Radiological Exams on Admission: Dg Chest 2 View  Result Date: 05/17/2017 CLINICAL DATA:  Upper chest pain began earlier today. EXAM: CHEST  2 VIEW COMPARISON:  04/16/2017. FINDINGS: The heart size and mediastinal contours are within normal limits. Both lungs are clear. The visualized skeletal structures are unremarkable. IMPRESSION: No active cardiopulmonary disease. Stable appearance compared with priors. Electronically Signed   By: Elsie StainJohn T Curnes M.D.   On: 05/17/2017 12:21    EKG: reportedly nsr by dr Freida Busmanallen in ED, however it is not available in Epic for some reason cxr reviewed no edema or infiltrate Case discussed with edp  Assessment/Plan 56 yo male with chest pain likely related to cocaine usage  Principal Problem:   Chest pain- romi, serial trop.  Aspirin.  Cardiac echo in the am.  Stop cocaine use.  Active Problems:   Essential hypertension- stable   Hyperlipidemia- ck flp in am   Cocaine abuse (HCC)- advised to stop   Mild CAD- per cath in 2016    DVT prophylaxis:  scds Code Status:  full Family Communication:  none Disposition Plan:  Per day team Consults called:  none Admission status:  observation   Audry Kauzlarich A MD [facility]  If 7PM-7AM, please contact night-coverage [email or web address] Password [email or web address]  05/17/2017, 1:45 PM

## 2017-05-17 NOTE — Progress Notes (Signed)
2nd and 3rd troponins elevated at 0.07 and 0.05. On call Craige Cotta notified. No new orders at this time. Maila Dukes, Lavone Orn, RN

## 2017-05-18 ENCOUNTER — Observation Stay (HOSPITAL_BASED_OUTPATIENT_CLINIC_OR_DEPARTMENT_OTHER): Payer: Self-pay

## 2017-05-18 DIAGNOSIS — R778 Other specified abnormalities of plasma proteins: Secondary | ICD-10-CM

## 2017-05-18 DIAGNOSIS — R079 Chest pain, unspecified: Secondary | ICD-10-CM

## 2017-05-18 DIAGNOSIS — E785 Hyperlipidemia, unspecified: Secondary | ICD-10-CM

## 2017-05-18 DIAGNOSIS — R7989 Other specified abnormal findings of blood chemistry: Secondary | ICD-10-CM

## 2017-05-18 DIAGNOSIS — I10 Essential (primary) hypertension: Secondary | ICD-10-CM

## 2017-05-18 DIAGNOSIS — R9431 Abnormal electrocardiogram [ECG] [EKG]: Secondary | ICD-10-CM

## 2017-05-18 DIAGNOSIS — F1410 Cocaine abuse, uncomplicated: Secondary | ICD-10-CM

## 2017-05-18 LAB — ECHOCARDIOGRAM COMPLETE
Height: 70 in
Weight: 2447.99 [oz_av]

## 2017-05-18 LAB — MRSA PCR SCREENING: MRSA BY PCR: NEGATIVE

## 2017-05-18 LAB — TROPONIN I: Troponin I: 0.03 ng/mL

## 2017-05-18 MED ORDER — AMLODIPINE BESYLATE 5 MG PO TABS
5.0000 mg | ORAL_TABLET | Freq: Every day | ORAL | Status: DC
  Administered 2017-05-19: 5 mg via ORAL
  Filled 2017-05-18: qty 1

## 2017-05-18 MED ORDER — SIMVASTATIN 20 MG PO TABS
20.0000 mg | ORAL_TABLET | Freq: Once | ORAL | Status: AC
  Administered 2017-05-18: 20 mg via ORAL
  Filled 2017-05-18: qty 1

## 2017-05-18 MED ORDER — ISOSORBIDE MONONITRATE ER 30 MG PO TB24
30.0000 mg | ORAL_TABLET | Freq: Every day | ORAL | Status: DC
  Administered 2017-05-18 – 2017-05-19 (×2): 30 mg via ORAL
  Filled 2017-05-18 (×2): qty 1

## 2017-05-18 NOTE — Consult Note (Signed)
Cardiology Consultation:   Patient ID: Bryce GivensRobert V Ortega; [id number]; 06/12/1961   Admit date: 05/17/2017 Date of Consult: 05/18/2017  Primary Care Provider: Lavinia SharpsPlacey, Mary Ann, NP Primary Cardiologist: Dr. Gala RomneyBensimhon saw in hospital in 2016, new for Dr. Rennis GoldenHilty now   Patient Profile:   Bryce GivensRobert V Ortega is a 56 y.o. male with a hx of cocaine abuse, nonobstructive CAD, VT arrest in past due to coronary spasm after cocaine use, HTN, HLD who is being seen today for the evaluation of chest pain at the request of Dr. Sharl MaLama.  History of Present Illness:   Bryce Ortega presented with chest tightness with associated shortness of breath and breaking out in a cold sweat that lasted about an hour, did not radiate and resolved spontaneously just after arrival to the ED. He denies palpitations, nausea, lightheadedness. His last cocaine use was about 4 days prior. UDS was positive for cocaine. He says that he is not a daily user. He works at [employer] working with [employer] and states that he is usually quite active with moderate exertion at work without exertional symptoms. He denies orthopnea, PND or edema.   In 04/2015 the patient had acute chest pain followed by Torsades and and Vfib arrest in setting of cocaine use. He was defibrillated X1 with brief CPR. He was taken urgently to the cath lab and found to have non-obstructive CAD. His EKG did not show ischemic changes.   Past Medical History:  Diagnosis Date  . Cocaine abuse (HCC)   . High cholesterol   . Hypertension   . MI (mitral incompetence)     Past Surgical History:  Procedure Laterality Date  . CARDIAC CATHETERIZATION N/A 05/10/2015   Procedure: Left Heart Cath and Coronary Angiography;  Surgeon: Dolores Pattyaniel R Bensimhon, MD;  [facility] CV LAB;  Service: Cardiovascular;  Laterality: N/A;  . TONSILLECTOMY       Home Medications:  Prior to Admission medications   Medication Sig Start Date End Date Taking? Authorizing  Provider  amLODipine (NORVASC) 2.5 MG tablet Take 1 tablet (2.5 mg total) by mouth daily. 05/16/15  Yes Dhungel, Nishant, MD  aspirin EC 81 MG tablet Take 1 tablet (81 mg total) by mouth daily. 05/11/15  Yes Ghimire, Werner LeanShanker M, MD  losartan (COZAAR) 25 MG tablet Take 1 tablet (25 mg total) by mouth daily. 05/16/15  Yes Dhungel, Nishant, MD  simvastatin (ZOCOR) 20 MG tablet Take 1 tablet (20 mg total) by mouth daily at 6 PM. Patient taking differently: Take 20 mg by mouth daily.  05/11/15  Yes Ghimire, Werner LeanShanker M, MD  metaxalone (SKELAXIN) 800 MG tablet Take 1 tablet (800 mg total) by mouth 3 (three) times daily. Patient not taking: Reported on 05/17/2017 04/16/17   Lorre NickAllen, Anthony, MD  nitroGLYCERIN (NITROSTAT) 0.4 MG SL tablet Place 1 tablet (0.4 mg total) under the tongue every 5 (five) minutes as needed for chest pain (for 3 doses). 05/11/15   Maretta BeesGhimire, Shanker M, MD    Inpatient Medications: Scheduled Meds: . amLODipine  2.5 mg Oral Daily  . aspirin EC  81 mg Oral Daily  . losartan  25 mg Oral Daily  . metaxalone  800 mg Oral TID   Continuous Infusions:  PRN Meds: acetaminophen, nitroGLYCERIN, ondansetron (ZOFRAN) IV  Allergies:    Allergies  Allergen Reactions  . Propoxyphene N-Acetaminophen Nausea Only    REACTION: GI upset    Social History:   Social History   Socioeconomic History  . Marital status: Single  Spouse name: Not on file  . Number of children: Not on file  . Years of education: Not on file  . Highest education level: Not on file  Social Needs  . Financial resource strain: Not on file  . Food insecurity - worry: Not on file  . Food insecurity - inability: Not on file  . Transportation needs - medical: Not on file  . Transportation needs - non-medical: Not on file  Occupational History  . Not on file  Tobacco Use  . Smoking status: Never Smoker  . Smokeless tobacco: Never Used  Substance and Sexual Activity  . Alcohol use: Yes    Comment: 1-2 40oz beers  3-4 times a week  . Drug use: No    Comment: cocaine last use 03/29/17  . Sexual activity: Not on file  Other Topics Concern  . Not on file  Social History Narrative   ** Merged History Encounter **        Family History:    Family History  Problem Relation Age of Onset  . Hypertension Father   . Heart disease Father   . Heart disease Mother   . Heart disease Maternal Grandfather      ROS:  Please see the history of present illness.  ROS  All other ROS reviewed and negative.     Physical Exam/Data:   Vitals:   05/17/17 2100 05/17/17 2158 05/18/17 0418 05/18/17 1040  BP: 130/90 (!) 146/86 115/70 (!) 142/93  Pulse: 66 81 (!) 59 66  Resp: (!) 21 20 20    Temp:  98.1 F (36.7 C) (!) 97.5 F (36.4 C) 98.2 F (36.8 C)  TempSrc:  Oral Axillary Oral  SpO2: 98% 99% 98% 97%  Weight:  152 lb 16 oz (69.4 kg)    Height:        Intake/Output Summary (Last 24 hours) at 05/18/2017 1603 Last data filed at 05/18/2017 1013 Gross per 24 hour  Intake -  Output 300 ml  Net -300 ml   Filed Weights   05/17/17 1009 05/17/17 2158  Weight: 160 lb (72.6 kg) 152 lb 16 oz (69.4 kg)   Body mass index is 21.95 kg/m.  General:  Thin male, in no acute distress HEENT: normal Lymph: no adenopathy Neck: no JVD Endocrine:  No thryomegaly Vascular: No carotid bruits; FA pulses 2+ bilaterally without bruits  Cardiac:  normal S1, S2; RRR; no murmur  Lungs:  clear to auscultation bilaterally, no wheezing, rhonchi or rales  Abd: soft, nontender, no hepatomegaly  Ext: no edema Musculoskeletal:  No deformities, BUE and BLE strength normal and equal Skin: warm and dry  Neuro:  CNs 2-12 intact, no focal abnormalities noted Psych:  Normal affect   EKG:  The EKG was personally reviewed and demonstrates:  Sinus rhythm 70 bpm with Minimal ST elevation, inferior leads Telemetry:  Telemetry was personally reviewed and demonstrates:  Sinus rhythm 60's-70s  Relevant CV Studies:  Echo  pending _______________________________________  Left Heart Cath and Coronary Angiography  05/10/2015  Conclusion   Mid RCA lesion, 40% stenosed.  Dist LAD lesion, 30% stenosed.  The left ventricular systolic function is normal.   Assessment: 1. Mild non-obstructive CAD 2. Normal LV function  Plan/discussion: VF arrest likely cocaine-induced vasospasm. Stressed need for absolute cessation of cocaine use. With mild CAD would treat with ASA and statin. Observe overnight and home in am if stable.     Laboratory Data:  Chemistry Recent Labs  Lab 05/17/17 1025  NA  137  K 4.5  CL 104  CO2 25  GLUCOSE 118*  BUN 16  CREATININE 1.11  CALCIUM 8.6*  GFRNONAA >60  GFRAA >60  ANIONGAP 8    No results for input(s): PROT, ALBUMIN, AST, ALT, ALKPHOS, BILITOT in the last 168 hours. Hematology Recent Labs  Lab 05/17/17 1025  WBC 8.7  RBC 5.40  HGB 17.0  HCT 49.7  MCV 92.0  MCH 31.5  MCHC 34.2  RDW 13.6  PLT 287   Cardiac Enzymes Recent Labs  Lab 05/17/17 1145 05/17/17 1842 05/17/17 2114 05/18/17 0007  TROPONINI <0.03 0.07* 0.05* 0.03*    Recent Labs  Lab 05/17/17 0905  TROPIPOC 0.00    BNPNo results for input(s): BNP, PROBNP in the last 168 hours.  DDimer No results for input(s): DDIMER in the last 168 hours.  Radiology/Studies:  Dg Chest 2 View  Result Date: 05/17/2017 CLINICAL DATA:  Upper chest pain began earlier today. EXAM: CHEST  2 VIEW COMPARISON:  04/16/2017. FINDINGS: The heart size and mediastinal contours are within normal limits. Both lungs are clear. The visualized skeletal structures are unremarkable. IMPRESSION: No active cardiopulmonary disease. Stable appearance compared with priors. Electronically Signed   By: Elsie Stain M.D.   On: 05/17/2017 12:21    Assessment and Plan:   Chest pain -Previous hx of non-obstructive CAD by cath in 04/2015. Hx of torsades with Vfib arrest in setting of cocaine use and vasospasm in 04/2015. -Mildly  elevated troponins  0.00, <0.03, 0.07, 0.05, 0.03  (was 0.05 in 03/2017) -EKG without acute ischemic changes -Will increase antispasm med-amlodipine and add nitrate -Ok for discharge from cardiology standpoint. Will arrange follow up in our office  Hypertension -On amlodipine 2.5 mg daily, losartan 25 mg daily -BP moderately elevated  -As above, will increase amlodipine and add nitrate  Hyperlipidemia -On simvastatin 20 mg daily. LDL 74 in 02/2017  Cocaine use -Discussed dangers of cocaine use with risk of death especially in light of his previous cardiac arrest related to vasospasm. He verbalizes understanding.   For questions or updates, please contact [facility] Please consult [email or web address] for contact info under Cardiology/STEMI.   Signed, Berton Bon, NP  05/18/2017 4:03 PM

## 2017-05-18 NOTE — Progress Notes (Addendum)
[facility]  PROGRESS NOTE  Bryce Ortega:[id number] DOB: May 25, 1961 DOA: 05/17/2017 PCP: Lavinia Sharps, NP   Brief HPI:    56 y.o. male with medical history significant of cocaine abuse, nonobstructive CAD, HTN comes in with sscp today that lasted less than 20 minutes that did not radiate and resolved spontaneously prior to arrival to ED.  He has ngt sl to take but ran out of this recently.  He did some cocaine about 4 days ago.  He denies routine use of cocaine.  Pt has h/o vtach arrest in the past after cocaine use due to coronary spasm and had cath in 2016 which did not require any intervention.  No fevers.  No sob.  No swelling in his legs.     Subjective   Patient seen and examined, denies chest pain at this time.   Assessment/Plan:     1. Chest pain- resolved,  trop is trending down.  Echo done today, result is pending. Will consult cardiology for further recommendations. 2. Hypertension- bp stable, continue Cozaar, amlodipine. 3. Cocaine abuse- counseled. Will get social work consult.    DVT prophylaxis: SCD  Code Status: Full code  Family Communication: No family at bedside  Disposition Plan: home in next 1-2 days   Consultants:  None   Procedures:  None   Continuous infusions     Antibiotics:   Anti-infectives (From admission, onward)   None       Objective   Vitals:   05/17/17 2100 05/17/17 2158 05/18/17 0418 05/18/17 1040  BP: 130/90 (!) 146/86 115/70 (!) 142/93  Pulse: 66 81 (!) 59 66  Resp: (!) 21 20 20    Temp:  98.1 F (36.7 C) (!) 97.5 F (36.4 C) 98.2 F (36.8 C)  TempSrc:  Oral Axillary Oral  SpO2: 98% 99% 98% 97%  Weight:  69.4 kg (152 lb 16 oz)    Height:        Intake/Output Summary (Last 24 hours) at 05/18/2017 1606 Last data filed at 05/18/2017 1013 Gross per 24 hour  Intake -  Output 300 ml  Net -300 ml   Filed Weights   05/17/17 1009 05/17/17 2158  Weight: 72.6 kg (160 lb) 69.4 kg (152 lb 16 oz)      Physical Examination:   Physical Exam: Eyes: No icterus, extraocular muscles intact  Mouth: Oral mucosa is moist, no lesions on palate,  Neck: Supple, no deformities, masses, or tenderness Lungs: Normal respiratory effort, bilateral clear to auscultation, no crackles or wheezes.  Heart: Regular rate and rhythm, S1 and S2 normal, no murmurs, rubs auscultated Abdomen: BS normoactive,soft,nondistended,non-tender to palpation,no organomegaly Extremities: No pretibial edema, no erythema, no cyanosis, no clubbing Neuro : Alert and oriented to time, place and person, No focal deficits  Skin: No rashes seen on exam     Data Reviewed: I have personally reviewed following labs and imaging studies  CBG: No results for input(s): GLUCAP in the last 168 hours.  CBC: Recent Labs  Lab 05/17/17 1025  WBC 8.7  HGB 17.0  HCT 49.7  MCV 92.0  PLT 287    Basic Metabolic Panel: Recent Labs  Lab 05/17/17 1025  NA 137  K 4.5  CL 104  CO2 25  GLUCOSE 118*  BUN 16  CREATININE 1.11  CALCIUM 8.6*    Recent Results (from the past 240 hour(s))  MRSA PCR Screening     Status: None   Collection Time: 05/17/17  9:49 PM  Result  Value Ref Range Status   MRSA by PCR NEGATIVE NEGATIVE Final    Comment:        The GeneXpert MRSA Assay (FDA approved for NASAL specimens only), is one component of a comprehensive MRSA colonization surveillance program. It is not intended to diagnose MRSA infection nor to guide or monitor treatment for MRSA infections.      Liver Function Tests: No results for input(s): AST, ALT, ALKPHOS, BILITOT, PROT, ALBUMIN in the last 168 hours. No results for input(s): LIPASE, AMYLASE in the last 168 hours. No results for input(s): AMMONIA in the last 168 hours.  Cardiac Enzymes: Recent Labs  Lab 05/17/17 1145 05/17/17 1842 05/17/17 2114 05/18/17 0007  TROPONINI <0.03 0.07* 0.05* 0.03*   BNP (last 3 results) No results for input(s): BNP in the last  8760 hours.  ProBNP (last 3 results) No results for input(s): PROBNP in the last 8760 hours.    Studies: Dg Chest 2 View  Result Date: 05/17/2017 CLINICAL DATA:  Upper chest pain began earlier today. EXAM: CHEST  2 VIEW COMPARISON:  04/16/2017. FINDINGS: The heart size and mediastinal contours are within normal limits. Both lungs are clear. The visualized skeletal structures are unremarkable. IMPRESSION: No active cardiopulmonary disease. Stable appearance compared with priors. Electronically Signed   By: Elsie StainJohn T Curnes M.D.   On: 05/17/2017 12:21    Scheduled Meds: . amLODipine  2.5 mg Oral Daily  . aspirin EC  81 mg Oral Daily  . losartan  25 mg Oral Daily  . metaxalone  800 mg Oral TID      Time spent: 25 min  Meredeth IdeGagan S Heaven Wandell   [facility] Pager [phone]. If 7PM-7AM, please contact night-coverage at [email or web address], Office  [phone]  password [email or web address]  05/18/2017, 4:06 PM  LOS: 0 days

## 2017-05-19 DIAGNOSIS — R748 Abnormal levels of other serum enzymes: Secondary | ICD-10-CM

## 2017-05-19 MED ORDER — AMLODIPINE BESYLATE 5 MG PO TABS: 5.0000 mg | ORAL_TABLET | Freq: Every day | ORAL | 3 refills | Status: DC

## 2017-05-19 MED ORDER — ISOSORBIDE MONONITRATE ER 30 MG PO TB24: 30.0000 mg | ORAL_TABLET | Freq: Every day | ORAL | 2 refills | Status: DC

## 2017-05-19 NOTE — Discharge Summary (Signed)
Physician Discharge Summary  Bryce Ortega BMW:[id number] DOB: September 09, 1960 DOA: 05/17/2017  PCP: Lavinia Sharps, NP  Admit date: 05/17/2017 Discharge date: 05/19/2017  Time spent: 35* minutes  Recommendations for Outpatient Follow-up:  1. Follow up Cardiology in 2 weeks   Discharge Diagnoses:  Principal Problem:   Chest pain Active Problems:   Essential hypertension   Hyperlipidemia   Cocaine abuse (HCC)   Mild CAD   Elevated troponin   Discharge Condition: Stable  Diet recommendation: Heart healthy diet  Filed Weights   05/17/17 1009 05/17/17 2158  Weight: 72.6 kg (160 lb) 69.4 kg (152 lb 16 oz)    History of present illness:  56 y.o.malewith medical history significant ofcocaine abuse, nonobstructive CAD, HTN comes in with sscp today that lasted less than 20 minutes that did not radiate and resolved spontaneously prior to arrival to ED. He has ngt sl to take but ran out of this recently. He did some cocaine about 4 days ago. He denies routine use of cocaine. Pt has h/o vtach arrest in the past after cocaine use due to coronary spasm and had cath in 2016 which did not require any intervention. No fevers. No sob. No swelling in his legs.    Hospital Course:  1. Chest pain- resolved,  trop is trended down.  Echo done today showed mild LVH, EF 50- 55% , cardiology consulted. Optimized medical therapy. Will follow up as outpatient. 2. Hypertension- bp stable, continue Cozaar, amlodipine. Dose of amlodipine changed to 5 mg po daily. Imdur 30 mg ordered. 3. Cocaine abuse- counseled.      Procedures:  None   Consultations:  Cardiology   Discharge Exam: Vitals:   05/18/17 2004 05/19/17 0436  BP: 120/72 119/77  Pulse: 77 69  Resp: 18 18  Temp: 99.1 F (37.3 C) 98 F (36.7 C)  SpO2: 98% 98%    General: Appears in no acute distress Cardiovascular: S1s2 RRR Respiratory: Clear bilaterally  Discharge Instructions   Discharge Instructions    Diet  - low sodium heart healthy   Complete by:  As directed    Increase activity slowly   Complete by:  As directed      Allergies as of 05/19/2017      Reactions   Propoxyphene N-acetaminophen Nausea Only   REACTION: GI upset      Medication List    TAKE these medications   amLODipine 5 MG tablet Commonly known as:  NORVASC Take 1 tablet (5 mg total) by mouth daily. Start taking on:  05/20/2017 What changed:    medication strength  how much to take   aspirin EC 81 MG tablet Take 1 tablet (81 mg total) by mouth daily.   isosorbide mononitrate 30 MG 24 hr tablet Commonly known as:  IMDUR Take 1 tablet (30 mg total) by mouth daily. Start taking on:  05/20/2017   losartan 25 MG tablet Commonly known as:  COZAAR Take 1 tablet (25 mg total) by mouth daily.   metaxalone 800 MG tablet Commonly known as:  SKELAXIN Take 1 tablet (800 mg total) by mouth 3 (three) times daily.   nitroGLYCERIN 0.4 MG SL tablet Commonly known as:  NITROSTAT Place 1 tablet (0.4 mg total) under the tongue every 5 (five) minutes as needed for chest pain (for 3 doses).   simvastatin 20 MG tablet Commonly known as:  ZOCOR Take 1 tablet (20 mg total) by mouth daily at 6 PM. What changed:  when to take this  Allergies  Allergen Reactions  . Propoxyphene N-Acetaminophen Nausea Only    REACTION: GI upset   Follow-up Information    Azalee CourseMeng, Hao, PA Follow up.   Specialties:  Cardiology, Radiology Why:  On December 27th at 9:00 for cardiology hospital follow up.  Contact information: [address] [place] [address] [phone]            The results of significant diagnostics from this hospitalization (including imaging, microbiology, ancillary and laboratory) are listed below for reference.    Significant Diagnostic Studies: Dg Chest 2 View  Result Date: 05/17/2017 CLINICAL DATA:  Upper chest pain began earlier today. EXAM: CHEST  2 VIEW COMPARISON:  04/16/2017.  FINDINGS: The heart size and mediastinal contours are within normal limits. Both lungs are clear. The visualized skeletal structures are unremarkable. IMPRESSION: No active cardiopulmonary disease. Stable appearance compared with priors. Electronically Signed   By: Elsie StainJohn T Curnes M.D.   On: 05/17/2017 12:21    Microbiology: Recent Results (from the past 240 hour(s))  MRSA PCR Screening     Status: None   Collection Time: 05/17/17  9:49 PM  Result Value Ref Range Status   MRSA by PCR NEGATIVE NEGATIVE Final    Comment:        The GeneXpert MRSA Assay (FDA approved for NASAL specimens only), is one component of a comprehensive MRSA colonization surveillance program. It is not intended to diagnose MRSA infection nor to guide or monitor treatment for MRSA infections.      Labs: Basic Metabolic Panel: Recent Labs  Lab 05/17/17 1025  NA 137  K 4.5  CL 104  CO2 25  GLUCOSE 118*  BUN 16  CREATININE 1.11  CALCIUM 8.6*   Liver Function Tests: No results for input(s): AST, ALT, ALKPHOS, BILITOT, PROT, ALBUMIN in the last 168 hours. No results for input(s): LIPASE, AMYLASE in the last 168 hours. No results for input(s): AMMONIA in the last 168 hours. CBC: Recent Labs  Lab 05/17/17 1025  WBC 8.7  HGB 17.0  HCT 49.7  MCV 92.0  PLT 287   Cardiac Enzymes: Recent Labs  Lab 05/17/17 1145 05/17/17 1842 05/17/17 2114 05/18/17 0007  TROPONINI <0.03 0.07* 0.05* 0.03*        Signed:  Meredeth IdeGagan S Layloni Fahrner MD.  [facility] 05/19/2017, 11:38 AM

## 2017-06-10 ENCOUNTER — Ambulatory Visit: Payer: Self-pay | Admitting: Physician Assistant

## 2017-07-06 ENCOUNTER — Observation Stay (HOSPITAL_COMMUNITY)
Admission: EM | Admit: 2017-07-06 | Discharge: 2017-07-07 | Disposition: A | Discharge status: Home / Self Care | Payer: Self-pay | Attending: Internal Medicine | Admitting: Internal Medicine

## 2017-07-06 ENCOUNTER — Other Ambulatory Visit: Payer: Self-pay

## 2017-07-06 ENCOUNTER — Encounter (HOSPITAL_COMMUNITY): Payer: Self-pay | Admitting: Emergency Medicine

## 2017-07-06 DIAGNOSIS — Z8674 Personal history of sudden cardiac arrest: Secondary | ICD-10-CM | POA: Insufficient documentation

## 2017-07-06 DIAGNOSIS — Z9889 Other specified postprocedural states: Secondary | ICD-10-CM | POA: Insufficient documentation

## 2017-07-06 DIAGNOSIS — Z888 Allergy status to other drugs, medicaments and biological substances status: Secondary | ICD-10-CM | POA: Insufficient documentation

## 2017-07-06 DIAGNOSIS — T405X5A Adverse effect of cocaine, initial encounter: Secondary | ICD-10-CM | POA: Diagnosis present

## 2017-07-06 DIAGNOSIS — I10 Essential (primary) hypertension: Secondary | ICD-10-CM | POA: Insufficient documentation

## 2017-07-06 DIAGNOSIS — R7989 Other specified abnormal findings of blood chemistry: Secondary | ICD-10-CM | POA: Diagnosis present

## 2017-07-06 DIAGNOSIS — I340 Nonrheumatic mitral (valve) insufficiency: Secondary | ICD-10-CM | POA: Insufficient documentation

## 2017-07-06 DIAGNOSIS — E785 Hyperlipidemia, unspecified: Secondary | ICD-10-CM | POA: Insufficient documentation

## 2017-07-06 DIAGNOSIS — N179 Acute kidney failure, unspecified: Secondary | ICD-10-CM | POA: Diagnosis present

## 2017-07-06 DIAGNOSIS — Z9119 Patient's noncompliance with other medical treatment and regimen: Secondary | ICD-10-CM | POA: Insufficient documentation

## 2017-07-06 DIAGNOSIS — Z79899 Other long term (current) drug therapy: Secondary | ICD-10-CM | POA: Insufficient documentation

## 2017-07-06 DIAGNOSIS — Z9114 Patient's other noncompliance with medication regimen: Secondary | ICD-10-CM | POA: Insufficient documentation

## 2017-07-06 DIAGNOSIS — Z7982 Long term (current) use of aspirin: Secondary | ICD-10-CM | POA: Insufficient documentation

## 2017-07-06 DIAGNOSIS — I249 Acute ischemic heart disease, unspecified: Secondary | ICD-10-CM | POA: Diagnosis present

## 2017-07-06 DIAGNOSIS — Z8249 Family history of ischemic heart disease and other diseases of the circulatory system: Secondary | ICD-10-CM | POA: Insufficient documentation

## 2017-07-06 DIAGNOSIS — R748 Abnormal levels of other serum enzymes: Secondary | ICD-10-CM | POA: Insufficient documentation

## 2017-07-06 DIAGNOSIS — I209 Angina pectoris, unspecified: Secondary | ICD-10-CM

## 2017-07-06 DIAGNOSIS — R778 Other specified abnormalities of plasma proteins: Secondary | ICD-10-CM | POA: Diagnosis present

## 2017-07-06 DIAGNOSIS — F1410 Cocaine abuse, uncomplicated: Secondary | ICD-10-CM | POA: Diagnosis present

## 2017-07-06 DIAGNOSIS — I214 Non-ST elevation (NSTEMI) myocardial infarction: Principal | ICD-10-CM | POA: Diagnosis present

## 2017-07-06 DIAGNOSIS — R079 Chest pain, unspecified: Secondary | ICD-10-CM

## 2017-07-06 DIAGNOSIS — E7800 Pure hypercholesterolemia, unspecified: Secondary | ICD-10-CM | POA: Insufficient documentation

## 2017-07-06 DIAGNOSIS — I2510 Atherosclerotic heart disease of native coronary artery without angina pectoris: Secondary | ICD-10-CM | POA: Insufficient documentation

## 2017-07-06 HISTORY — DX: Acute myocardial infarction, unspecified: I21.9

## 2017-07-06 HISTORY — DX: Personal history of other diseases of the circulatory system: Z86.79

## 2017-07-06 HISTORY — DX: Atherosclerotic heart disease of native coronary artery without angina pectoris: I25.10

## 2017-07-06 LAB — CBC WITH DIFFERENTIAL/PLATELET
BASOS PCT: 1 %
Basophils Absolute: 0 10*3/uL (ref 0.0–0.1)
EOS ABS: 0.3 10*3/uL (ref 0.0–0.7)
EOS PCT: 4 %
HCT: 44.3 % (ref 39.0–52.0)
HEMOGLOBIN: 14.9 g/dL (ref 13.0–17.0)
LYMPHS ABS: 2.2 10*3/uL (ref 0.7–4.0)
Lymphocytes Relative: 26 %
MCH: 31.2 pg (ref 26.0–34.0)
MCHC: 33.6 g/dL (ref 30.0–36.0)
MCV: 92.9 fL (ref 78.0–100.0)
MONO ABS: 0.8 10*3/uL (ref 0.1–1.0)
MONOS PCT: 9 %
Neutro Abs: 5 10*3/uL (ref 1.7–7.7)
Neutrophils Relative %: 60 %
PLATELETS: 266 10*3/uL (ref 150–400)
RBC: 4.77 MIL/uL (ref 4.22–5.81)
RDW: 13.3 % (ref 11.5–15.5)
WBC: 8.4 10*3/uL (ref 4.0–10.5)

## 2017-07-06 LAB — COMPREHENSIVE METABOLIC PANEL
ALT: 20 U/L (ref 17–63)
AST: 21 U/L (ref 15–41)
Albumin: 2.6 g/dL — ABNORMAL LOW (ref 3.5–5.0)
Alkaline Phosphatase: 41 U/L (ref 38–126)
Anion gap: 7 (ref 5–15)
BUN: 19 mg/dL (ref 6–20)
CHLORIDE: 107 mmol/L (ref 101–111)
CO2: 23 mmol/L (ref 22–32)
CREATININE: 1.09 mg/dL (ref 0.61–1.24)
Calcium: 8.1 mg/dL — ABNORMAL LOW (ref 8.9–10.3)
GFR calc non Af Amer: >60 mL/min (ref >60)
GLUCOSE: 120 mg/dL — AB (ref 65–99)
Potassium: 3.9 mmol/L (ref 3.5–5.1)
SODIUM: 137 mmol/L (ref 135–145)
Total Bilirubin: 0.6 mg/dL (ref 0.3–1.2)
Total Protein: 4.3 g/dL — ABNORMAL LOW (ref 6.5–8.1)

## 2017-07-06 LAB — I-STAT TROPONIN, ED
TROPONIN I, POC: 0.01 ng/mL (ref 0.00–0.08)
TROPONIN I, POC: 0.07 ng/mL (ref 0.00–0.08)
TROPONIN I, POC: 0.1 ng/mL — AB (ref 0.00–0.08)
Troponin i, poc: 0.09 ng/mL (ref 0.00–0.08)

## 2017-07-06 LAB — RAPID URINE DRUG SCREEN, HOSP PERFORMED
Amphetamines: NOT DETECTED
BARBITURATES: NOT DETECTED
BENZODIAZEPINES: NOT DETECTED
Cocaine: POSITIVE — AB
Opiates: NOT DETECTED
Tetrahydrocannabinol: NOT DETECTED

## 2017-07-06 LAB — MRSA PCR SCREENING: MRSA by PCR: NEGATIVE

## 2017-07-06 LAB — HEPARIN LEVEL (UNFRACTIONATED)
HEPARIN UNFRACTIONATED: 0.12 [IU]/mL — AB (ref 0.30–0.70)
Heparin Unfractionated: 0.15 IU/mL — ABNORMAL LOW (ref 0.30–0.70)

## 2017-07-06 LAB — TROPONIN I: Troponin I: <0.03 ng/mL (ref <0.03)

## 2017-07-06 MED ORDER — HEPARIN (PORCINE) IN NACL 100-0.45 UNIT/ML-% IJ SOLN
850.0000 [IU]/h | INTRAMUSCULAR | Status: DC
  Filled 2017-07-06: qty 250

## 2017-07-06 MED ORDER — ISOSORBIDE MONONITRATE ER 30 MG PO TB24
30.0000 mg | ORAL_TABLET | Freq: Every day | ORAL | Status: DC
  Administered 2017-07-06 – 2017-07-07 (×2): 30 mg via ORAL
  Filled 2017-07-06 (×2): qty 1

## 2017-07-06 MED ORDER — HEPARIN (PORCINE) IN NACL 100-0.45 UNIT/ML-% IJ SOLN
1200.0000 [IU]/h | INTRAMUSCULAR | Status: DC
  Administered 2017-07-07: 1200 [IU]/h via INTRAVENOUS
  Filled 2017-07-06: qty 250

## 2017-07-06 MED ORDER — NITROGLYCERIN 0.4 MG SL SUBL: 0.4000 mg | SUBLINGUAL_TABLET | SUBLINGUAL | Status: DC | PRN

## 2017-07-06 MED ORDER — AMLODIPINE BESYLATE 5 MG PO TABS
5.0000 mg | ORAL_TABLET | Freq: Every day | ORAL | Status: DC
  Administered 2017-07-06 – 2017-07-07 (×2): 5 mg via ORAL
  Filled 2017-07-06 (×2): qty 1

## 2017-07-06 MED ORDER — LOSARTAN POTASSIUM 50 MG PO TABS
25.0000 mg | ORAL_TABLET | Freq: Every day | ORAL | Status: DC
  Administered 2017-07-06: 25 mg via ORAL
  Filled 2017-07-06: qty 1

## 2017-07-06 MED ORDER — HEPARIN SODIUM (PORCINE) 5000 UNIT/ML IJ SOLN
4000.0000 [IU] | Freq: Once | INTRAMUSCULAR | Status: AC
  Administered 2017-07-06: 4000 [IU] via INTRAVENOUS

## 2017-07-06 MED ORDER — ASPIRIN 81 MG PO CHEW: 324.0000 mg | CHEWABLE_TABLET | ORAL | Status: DC

## 2017-07-06 MED ORDER — ASPIRIN EC 81 MG PO TBEC: 81.0000 mg | DELAYED_RELEASE_TABLET | Freq: Every day | ORAL | Status: DC

## 2017-07-06 MED ORDER — SODIUM CHLORIDE 0.9% FLUSH: 3.0000 mL | INTRAVENOUS | Status: DC | PRN

## 2017-07-06 MED ORDER — SODIUM CHLORIDE 0.9 % IV SOLN: 250.0000 mL | INTRAVENOUS | Status: DC | PRN

## 2017-07-06 MED ORDER — ONDANSETRON HCL 4 MG/2ML IJ SOLN: 4.0000 mg | Freq: Four times a day (QID) | INTRAMUSCULAR | Status: DC | PRN

## 2017-07-06 MED ORDER — SIMVASTATIN 20 MG PO TABS
20.0000 mg | ORAL_TABLET | Freq: Every day | ORAL | Status: DC
  Administered 2017-07-06: 20 mg via ORAL
  Filled 2017-07-06 (×2): qty 1

## 2017-07-06 MED ORDER — HEPARIN (PORCINE) IN NACL 100-0.45 UNIT/ML-% IJ SOLN
850.0000 [IU]/h | Freq: Once | INTRAMUSCULAR | Status: AC
  Administered 2017-07-06: 850 [IU]/h via INTRAVENOUS
  Filled 2017-07-06: qty 250

## 2017-07-06 MED ORDER — NITROGLYCERIN 2 % TD OINT
1.0000 [in_us] | TOPICAL_OINTMENT | Freq: Once | TRANSDERMAL | Status: AC
  Administered 2017-07-06: 1 [in_us] via TOPICAL
  Filled 2017-07-06: qty 1

## 2017-07-06 MED ORDER — ASPIRIN EC 81 MG PO TBEC
81.0000 mg | DELAYED_RELEASE_TABLET | Freq: Every day | ORAL | Status: DC
  Administered 2017-07-07: 81 mg via ORAL
  Filled 2017-07-06: qty 1

## 2017-07-06 MED ORDER — SODIUM CHLORIDE 0.9% FLUSH: 3.0000 mL | Freq: Two times a day (BID) | INTRAVENOUS | Status: DC

## 2017-07-06 MED ORDER — ASPIRIN 300 MG RE SUPP: 300.0000 mg | RECTAL | Status: DC

## 2017-07-06 NOTE — ED Notes (Signed)
EDP notified on pt.'s elevated Troponin result. 

## 2017-07-06 NOTE — Progress Notes (Signed)
ANTICOAGULATION CONSULT NOTE - Initial Consult  Pharmacy Consult for Heparin  Indication: chest pain/ACS  Allergies  Allergen Reactions  . Propoxyphene N-Acetaminophen Nausea Only    REACTION: GI upset    Patient Measurements: Height: 5\' 11"  (180.3 cm) Weight: 160 lb (72.6 kg) IBW/kg (Calculated) : 75.3  Vital Signs: Temp: 98.2 F (36.8 C) (01/22 1549) Temp Source: Oral (01/22 1549) BP: 121/70 (01/22 1549) Pulse Rate: 57 (01/22 1400)  Labs: Recent Labs    07/06/17 0257 07/06/17 1544  HGB 14.9  --   HCT 44.3  --   PLT 266  --   HEPARINUNFRC  --  0.12*  CREATININE 1.09  --     Estimated Creatinine Clearance: 77.7 mL/min (by C-G formula based on SCr of 1.09 mg/dL).   Medical History: Past Medical History:  Diagnosis Date  . Cocaine abuse (HCC)   . H/O ventricular fibrillation 2016   s/p cocaine use  . High cholesterol   . Hypertension   . MI (mitral incompetence)   . Nonobstructive atherosclerosis of coronary artery 2016   LHC showed normal EF and mild non-obstructive CAD    Medications:  No current facility-administered medications on file prior to encounter.    Current Outpatient Medications on File Prior to Encounter  Medication Sig Dispense Refill  . amLODipine (NORVASC) 5 MG tablet Take 1 tablet (5 mg total) by mouth daily. 30 tablet 3  . aspirin EC 81 MG tablet Take 1 tablet (81 mg total) by mouth daily. 30 tablet 0  . isosorbide mononitrate (IMDUR) 30 MG 24 hr tablet Take 1 tablet (30 mg total) by mouth daily. 30 tablet 2  . losartan (COZAAR) 25 MG tablet Take 1 tablet (25 mg total) by mouth daily. 30 tablet 0  . nitroGLYCERIN (NITROSTAT) 0.4 MG SL tablet Place 1 tablet (0.4 mg total) under the tongue every 5 (five) minutes as needed for chest pain (for 3 doses). 30 tablet 0  . simvastatin (ZOCOR) 20 MG tablet Take 1 tablet (20 mg total) by mouth daily at 6 PM. (Patient taking differently: Take 20 mg by mouth daily. ) 30 tablet 0     Assessment: 57  y.o. male with chest pain and elevated troponins for heparin.  Initial heparin level below goal at 0.12. Per RN no known issues with IV infusion.  No overt bleeding or complications noted.  Goal of Therapy:  Heparin level 0.3-0.7 units/ml Monitor platelets by anticoagulation protocol: Yes   Plan:  Increase IV heparin to 1000 units/hr. Check heparin level in 6 hours. Daily heparin level and CBC.   Tad Moore, BCPS  Clinical Pharmacist Pager [phone]  07/06/2017 5:20 PM

## 2017-07-06 NOTE — ED Notes (Signed)
Spoke with Diplomatic Services operational officer to order a heart health diet for patient.

## 2017-07-06 NOTE — ED Notes (Signed)
Spoke with Admitting Doctor and notified of istat troponin 0.10. Will continue to monitor at this time.

## 2017-07-06 NOTE — H&P (Addendum)
History and Physical:    Bryce Ortega   TXH:[id number] DOB: 12/11/1960 DOA: 07/06/2017  Referring MD/provider: Dr Jarold Song  PCP: Hilbert Corrigan [name], NP   Patient coming from: Home  Chief Complaint: "I will cope with chest pain".  History of Present Illness:   Bryce Ortega is an 57 y.o. male with past medical history significant for chest pain secondary to cocaine use with previous V. fib arrest who was in his usual state of health until 2 AM this morning when he woke with sharp stabbing chest pains in midsternum. Patient states this is identical to chest pains he has had in the past after cocaine use. He denies any associated symptoms such as shortness of breath palpitations dizziness syncope or nausea. He may have had some diaphoresis. Patient self treated with aspirin without good effect. He called EMS and was given nitroglycerin 3 which finally led to easing off of chest pain. Patient states he thinks entire episode of chest pain lasted about one hour. He is presently chest pain-free.  Patient was admitted last month for identical symptoms and was ruled out and sent home. Echo done at that time showed mild LVH and normal ejection fraction. Patient underwent a cardiac catheterization in 2016 when he had his V. fib arrest which showed nonobstructive coronary artery disease.  Patient admits he is not entirely compliant with his prescription medications. Patient states his last cocaine use was 5 days ago but he doesn't really remember. He denies tobacco use.   ED Course:  The patient was treated with aspirin, oxygen and started on a heparin drip per cardiology PA recommendations. He is chest pain-free.  ROS:   ROS   Review of Systems: General: No fever, chills,  Skin: No rashes HENT: no ear pain, hearing loss,  Respiratory: No cough,, shortness of breath, hemoptysis GI: No nausea, vomiting, diarrhea, constipation GU: No dysuria, increased frequency CNS: No numbness,  dizziness, headache Musculoskeletal: No back pain, joint pain  Past Medical History:   Past Medical History:  Diagnosis Date  . Cocaine abuse (HCC)   . High cholesterol   . Hypertension   . MI (mitral incompetence)     Past Surgical History:   Past Surgical History:  Procedure Laterality Date  . CARDIAC CATHETERIZATION N/A 05/10/2015   Procedure: Left Heart Cath and Coronary Angiography;  Surgeon: Dolores Patty, MD;  Location: [facility] INVASIVE CV LAB;  Service: Cardiovascular;  Laterality: N/A;  . TONSILLECTOMY      Social History:   Social History   Socioeconomic History  . Marital status: Single    Spouse name: Not on file  . Number of children: Not on file  . Years of education: Not on file  . Highest education level: Not on file  Social Needs  . Financial resource strain: Not on file  . Food insecurity - worry: Not on file  . Food insecurity - inability: Not on file  . Transportation needs - medical: Not on file  . Transportation needs - non-medical: Not on file  Occupational History  . Not on file  Tobacco Use  . Smoking status: Never Smoker  . Smokeless tobacco: Never Used  Substance and Sexual Activity  . Alcohol use: Yes  . Drug use: Yes    Types: Cocaine  . Sexual activity: Not on file  Other Topics Concern  . Not on file  Social History Narrative   ** Merged History Encounter **  Allergies   Propoxyphene n-acetaminophen  Family history:   Family History  Problem Relation Age of Onset  . Hypertension Father   . Heart disease Father   . Heart disease Mother   . Heart disease Maternal Grandfather     Current Medications:   Prior to Admission medications   Medication Sig Start Date End Date Taking? Authorizing Provider  amLODipine (NORVASC) 5 MG tablet Take 1 tablet (5 mg total) by mouth daily. 05/20/17  Yes Meredeth Ide, MD  aspirin EC 81 MG tablet Take 1 tablet (81 mg total) by mouth daily. 05/11/15  Yes Ghimire, Werner Lean, MD    isosorbide mononitrate (IMDUR) 30 MG 24 hr tablet Take 1 tablet (30 mg total) by mouth daily. 05/20/17  Yes Meredeth Ide, MD  losartan (COZAAR) 25 MG tablet Take 1 tablet (25 mg total) by mouth daily. 05/16/15  Yes Dhungel, Nishant, MD  nitroGLYCERIN (NITROSTAT) 0.4 MG SL tablet Place 1 tablet (0.4 mg total) under the tongue every 5 (five) minutes as needed for chest pain (for 3 doses). 05/11/15  Yes Ghimire, Werner Lean, MD  simvastatin (ZOCOR) 20 MG tablet Take 1 tablet (20 mg total) by mouth daily at 6 PM. Patient taking differently: Take 20 mg by mouth daily.  05/11/15  Yes Maretta Bees, MD    Physical Exam:   Vitals:   07/06/17 0545 07/06/17 0600 07/06/17 0615 07/06/17 0700  BP: (!) 142/95 118/72 118/78 126/81  Pulse: 60 (!) 59 (!) 59 (!) 55  Resp: 15 15 13 12   Temp:      TempSrc:      SpO2: 95% 95% 96% 98%  Weight:      Height:         Physical Exam: Blood pressure 126/81, pulse (!) 55, temperature 98.3 F (36.8 C), temperature source Oral, resp. rate 12, height 5\' 11"  (1.803 m), weight 72.6 kg (160 lb), SpO2 98 %. Gen: Disheveled-appearing man lying in bed sleeping in NAD Eyes: Sclerae anicteric. Conjunctiva mildly injected. Neck: Supple, no jugular venous distention. Chest: Moderately good air entry bilaterally with no adventitious sounds.  CV: Rregular, no audible murmurs. Abdomen: NABS, soft, nondistended, nontender. No tenderness to light or deep palpation. Extremities: No edema.  Skin: Warm and dry. No rashes, lesions or wounds. Neuro: Alert and oriented times 3; grossly nonfocal. Psych: Patient is cooperative, logical and coherent with appropriate mood and affect.  Data Review:    Labs: Basic Metabolic Panel: Recent Labs  Lab 07/06/17 0257  NA 137  K 3.9  CL 107  CO2 23  GLUCOSE 120*  BUN 19  CREATININE 1.09  CALCIUM 8.1*   Liver Function Tests: Recent Labs  Lab 07/06/17 0257  AST 21  ALT 20  ALKPHOS 41  BILITOT 0.6  PROT 4.3*  ALBUMIN  2.6*   No results for input(s): LIPASE, AMYLASE in the last 168 hours. No results for input(s): AMMONIA in the last 168 hours. CBC: Recent Labs  Lab 07/06/17 0257  WBC 8.4  NEUTROABS 5.0  HGB 14.9  HCT 44.3  MCV 92.9  PLT 266   Cardiac Enzymes: No results for input(s): CKTOTAL, CKMB, CKMBINDEX, TROPONINI in the last 168 hours.  BNP (last 3 results) No results for input(s): PROBNP in the last 8760 hours. CBG: No results for input(s): GLUCAP in the last 168 hours.  Urinalysis No results found for: COLORURINE, APPEARANCEUR, LABSPEC, PHURINE, GLUCOSEU, HGBUR, BILIRUBINUR, KETONESUR, PROTEINUR, UROBILINOGEN, NITRITE, LEUKOCYTESUR    Radiographic Studies: No results  found.  EKG: Independently reviewed. Normal sinus rhythm at 75, normal intervals, normal axis, no acute ST-T wave changes. Normal EKG.   Assessment/Plan:   Active Problems:   NSTEMI (non-ST elevated myocardial infarction) (HCC)   Cocaine abuse (HCC)   Elevated troponin   Ischemia, myocardial, acute (HCC)  COCAINE INDUCED CHEST PAIN Second episode of cocaine-induced chest pain within the past 30 days. EKG is within normal limits however his troponin is climbing now at 0.09. He is chest pain-free at present, he has received aspirin and is on a heparin drip. Will avoid beta blockers given cocaine use. Cardiology has been consulted for possible cardiac catheterization. Known nonobstructive CAD from 2016 Echo last month w mild lvh and nl ef.  Continue simvastatin and Imdur  HTN On cozaar and amlodipine, will continue Avoid beta blockers   H/O VT ARREST  Patient is on telemetry monitoring   COCAINE USE Patient well aware of risks Not interested in quitting right now.   Other information:   DVT prophylaxis: Lovenox ordered. Code Status: Full code. Family Communication: Patient states there is no family to call Disposition Plan: Home Consults called: Cardiology Admission status: Observation     Pieter Partridge [facility] Pager [phone] Cell: [phone]   If 7PM-7AM, please contact night-coverage [email or web address] Password [facility] 07/06/2017, 7:46 AM

## 2017-07-06 NOTE — ED Notes (Signed)
Doctor at bedside.

## 2017-07-06 NOTE — ED Triage Notes (Addendum)
Patient reports central chest pain with mid SOB and diaphoresis this evening , received ASA 324 mg and 3 NTG sl prior to arrival with slight relief , rates pain 3/10 at arrival . Cocaine use last week.

## 2017-07-06 NOTE — Consult Note (Signed)
The patient has been seen in conjunction with Duwaine Maxin, NP. All aspects of care have been considered and discussed. The patient has been personally interviewed, examined, and all clinical data has been reviewed.   Prolonged chest pain, temporally related to cocaine use, with low level/flat troponin elevation.  Known prior history of nonobstructive coronary disease by coronary angiography.  Anti-ischemic/antispasm regimen including long-acting nitrates were suddenly withdrawn 24-36 hours before the episode of chest pain awakened the patient from sleep.  He ran out of his medications and did not get them refilled.  Nitroglycerin all the way into the emergency room help relieve the discomfort.  Prior history of ventricular fibrillation cardiac arrest with successful resuscitation.  Currently pain-free  Exam is unremarkable.  Peak troponin 0.10.  ECG is normal  Possible recurrent coronary spasm in the setting of cocaine use and noncompliance with medical therapy.  Discussed abstinence from cocaine use.  Discussed the importance of therapy compliance.  Recommend resumption of anti-ischemic therapy.  Cycle cardiac markers.  Repeat EKG in the a.m.  If no evolutionary EKG changes or significant rise in troponin, no further workup is indicated.   Cardiology Consultation:   Patient ID: Bryce Ortega; [id number]; 1960/12/08   Admit date: 07/06/2017 Date of Consult: 07/06/2017  Primary Care Provider: Lavinia Sharps, NP Primary Cardiologist: None Primary Electrophysiologist:  None  Chief Complaint: Chest Pain  Patient Profile:   Bryce Ortega is a 57 y.o. male with a hx of cocaine abuse, nonobstructive CAD, HTN, Vfib arrest s/p cocaine use (2016), and HL who is being seen today for the evaluation of chest pain at the request of Dr. Luberta Robertson.  History of Present Illness:   Bryce Ortega has had several ED visits r/t CP after cocaine use (2 in 2018, 1 in 2017). CP was attributed to  cocaine use. In 2016, he had a LHC after a VF arrest. It showed mild non-obstructive CAD. VF arrest was thought to be from cocaine-induced vasospasm.  He has not followed up outpatient - only ED visits on file. He reports that he had to cancel his cardiology appointment in December because of work and has not yet rescheduled.  Bryce Ortega woke up with midsternal CP with SOB and diaphoresis around 2 am 07/06/2017. It felt similar to CP he has had after cocaine use, but slightly more severe. The pain lasted about an hour. No dizziness, syncope, radiation, nausea, or vomiting. He took 324 mg ASA at home with no relief and called 911. He was given 3 SL nitro by EMS, which relieved his pain. He has not had any more CP.   He states that he is compliant with medications and is able to name everything he takes. He ran out and missed his medications yesterday (including his imdur). [facility] He is active and works in a warehouse and has not ever had any CP with activity. He last did cocaine 2 days ago. He does not smoke. He drinks 2 40's about 2x/week.  Pertinent admission labs include: K 3.9, creatinine 1.09, albumin 2.6, WBC 8.4, troponin 0.01>0.07>0.09>0.10, UDS positive for cocaine.   Past Medical History:  Diagnosis Date  . Cocaine abuse (HCC)   . H/O ventricular fibrillation 2016   s/p cocaine use  . High cholesterol   . Hypertension   . MI (mitral incompetence)   . Nonobstructive atherosclerosis of coronary artery 2016   LHC showed normal EF and mild non-obstructive CAD  Past Surgical History:  Procedure Laterality Date  . CARDIAC CATHETERIZATION N/A 05/10/2015   Procedure: Left Heart Cath and Coronary Angiography;  Surgeon: Dolores Patty, MD;  Location: [facility] INVASIVE CV LAB;  Service: Cardiovascular;  Laterality: N/A;  . TONSILLECTOMY       Inpatient Medications: Scheduled Meds: . amLODipine  5 mg Oral Daily  . aspirin  324 mg Oral  NOW   Or  . aspirin  300 mg Rectal NOW  . [START ON 07/07/2017] aspirin EC  81 mg Oral Daily  . [START ON 07/07/2017] aspirin EC  81 mg Oral Daily  . isosorbide mononitrate  30 mg Oral Daily  . simvastatin  20 mg Oral q1800  . sodium chloride flush  3 mL Intravenous Q12H   Continuous Infusions: . sodium chloride    . heparin     PRN Meds: sodium chloride, nitroGLYCERIN, nitroGLYCERIN, ondansetron (ZOFRAN) IV, sodium chloride flush  Home Meds: Prior to Admission medications   Medication Sig Start Date End Date Taking? Authorizing Provider  amLODipine (NORVASC) 5 MG tablet Take 1 tablet (5 mg total) by mouth daily. 05/20/17  Yes Meredeth Ide, MD  aspirin EC 81 MG tablet Take 1 tablet (81 mg total) by mouth daily. 05/11/15  Yes Ghimire, Werner Lean, MD  isosorbide mononitrate (IMDUR) 30 MG 24 hr tablet Take 1 tablet (30 mg total) by mouth daily. 05/20/17  Yes Meredeth Ide, MD  losartan (COZAAR) 25 MG tablet Take 1 tablet (25 mg total) by mouth daily. 05/16/15  Yes Dhungel, Nishant, MD  nitroGLYCERIN (NITROSTAT) 0.4 MG SL tablet Place 1 tablet (0.4 mg total) under the tongue every 5 (five) minutes as needed for chest pain (for 3 doses). 05/11/15  Yes Ghimire, Werner Lean, MD  simvastatin (ZOCOR) 20 MG tablet Take 1 tablet (20 mg total) by mouth daily at 6 PM. Patient taking differently: Take 20 mg by mouth daily.  05/11/15  Yes Ghimire, Werner Lean, MD    Allergies:    Allergies  Allergen Reactions  . Propoxyphene N-Acetaminophen Nausea Only    REACTION: GI upset    Social History:   Social History   Socioeconomic History  . Marital status: Single    Spouse name: Not on file  . Number of children: Not on file  . Years of education: Not on file  . Highest education level: Not on file  Social Needs  . Financial resource strain: Not on file  . Food insecurity - worry: Not on file  . Food insecurity - inability: Not on file  . Transportation needs - medical: Not on file  .  Transportation needs - non-medical: Not on file  Occupational History  . Not on file  Tobacco Use  . Smoking status: Never Smoker  . Smokeless tobacco: Never Used  Substance and Sexual Activity  . Alcohol use: Yes    Comment: 2 40 oz beers 2x/week  . Drug use: Yes    Types: Cocaine  . Sexual activity: Not on file  Other Topics Concern  . Not on file  Social History Narrative   ** Merged History Encounter **        Family History:   The patient's family history includes Heart disease in his father, maternal grandfather, and mother; Hypertension in his father.  ROS:  Please see the history of present illness.  All other ROS reviewed and negative.     Physical Exam/Data:   Vitals:   07/06/17 1215 07/06/17 1230 07/06/17 1245  07/06/17 1300  BP: 119/87 117/85 112/76 129/90  Pulse: 61 (!) 56 61 71  Resp: 16 14 14 17   Temp:      TempSrc:      SpO2: 96% 96% 97% 99%  Weight:      Height:       No intake or output data in the 24 hours ending 07/06/17 1333 Filed Weights   07/06/17 0256  Weight: 160 lb (72.6 kg)   Body mass index is 22.32 kg/m.  General: Well developed, well nourished, in no acute distress. Head: Normocephalic, atraumatic, sclera non-icteric, no xanthomas, nares are without discharge.  Neck: Negative for carotid bruits. JVD not elevated. Lungs: Clear bilaterally to auscultation without wheezes, rales, or rhonchi. Breathing is unlabored. Heart: RRR with S1 S2. No murmurs, rubs, or gallops appreciated. Abdomen: Soft, non-tender, non-distended with normoactive bowel sounds. No hepatomegaly. No rebound/guarding. No obvious abdominal masses. Msk:  Strength and tone appear normal for age. Extremities: No clubbing or cyanosis. No edema.  Distal pedal pulses are 2+ and equal bilaterally. Neuro: Alert and oriented X 3. No facial asymmetry. No focal deficit. Moves all extremities spontaneously. Psych:  Responds to questions appropriately with a normal affect.  EKG:   The EKG was personally reviewed and demonstrates NSR 66 bpm  Relevant CV Studies:  Echo 05/18/2017 - Left ventricle: The cavity size was normal. Wall thickness was   increased in a pattern of mild LVH. There was focal basal   hypertrophy. Systolic function was normal. The estimated ejection   fraction was in the range of 50% to 55%. Wall motion was normal;   there were no regional wall motion abnormalities. Left   ventricular diastolic function parameters were normal.  LHC 05/10/2015  Mid RCA lesion, 40% stenosed.  Dist LAD lesion, 30% stenosed.  The left ventricular systolic function is normal. 1. Mild non-obstructive CAD 2. Normal LV function  Laboratory Data:  Chemistry Recent Labs  Lab 07/06/17 0257  NA 137  K 3.9  CL 107  CO2 23  GLUCOSE 120*  BUN 19  CREATININE 1.09  CALCIUM 8.1*  GFRNONAA >60  GFRAA >60  ANIONGAP 7    Recent Labs  Lab 07/06/17 0257  PROT 4.3*  ALBUMIN 2.6*  AST 21  ALT 20  ALKPHOS 41  BILITOT 0.6   Hematology Recent Labs  Lab 07/06/17 0257  WBC 8.4  RBC 4.77  HGB 14.9  HCT 44.3  MCV 92.9  MCH 31.2  MCHC 33.6  RDW 13.3  PLT 266   Cardiac EnzymesNo results for input(s): TROPONINI in the last 168 hours.  Recent Labs  Lab 07/06/17 0310 07/06/17 0453 07/06/17 0544 07/06/17 1149  TROPIPOC 0.01 0.07 0.09* 0.10*    BNPNo results for input(s): BNP, PROBNP in the last 168 hours.  DDimer No results for input(s): DDIMER in the last 168 hours.  Radiology/Studies:  No results found.  Assessment and Plan:   1. Chest Pain - No further CP since SL nitro administered by EMS - Likely related to cocaine use 2 days ago and missing imdur dose yesterday - Troponin's mildly elevated, but rising (0.01 > 0.07 > 0.09 > 0.10). Continue to cycle. - Mild CAD on LHC 04/2015 - Echo 05/2017 EF 50-55% without wall motion abnormality. Will not repeat right now since CP similar to CP last month. - Continue statin, asa, and imdur - Avoid beta  blockers with active cocaine use - Agree with observation and heparin drip overnight. Monitor and control pain. -  If he has more chest pain or worsening troponins, will consider further ischemic evaluation. NPO at MN.  2. HTN - SBP 110-130's - Continue amlodipine - Hold losartan with borderline creatinine 1.09 (baseline appears to be 1.1-1.3)  3. Cocaine use - States last cocaine use was 2 days ago - UDS positive for cocaine - Encouraged cessation  4. HL - Continue statin  For questions or updates, please contact [facility] Please consult [email or web address] for contact info under Cardiology/STEMI.    Signed, Alford Highland, NP  07/06/2017 1:33 PM

## 2017-07-06 NOTE — Progress Notes (Signed)
ANTICOAGULATION CONSULT NOTE - Initial Consult  Pharmacy Consult for Heparin  Indication: chest pain/ACS  Allergies  Allergen Reactions  . Propoxyphene N-Acetaminophen Nausea Only    REACTION: GI upset    Patient Measurements: Height: 5\' 11"  (180.3 cm) Weight: 160 lb (72.6 kg) IBW/kg (Calculated) : 75.3  Vital Signs: Temp: 98.3 F (36.8 C) (01/22 0256) Temp Source: Oral (01/22 0256) BP: 126/81 (01/22 0700) Pulse Rate: 55 (01/22 0700)  Labs: Recent Labs    07/06/17 0257  HGB 14.9  HCT 44.3  PLT 266  CREATININE 1.09    Estimated Creatinine Clearance: 77.7 mL/min (by C-G formula based on SCr of 1.09 mg/dL).   Medical History: Past Medical History:  Diagnosis Date  . Cocaine abuse (HCC)   . High cholesterol   . Hypertension   . MI (mitral incompetence)     Medications:  No current facility-administered medications on file prior to encounter.    Current Outpatient Medications on File Prior to Encounter  Medication Sig Dispense Refill  . amLODipine (NORVASC) 5 MG tablet Take 1 tablet (5 mg total) by mouth daily. 30 tablet 3  . aspirin EC 81 MG tablet Take 1 tablet (81 mg total) by mouth daily. 30 tablet 0  . isosorbide mononitrate (IMDUR) 30 MG 24 hr tablet Take 1 tablet (30 mg total) by mouth daily. 30 tablet 2  . losartan (COZAAR) 25 MG tablet Take 1 tablet (25 mg total) by mouth daily. 30 tablet 0  . nitroGLYCERIN (NITROSTAT) 0.4 MG SL tablet Place 1 tablet (0.4 mg total) under the tongue every 5 (five) minutes as needed for chest pain (for 3 doses). 30 tablet 0  . simvastatin (ZOCOR) 20 MG tablet Take 1 tablet (20 mg total) by mouth daily at 6 PM. (Patient taking differently: Take 20 mg by mouth daily. ) 30 tablet 0     Assessment: 57 y.o. male with chest pain and elevated troponins for heparin Goal of Therapy:  Heparin level 0.3-0.7 units/ml Monitor platelets by anticoagulation protocol: Yes   Plan:  Continue Heparin 850 units/hr  Check heparin level  in 6 hours.   Eddie Candle 07/06/2017,7:15 AM

## 2017-07-06 NOTE — ED Notes (Signed)
[facility] called reported iStat troponin 0.10. Paged admitting provider.

## 2017-07-06 NOTE — ED Notes (Signed)
[place] sandwich meal given to patient while he waits for a hot tray.

## 2017-07-06 NOTE — ED Provider Notes (Signed)
[facility] EMERGENCY DEPARTMENT Provider Note   CSN: [id number] Arrival date & time: 07/06/17  0246  Time seen 03:35 AM   History   Chief Complaint Chief Complaint  Patient presents with  . Chest Pain    HPI Bryce Ortega is a 57 y.o. male.  HPI patient states he woke up at 2 AM with some lower central sternal chest pain that he describes as tightness.  He states it made him feel short of breath and he got sweaty.  He did not have nausea, vomiting, or radiation of the pain.  He states deep breathing makes the pain worse.  Nitroglycerin given to him by EMS helped.  He states he has had this pain before and it was "nothing".  He states he has had 2 or 3 episodes.  He states he took 4 baby aspirin prior to EMS arrival and EMS gave him nitroglycerin x3, he states after each nitroglycerin his pain improved.  He states his last cocaine was a week ago.  PCP Placey, [name], NP   Past Medical History:  Diagnosis Date  . Cocaine abuse (HCC)   . High cholesterol   . Hypertension   . MI (mitral incompetence)     Patient Active Problem List   Diagnosis Date Noted  . Elevated troponin   . Cocaine adverse reaction 02/23/2017  . Chest pain 02/22/2017  . Mild CAD 02/22/2017  . NSTEMI (non-ST elevated myocardial infarction) (HCC) 05/15/2015  . Cocaine abuse (HCC) 05/15/2015  . Chest pain at rest   . Cardiac arrest (HCC) 05/10/2015  . Hyperlipidemia 05/10/2015  . Essential hypertension 01/10/2009  . SHOULDER PAIN, RIGHT 01/10/2009  . CERVICALGIA 01/10/2009  . LOW BACK PAIN 01/10/2009    Past Surgical History:  Procedure Laterality Date  . CARDIAC CATHETERIZATION N/A 05/10/2015   Procedure: Left Heart Cath and Coronary Angiography;  Surgeon: Dolores Patty, MD;  Location: [facility] INVASIVE CV LAB;  Service: Cardiovascular;  Laterality: N/A;  . TONSILLECTOMY         Home Medications    Prior to Admission medications   Medication Sig Start Date End Date  Taking? Authorizing Provider  amLODipine (NORVASC) 5 MG tablet Take 1 tablet (5 mg total) by mouth daily. 05/20/17  Yes Meredeth Ide, MD  aspirin EC 81 MG tablet Take 1 tablet (81 mg total) by mouth daily. 05/11/15  Yes Ghimire, Werner Lean, MD  isosorbide mononitrate (IMDUR) 30 MG 24 hr tablet Take 1 tablet (30 mg total) by mouth daily. 05/20/17  Yes Meredeth Ide, MD  losartan (COZAAR) 25 MG tablet Take 1 tablet (25 mg total) by mouth daily. 05/16/15  Yes Dhungel, Nishant, MD  nitroGLYCERIN (NITROSTAT) 0.4 MG SL tablet Place 1 tablet (0.4 mg total) under the tongue every 5 (five) minutes as needed for chest pain (for 3 doses). 05/11/15  Yes Ghimire, Werner Lean, MD  simvastatin (ZOCOR) 20 MG tablet Take 1 tablet (20 mg total) by mouth daily at 6 PM. Patient taking differently: Take 20 mg by mouth daily.  05/11/15  Yes Ghimire, Werner Lean, MD    Family History Family History  Problem Relation Age of Onset  . Hypertension Father   . Heart disease Father   . Heart disease Mother   . Heart disease Maternal Grandfather     Social History Social History   Tobacco Use  . Smoking status: Never Smoker  . Smokeless tobacco: Never Used  Substance Use Topics  . Alcohol  use: Yes  . Drug use: Yes    Types: Cocaine  employed Had 1 beer tonight   Allergies   Propoxyphene n-acetaminophen   Review of Systems Review of Systems  All other systems reviewed and are negative.    Physical Exam Updated Vital Signs BP 118/78   Pulse (!) 59   Temp 98.3 F (36.8 C) (Oral)   Resp 13   Ht 5\' 11"  (1.803 m)   Wt 72.6 kg (160 lb)   SpO2 96%   BMI 22.32 kg/m   Vital signs normal    Physical Exam  Constitutional: He is oriented to person, place, and time. He appears well-developed and well-nourished.  Non-toxic appearance. He does not appear ill. No distress.  HENT:  Head: Normocephalic and atraumatic.  Right Ear: External ear normal.  Left Ear: External ear normal.  Nose: Nose normal. No  mucosal edema or rhinorrhea.  Mouth/Throat: Oropharynx is clear and moist and mucous membranes are normal. No dental abscesses or uvula swelling.  Eyes: Conjunctivae and EOM are normal. Pupils are equal, round, and reactive to light.  Neck: Normal range of motion and full passive range of motion without pain. Neck supple.  Cardiovascular: Normal rate, regular rhythm and normal heart sounds. Exam reveals no gallop and no friction rub.  No murmur heard. Pulmonary/Chest: Effort normal and breath sounds normal. No respiratory distress. He has no wheezes. He has no rhonchi. He has no rales. He exhibits tenderness. He exhibits no crepitus.  Patient is tender in his lower sternal area which also reproduces the area where he states he had pain.    Abdominal: Soft. Normal appearance and bowel sounds are normal. He exhibits no distension. There is no tenderness. There is no rebound and no guarding.  Musculoskeletal: Normal range of motion. He exhibits no edema or tenderness.  Moves all extremities well.   Neurological: He is alert and oriented to person, place, and time. He has normal strength. No cranial nerve deficit.  Skin: Skin is warm, dry and intact. No rash noted. No erythema. No pallor.  Psychiatric: He has a normal mood and affect. His speech is normal and behavior is normal. His mood appears not anxious.  Nursing note and vitals reviewed.    ED Treatments / Results  Labs (all labs ordered are listed, but only abnormal results are displayed) Results for orders placed or performed during the hospital encounter of 07/06/17  Comprehensive metabolic panel  Result Value Ref Range   Sodium 137 135 - 145 mmol/L   Potassium 3.9 3.5 - 5.1 mmol/L   Chloride 107 101 - 111 mmol/L   CO2 23 22 - 32 mmol/L   Glucose, Bld 120 (H) 65 - 99 mg/dL   BUN 19 6 - 20 mg/dL   Creatinine, Ser 1.61 0.61 - 1.24 mg/dL   Calcium 8.1 (L) 8.9 - 10.3 mg/dL   Total Protein 4.3 (L) 6.5 - 8.1 g/dL   Albumin 2.6 (L)  3.5 - 5.0 g/dL   AST 21 15 - 41 U/L   ALT 20 17 - 63 U/L   Alkaline Phosphatase 41 38 - 126 U/L   Total Bilirubin 0.6 0.3 - 1.2 mg/dL   GFR calc non Af Amer >60 >60 mL/min   GFR calc Af Amer >60 >60 mL/min   Anion gap 7 5 - 15  CBC with Differential  Result Value Ref Range   WBC 8.4 4.0 - 10.5 K/uL   RBC 4.77 4.22 - 5.81 MIL/uL  Hemoglobin 14.9 13.0 - 17.0 g/dL   HCT 62.8 36.6 - 29.4 %   MCV 92.9 78.0 - 100.0 fL   MCH 31.2 26.0 - 34.0 pg   MCHC 33.6 30.0 - 36.0 g/dL   RDW 76.5 46.5 - 03.5 %   Platelets 266 150 - 400 K/uL   Neutrophils Relative % 60 %   Neutro Abs 5.0 1.7 - 7.7 K/uL   Lymphocytes Relative 26 %   Lymphs Abs 2.2 0.7 - 4.0 K/uL   Monocytes Relative 9 %   Monocytes Absolute 0.8 0.1 - 1.0 K/uL   Eosinophils Relative 4 %   Eosinophils Absolute 0.3 0.0 - 0.7 K/uL   Basophils Relative 1 %   Basophils Absolute 0.0 0.0 - 0.1 K/uL  Urine rapid drug screen (hosp performed)  Result Value Ref Range   Opiates NONE DETECTED NONE DETECTED   Cocaine POSITIVE (A) NONE DETECTED   Benzodiazepines NONE DETECTED NONE DETECTED   Amphetamines NONE DETECTED NONE DETECTED   Tetrahydrocannabinol NONE DETECTED NONE DETECTED   Barbiturates NONE DETECTED NONE DETECTED  I-stat troponin, ED  Result Value Ref Range   Troponin i, poc 0.01 0.00 - 0.08 ng/mL   Comment 3          I-stat troponin, ED  Result Value Ref Range   Troponin i, poc 0.07 0.00 - 0.08 ng/mL   Comment 3          I-stat troponin, ED  Result Value Ref Range   Troponin i, poc 0.09 (HH) 0.00 - 0.08 ng/mL   Comment NOTIFIED PHYSICIAN    Comment 3           Laboratory interpretation all normal except 3rd troponin was positive, UDS + for cocaine despite patient stating no cocaine in a week.     EKG  EKG Interpretation  Date/Time:  Tuesday July 06 2017 02:53:40 EST Ventricular Rate:  66 PR Interval:    QRS Duration: 77 QT Interval:  394 QTC Calculation: 413 R Axis:   52 Text Interpretation:  Sinus rhythm  Normal ECG No significant change since last tracing 17 May 2017 Confirmed by Devoria Albe ([id number]) on 07/06/2017 3:06:27 AM       Radiology No results found.  Procedures Procedures (including critical care time)  Medications Ordered in ED Medications  heparin ADULT infusion 100 units/mL (25000 units/263mL sodium chloride 0.45%) (not administered)  heparin injection 4,000 Units (not administered)  nitroGLYCERIN (NITROGLYN) 2 % ointment 1 inch (1 inch Topical Given 07/06/17 0410)     Initial Impression / Assessment and Plan / ED Course  I have reviewed the triage vital signs and the nursing notes.  Pertinent labs & imaging results that were available during my care of the patient were reviewed by me and considered in my medical decision making (see chart for details).     Patient was just discharged on December 5 for similar episode of chest pain with positive troponin.  He also had a positive UDS for cocaine at that time.  Patient has had V. tach arrest in the past related to cocaine abuse felt to be from coronary spasm.  He had cardiac catheterization in 2016 which did not show any significant coronary artery obstruction.  Patient was to follow-up with cardiology 2 weeks after he was discharged from the hospital in December.  Patient already took aspirin at home.  Nitroglycerin paste was placed on his chest while awaiting his evaluation.  We discussed getting a 2-3-hour repeat troponin.  Recheck at 6:45 AM patient states he still having some pain however he was sleeping peacefully.  Patient discussed with cardiology, and PA at 6:56 AM.  She recommends starting on heparin and keeping patient n.p.o.  She is asked that the hospitalist admit and they will consult.  7:20 AM Dr. Luberta Robertson, hospitalist will admit.  Final Clinical Impressions(s) / ED Diagnoses   Final diagnoses:  Cocaine abuse (HCC)  Chest pain, unspecified type  Elevated troponin level    Plan admission  Devoria Albe,  MD, Concha Pyo, MD 07/06/17 [phone]

## 2017-07-07 DIAGNOSIS — T405X5D Adverse effect of cocaine, subsequent encounter: Secondary | ICD-10-CM

## 2017-07-07 DIAGNOSIS — N179 Acute kidney failure, unspecified: Secondary | ICD-10-CM

## 2017-07-07 DIAGNOSIS — I249 Acute ischemic heart disease, unspecified: Secondary | ICD-10-CM

## 2017-07-07 DIAGNOSIS — R079 Chest pain, unspecified: Secondary | ICD-10-CM

## 2017-07-07 DIAGNOSIS — I214 Non-ST elevation (NSTEMI) myocardial infarction: Secondary | ICD-10-CM

## 2017-07-07 LAB — CBC
HEMATOCRIT: 47.3 % (ref 39.0–52.0)
HEMOGLOBIN: 15.2 g/dL (ref 13.0–17.0)
MCH: 30.6 pg (ref 26.0–34.0)
MCHC: 32.1 g/dL (ref 30.0–36.0)
MCV: 95.2 fL (ref 78.0–100.0)
Platelets: 273 10*3/uL (ref 150–400)
RBC: 4.97 MIL/uL (ref 4.22–5.81)
RDW: 13.8 % (ref 11.5–15.5)
WBC: 6.6 10*3/uL (ref 4.0–10.5)

## 2017-07-07 LAB — BASIC METABOLIC PANEL
ANION GAP: 11 (ref 5–15)
BUN: 12 mg/dL (ref 6–20)
CHLORIDE: 105 mmol/L (ref 101–111)
CO2: 23 mmol/L (ref 22–32)
Calcium: 8.2 mg/dL — ABNORMAL LOW (ref 8.9–10.3)
Creatinine, Ser: 1.29 mg/dL — ABNORMAL HIGH (ref 0.61–1.24)
GFR calc Af Amer: >60 mL/min (ref >60)
GFR calc non Af Amer: >60 mL/min (ref >60)
Glucose, Bld: 112 mg/dL — ABNORMAL HIGH (ref 65–99)
POTASSIUM: 4.4 mmol/L (ref 3.5–5.1)
SODIUM: 139 mmol/L (ref 135–145)

## 2017-07-07 LAB — TROPONIN I: Troponin I: <0.03 ng/mL (ref <0.03)

## 2017-07-07 LAB — LIPID PANEL
CHOL/HDL RATIO: 2.6 ratio
Cholesterol: 134 mg/dL (ref 0–200)
HDL: 52 mg/dL (ref >40)
LDL CALC: 71 mg/dL (ref 0–99)
TRIGLYCERIDES: 55 mg/dL (ref <150)
VLDL: 11 mg/dL (ref 0–40)

## 2017-07-07 LAB — HEPARIN LEVEL (UNFRACTIONATED): Heparin Unfractionated: 0.43 IU/mL (ref 0.30–0.70)

## 2017-07-07 MED ORDER — HEPARIN BOLUS VIA INFUSION
2000.0000 [IU] | Freq: Once | INTRAVENOUS | Status: AC
  Administered 2017-07-07: 2000 [IU] via INTRAVENOUS
  Filled 2017-07-07: qty 2000

## 2017-07-07 MED ORDER — AMLODIPINE BESYLATE 5 MG PO TABS: 5.0000 mg | ORAL_TABLET | Freq: Every day | ORAL | 3 refills | Status: DC

## 2017-07-07 MED ORDER — LOSARTAN POTASSIUM 25 MG PO TABS: 25.0000 mg | ORAL_TABLET | Freq: Every day | ORAL | 2 refills | Status: DC

## 2017-07-07 MED ORDER — SIMVASTATIN 20 MG PO TABS: 20.0000 mg | ORAL_TABLET | Freq: Every day | ORAL | 2 refills | Status: DC

## 2017-07-07 MED ORDER — ISOSORBIDE MONONITRATE ER 30 MG PO TB24: 30.0000 mg | ORAL_TABLET | Freq: Every day | ORAL | 2 refills | Status: DC

## 2017-07-07 MED ORDER — NITROGLYCERIN 0.4 MG SL SUBL: 0.4000 mg | SUBLINGUAL_TABLET | SUBLINGUAL | 0 refills | Status: DC | PRN

## 2017-07-07 NOTE — Progress Notes (Addendum)
Progress Note  Patient Name: KEMAR CILIBERTI Date of Encounter: 07/07/2017  Primary Cardiologist: Lyn Records III, MD   Subjective   No CP, SOB, dizziness, or lightheadedness overnight. Has been ambulating to the bathroom with no symptoms. Wants to eat.   Inpatient Medications    Scheduled Meds: . amLODipine  5 mg Oral Daily  . aspirin EC  81 mg Oral Daily  . isosorbide mononitrate  30 mg Oral Daily  . simvastatin  20 mg Oral q1800  . sodium chloride flush  3 mL Intravenous Q12H   Continuous Infusions: . sodium chloride    . heparin 1,200 Units/hr (07/07/17 0143)   PRN Meds: sodium chloride, nitroGLYCERIN, ondansetron (ZOFRAN) IV, sodium chloride flush   Vital Signs    Vitals:   07/06/17 1549 07/06/17 2003 07/07/17 0342 07/07/17 1005  BP: 121/70 124/79 (!) 137/96 135/90  Pulse:  70 63   Resp: 16 14 18  (!) 21  Temp: 98.2 F (36.8 C) 97.8 F (36.6 C) 98.1 F (36.7 C)   TempSrc: Oral Oral Oral   SpO2:      Weight:   157 lb 3.2 oz (71.3 kg)   Height:        Intake/Output Summary (Last 24 hours) at 07/07/2017 1241 Last data filed at 07/07/2017 0300 Gross per 24 hour  Intake 95.4 ml  Output -  Net 95.4 ml   Filed Weights   07/06/17 0256 07/07/17 0342  Weight: 160 lb (72.6 kg) 157 lb 3.2 oz (71.3 kg)    Telemetry    SB-SR 50-70's - Personally Reviewed  ECG    No new tracings.  Physical Exam   GEN: No acute distress.   Neck: No JVD Cardiac: RRR, no murmurs, rubs, or gallops.  Respiratory: Clear to auscultation bilaterally. GI: Soft, nontender, non-distended  MS: No edema; No deformity. Neuro:  Nonfocal  Psych: Normal affect   Labs    Chemistry Recent Labs  Lab 07/06/17 0257 07/07/17 0743  NA 137 139  K 3.9 4.4  CL 107 105  CO2 23 23  GLUCOSE 120* 112*  BUN 19 12  CREATININE 1.09 1.29*  CALCIUM 8.1* 8.2*  PROT 4.3*  --   ALBUMIN 2.6*  --   AST 21  --   ALT 20  --   ALKPHOS 41  --   BILITOT 0.6  --   GFRNONAA >60 >60  GFRAA  >60 >60  ANIONGAP 7 11     Hematology Recent Labs  Lab 07/06/17 0257 07/07/17 0743  WBC 8.4 6.6  RBC 4.77 4.97  HGB 14.9 15.2  HCT 44.3 47.3  MCV 92.9 95.2  MCH 31.2 30.6  MCHC 33.6 32.1  RDW 13.3 13.8  PLT 266 273    Cardiac Enzymes Recent Labs  Lab 07/06/17 1845 07/06/17 2256 07/07/17 0743  TROPONINI <0.03 <0.03 <0.03    Recent Labs  Lab 07/06/17 0310 07/06/17 0453 07/06/17 0544 07/06/17 1149  TROPIPOC 0.01 0.07 0.09* 0.10*     BNPNo results for input(s): BNP, PROBNP in the last 168 hours.   DDimer No results for input(s): DDIMER in the last 168 hours.   Radiology    No results found.  Cardiac Studies   Echo 05/18/17: Study Conclusions - Left ventricle: The cavity size was normal. Wall thickness was  increased in a pattern of mild LVH. There was focal basal  hypertrophy. Systolic function was normal. The estimated ejection fraction was in the range of 50% to 55%. Wall  motion was normal; there were no regional wall motion abnormalities. Left ventricular diastolic function parameters were normal.  LHC 05/10/2015  Mid RCA lesion, 40% stenosed.  Dist LAD lesion, 30% stenosed.  The left ventricular systolic function is normal. 1. Mild non-obstructive CAD 2. Normal LV function  Patient Profile     57 y.o. male with a hx of cocaine abuse, nonobstructive CAD, HTN, Vfib arrest s/p cocaine use (2016), and HL who is being seen for the evaluation of chest pain  Assessment & Plan    1. Chest pain, prior Vfib arrest 2/ cocaine intoxication - No further CP since SL nitro administered by EMS. Likely r/t to cocaine use.  - troponins trended overnight were negative x 3 - POC mildly elevated in ER yesterday morning: 0.07 --> 0.09 --> 0.10. Negative x3. - D/C heparin drip - EKG stable Chest pain is likely not ACS in origin. This may be related to vasospasm following cocaine use in the setting of missing his medications, including imdur and amlodipine. He has not  had a recurrence of chest pain. Plan to discharge with medication refills. He may follow up with his PCP to obtain refills. Follow up with cardiology as needed.  2. HTN - BP 130/90's - Hold losartan with creatinine 1.29 this am (baseline appears to be 1.1-1.3) - Continue amlodipine. Consider increasing if he needs to stay off losartan.  3. Cocaine use - States last cocaine use was 2 days prior to admission - UDS positive for cocaine - Encouraged cessation  4. HL - Continue statin   For questions or updates, please contact [facility] Please consult [email or web address] for contact info under Cardiology/STEMI.      Signed, Alford Highland, NP  07/07/2017, 12:41 PM     Attending Addendum:  History and all data above reviewed.  Patient examined.  I agree with the findings as above.  All available labs, radiology testing, previous records reviewed. Agree with documented assessment and plan. Mr. Shirah is a 57M with non-obstructive CAD, hypertension, hyperlipidemia, cocaine abuse and prior VF arrest here with chest pain in the setting of cocaine abuse and medication non-compliance.  Troponin was undetectable and no ischemia on EKG.  We have resumed nitroglycerin.  This was likely vasospasm in the setting of cocaine use.  Restart home losartan tomorrow.  He is stable for discharge from a cardiac standpoint.   Dondi Burandt C. Duke Salvia, MD, [facility]  07/07/2017 2:36 PM

## 2017-07-07 NOTE — Progress Notes (Deleted)
Progress Note    Bryce Ortega  VQX:[id number] DOB: [birth year]  DOA: 07/06/2017 PCP: Lavinia Sharps, NP    Brief Narrative:   Chief complaint: Chest pain  Medical records reviewed and are as summarized below:  Bryce Ortega is an 57 y.o. male with a PMH of cocaine abuse complicated by ventricular fibrillation arrest who was admitted 07/06/17 for chief complaint of chest pain located in the midsternum and sharp in quality, reportedly identical to chest pain he has had in the past after cocaine use. Initially took aspirin and was given nitroglycerin 3 by EMS with some improvement. In the ED, he was treated with oxygen and started on a heparin drip. Troponins were negative 2.  Assessment/Plan:   Principal Problem:   Ischemia, myocardial, acute (HCC) secondary to cocaine abuse in the setting of known nonobstructive coronary artery disease/non-ST elevation MI/elevated troponin Cardiology consulted. Known history of nonobstructive coronary disease by coronary angiography done in 2016. Troponin less than 0.032 but peaked at 0.10. EKG negative for ischemic findings. Possibly coronary spasm versus noncompliance with medical therapy. Avoid beta blockers given cocaine abuse. Continue aspirin, heparin, isosorbide mononitrate and statin. Continue nitroglycerin as needed. Doubt he will need further cardiac catheterization.  Active Problems:   Hypertension Continue Cozaar and Norvasc.    Medical noncompliance Counseled.    Cocaine abuse (HCC) Poor insight and reports that he's not interested in quitting.  Family Communication/Anticipated D/C date and plan/Code Status   DVT prophylaxis: Heparin ordered. Code Status: Full Code.  Family Communication: No family at the bedside. Disposition Plan: Home when cleared by cardiology.   Medical Consultants:    Cardiology   Anti-Infectives:    None  Subjective:   Patient denies current chest pain, shortness of breath,  nausea.  Objective:    Vitals:   07/06/17 1400 07/06/17 1549 07/06/17 2003 07/07/17 0342  BP: 110/70 121/70 124/79 (!) 137/96  Pulse: (!) 57  70 63  Resp: 16 16 14 18   Temp:  98.2 F (36.8 C) 97.8 F (36.6 C) 98.1 F (36.7 C)  TempSrc:  Oral Oral Oral  SpO2: 97%     Weight:    71.3 kg (157 lb 3.2 oz)  Height:        Intake/Output Summary (Last 24 hours) at 07/07/2017 0741 Last data filed at 07/07/2017 0300 Gross per 24 hour  Intake 95.4 ml  Output -  Net 95.4 ml   Filed Weights   07/06/17 0256 07/07/17 0342  Weight: 72.6 kg (160 lb) 71.3 kg (157 lb 3.2 oz)    Exam: General: No acute distress. Cardiovascular: Heart sounds show a regular rate, and rhythm. No gallops or rubs. No murmurs. No JVD. Lungs: Clear to auscultation bilaterally with good air movement. No rales, rhonchi or wheezes. Abdomen: Soft, nontender, nondistended with normal active bowel sounds. No masses. No hepatosplenomegaly. Neurological: Alert and oriented 3. Moves all extremities 4 with equal strength. Cranial nerves II through XII grossly intact. Skin: Warm and dry. No rashes or lesions. Extremities: No clubbing or cyanosis. No edema. Pedal pulses 2+. Psychiatric: Mood and affect are normal. Insight and judgment are poor.   Data Reviewed:   I have personally reviewed following labs and imaging studies:  Labs: Labs show the following:   Basic Metabolic Panel: Recent Labs  Lab 07/06/17 0257  NA 137  K 3.9  CL 107  CO2 23  GLUCOSE 120*  BUN 19  CREATININE 1.09  CALCIUM 8.1*   GFR  Estimated Creatinine Clearance: 76.3 mL/min (by C-G formula based on SCr of 1.09 mg/dL). Liver Function Tests: Recent Labs  Lab 07/06/17 0257  AST 21  ALT 20  ALKPHOS 41  BILITOT 0.6  PROT 4.3*  ALBUMIN 2.6*   CBC: Recent Labs  Lab 07/06/17 0257  WBC 8.4  NEUTROABS 5.0  HGB 14.9  HCT 44.3  MCV 92.9  PLT 266   Cardiac Enzymes: Recent Labs  Lab 07/06/17 1845 07/06/17 2256  TROPONINI  <0.03 <0.03    Microbiology Recent Results (from the past 240 hour(s))  MRSA PCR Screening     Status: None   Collection Time: 07/06/17  5:02 PM  Result Value Ref Range Status   MRSA by PCR NEGATIVE NEGATIVE Final    Comment:        The GeneXpert MRSA Assay (FDA approved for NASAL specimens only), is one component of a comprehensive MRSA colonization surveillance program. It is not intended to diagnose MRSA infection nor to guide or monitor treatment for MRSA infections.     Procedures and diagnostic studies:  No results found.  Medications:   . amLODipine  5 mg Oral Daily  . aspirin  324 mg Oral NOW   Or  . aspirin  300 mg Rectal NOW  . aspirin EC  81 mg Oral Daily  . isosorbide mononitrate  30 mg Oral Daily  . simvastatin  20 mg Oral q1800  . sodium chloride flush  3 mL Intravenous Q12H   Continuous Infusions: . sodium chloride    . heparin 1,200 Units/hr (07/07/17 0143)     LOS: 0 days   Hillery Aldo  [facility] Pager [phone]. If unable to reach me by pager, please call my cell phone at [phone].  *Please refer to [email or web address], password [email or web address] to get updated schedule on who will round on this patient, as hospitalists switch teams weekly. If 7PM-7AM, please contact night-coverage at [email or web address], password [email or web address] for any overnight needs.  07/07/2017, 7:41 AM

## 2017-07-07 NOTE — Progress Notes (Signed)
ANTICOAGULATION CONSULT NOTE  Pharmacy Consult for Heparin  Indication: chest pain/ACS  Allergies  Allergen Reactions  . Propoxyphene N-Acetaminophen Nausea Only    REACTION: GI upset    Patient Measurements: Height: 5\' 11"  (180.3 cm) Weight: 157 lb 3.2 oz (71.3 kg) IBW/kg (Calculated) : 75.3  Vital Signs: Temp: 98.1 F (36.7 C) (01/23 0342) Temp Source: Oral (01/23 0342) BP: 135/90 (01/23 1005) Pulse Rate: 63 (01/23 0342)  Labs: Recent Labs    07/06/17 0257 07/06/17 1544 07/06/17 1845 07/06/17 2256 07/06/17 2257 07/07/17 0743  HGB 14.9  --   --   --   --  15.2  HCT 44.3  --   --   --   --  47.3  PLT 266  --   --   --   --  273  HEPARINUNFRC  --  0.12*  --   --  0.15* 0.43  CREATININE 1.09  --   --   --   --  1.29*  TROPONINI  --   --  <0.03 <0.03  --  <0.03    Estimated Creatinine Clearance: 64.5 mL/min (A) (by C-G formula based on SCr of 1.29 mg/dL (H)).   Medical History: Past Medical History:  Diagnosis Date  . Cocaine abuse (HCC)   . H/O ventricular fibrillation 2016   s/p cocaine use  . High cholesterol   . Hypertension   . MI (mitral incompetence)   . Myocardial infarction [facility]) 2016   "related to cocaine"  . Nonobstructive atherosclerosis of coronary artery 2016   LHC showed normal EF and mild non-obstructive CAD    Assessment: 57 y.o. male with chest pain and elevated troponins on heparin. Per notes if no further EKG changes and no further troponin rise no further work-up is needed. Plans noted for d/c heparin  -heparin level at goal   Goal of Therapy:  Heparin level 0.3-0.7 units/ml Monitor platelets by anticoagulation protocol: Yes   Plan:  -no heparin changes needed -Plans noted for d/c heparin   Harland German, Pharm D 07/07/2017 1:28 PM

## 2017-07-07 NOTE — Progress Notes (Signed)
ANTICOAGULATION CONSULT NOTE   Pharmacy Consult for Heparin  Indication: chest pain/ACS  Allergies  Allergen Reactions  . Propoxyphene N-Acetaminophen Nausea Only    REACTION: GI upset    Patient Measurements: Height: 5\' 11"  (180.3 cm) Weight: 160 lb (72.6 kg) IBW/kg (Calculated) : 75.3  Vital Signs: Temp: 97.8 F (36.6 C) (01/22 2003) Temp Source: Oral (01/22 2003) BP: 124/79 (01/22 2003) Pulse Rate: 70 (01/22 2003)  Labs: Recent Labs    07/06/17 0257 07/06/17 1544 07/06/17 1845 07/06/17 2256 07/06/17 2257  HGB 14.9  --   --   --   --   HCT 44.3  --   --   --   --   PLT 266  --   --   --   --   HEPARINUNFRC  --  0.12*  --   --  0.15*  CREATININE 1.09  --   --   --   --   TROPONINI  --   --  <0.03 <0.03  --     Estimated Creatinine Clearance: 77.7 mL/min (by C-G formula based on SCr of 1.09 mg/dL).   Assessment: 57 y.o. male with chest pain for heparin Goal of Therapy:  Heparin level 0.3-0.7 units/ml Monitor platelets by anticoagulation protocol: Yes   Plan:  Heparin 2000 units IV bolus, then increase heparin 1200 units/hr Follow-up am labs.  Lanyia Jewel, Gary Fleet 07/07/2017,12:53 AM

## 2017-07-07 NOTE — Discharge Summary (Signed)
Physician Discharge Summary  Bryce Ortega:[id number] DOB: 11-Feb-1961 DOA: 07/06/2017  PCP: Lavinia Sharps, NP  Admit date: 07/06/2017 Discharge date: 07/07/2017  Admitted From: Home Discharge disposition: Home   Recommendations for Outpatient Follow-Up:   1. Recommend outpatient cardiology follow up.   Discharge Diagnosis:   Principal Problem:   Ischemia, myocardial, acute (HCC) Active Problems:   NSTEMI (non-ST elevated myocardial infarction) (HCC) versus coronary vasospasm with troponin leak   Cocaine abuse (HCC)   Cocaine adverse reaction   Elevated troponin   Acute kidney injury   Discharge Condition: Improved.  Diet recommendation: Low sodium, heart healthy.    CODE STATUS: Full  History of Present Illness:   Bryce Ortega is an 57 y.o. male with a PMH of cocaine abuse complicated by ventricular fibrillation arrest who was admitted 07/06/17 for chief complaint of chest pain located in the midsternum and sharp in quality, reportedly identical to chest pain he has had in the past after cocaine use. Initially took aspirin and was given nitroglycerin 3 by EMS with some improvement. In the ED, he was treated with oxygen and started on a heparin drip. Troponins were negative 2.  Hospital Course by Problem:   Principal Problem:   Ischemia, myocardial, acute (HCC) secondary to cocaine abuse in the setting of known nonobstructive coronary artery disease/non-ST elevation MI/elevated troponin Cardiology consulted. Known history of nonobstructive coronary disease by coronary angiography done in 2016. Troponin less than 0.032 but peaked at 0.10. EKG negative for ischemic findings. Possibly coronary spasm versus noncompliance with medical therapy. Avoid beta blockers given cocaine abuse. Initially treated with aspirin, heparin, isosorbide mononitrate and statin. Continue nitroglycerin as needed. No need for further ischemic evaluation and cleared for discharge by  cardiology.  Active Problems:   Hypertension Continue Cozaar (but holding today) and Norvasc.    Medical noncompliance Counseled.    Cocaine abuse (HCC) Poor insight and reports that he's not interested in quitting.    Acute kidney injury Cozaar held. Okay to resume post discharge on 07/08/17.  Medical Consultants:    Cardiology   Discharge Exam:   Vitals:   07/07/17 0342 07/07/17 1005  BP: (!) 137/96 135/90  Pulse: 63   Resp: 18 (!) 21  Temp: 98.1 F (36.7 C)   SpO2:     Vitals:   07/06/17 1549 07/06/17 2003 07/07/17 0342 07/07/17 1005  BP: 121/70 124/79 (!) 137/96 135/90  Pulse:  70 63   Resp: 16 14 18  (!) 21  Temp: 98.2 F (36.8 C) 97.8 F (36.6 C) 98.1 F (36.7 C)   TempSrc: Oral Oral Oral   SpO2:      Weight:   71.3 kg (157 lb 3.2 oz)   Height:       General: No acute distress. Cardiovascular: Heart sounds show a regular rate, and rhythm. No gallops or rubs. No murmurs. No JVD. Lungs: Clear to auscultation bilaterally with good air movement. No rales, rhonchi or wheezes. Abdomen: Soft, nontender, nondistended with normal active bowel sounds. No masses. No hepatosplenomegaly. Neurological: Alert and oriented 3. Moves all extremities 4 with equal strength. Cranial nerves II through XII grossly intact. Skin: Warm and dry. No rashes or lesions. Extremities: No clubbing or cyanosis. No edema. Pedal pulses 2+. Psychiatric: Mood and affect are normal. Insight and judgment are poor.   The results of significant diagnostics from this hospitalization (including imaging, microbiology, ancillary and laboratory) are listed below for reference.     Procedures and Diagnostic Studies:  No results found.   Labs:   Basic Metabolic Panel: Recent Labs  Lab 07/06/17 0257 07/07/17 0743  NA 137 139  K 3.9 4.4  CL 107 105  CO2 23 23  GLUCOSE 120* 112*  BUN 19 12  CREATININE 1.09 1.29*  CALCIUM 8.1* 8.2*   GFR Estimated Creatinine Clearance: 64.5  mL/min (A) (by C-G formula based on SCr of 1.29 mg/dL (H)). Liver Function Tests: Recent Labs  Lab 07/06/17 0257  AST 21  ALT 20  ALKPHOS 41  BILITOT 0.6  PROT 4.3*  ALBUMIN 2.6*   CBC: Recent Labs  Lab 07/06/17 0257 07/07/17 0743  WBC 8.4 6.6  NEUTROABS 5.0  --   HGB 14.9 15.2  HCT 44.3 47.3  MCV 92.9 95.2  PLT 266 273   Cardiac Enzymes: Recent Labs  Lab 07/06/17 1845 07/06/17 2256 07/07/17 0743  TROPONINI <0.03 <0.03 <0.03   Lipid Profile Recent Labs    07/07/17 0743  CHOL 134  HDL 52  LDLCALC 71  TRIG 55  CHOLHDL 2.6   Microbiology Recent Results (from the past 240 hour(s))  MRSA PCR Screening     Status: None   Collection Time: 07/06/17  5:02 PM  Result Value Ref Range Status   MRSA by PCR NEGATIVE NEGATIVE Final    Comment:        The GeneXpert MRSA Assay (FDA approved for NASAL specimens only), is one component of a comprehensive MRSA colonization surveillance program. It is not intended to diagnose MRSA infection nor to guide or monitor treatment for MRSA infections.      Discharge Instructions:   Discharge Instructions    Call MD for:  difficulty breathing, headache or visual disturbances   Complete by:  As directed    Call MD for:  extreme fatigue   Complete by:  As directed    Call MD for:  severe uncontrolled pain   Complete by:  As directed    Diet - low sodium heart healthy   Complete by:  As directed    Discharge instructions   Complete by:  As directed    Ongoing cocaine use can cause life threatening complications including heart attack and stroke. We recommend that you discontinue your use of cocaine. Failure to do so may result in premature death.   Increase activity slowly   Complete by:  As directed      Allergies as of 07/07/2017      Reactions   Propoxyphene N-acetaminophen Nausea Only   REACTION: GI upset      Medication List    TAKE these medications   amLODipine 5 MG tablet Commonly known as:   NORVASC Take 1 tablet (5 mg total) by mouth daily.   aspirin EC 81 MG tablet Take 1 tablet (81 mg total) by mouth daily.   isosorbide mononitrate 30 MG 24 hr tablet Commonly known as:  IMDUR Take 1 tablet (30 mg total) by mouth daily.   losartan 25 MG tablet Commonly known as:  COZAAR Take 1 tablet (25 mg total) by mouth daily.   nitroGLYCERIN 0.4 MG SL tablet Commonly known as:  NITROSTAT Place 1 tablet (0.4 mg total) under the tongue every 5 (five) minutes as needed for chest pain (for 3 doses).   simvastatin 20 MG tablet Commonly known as:  ZOCOR Take 1 tablet (20 mg total) by mouth daily at 6 PM. What changed:  when to take this      Follow-up Information  Placey, [name], NP. Schedule an appointment as soon as possible for a visit in 2 week(s).   Why:  Hospital follow up. Contact information: [address] [place] [address] [phone]        Lyn Records, MD Follow up.   Specialty:  Cardiology Contact information: [address] [place] [address] [place] [address] [phone]            Time coordinating discharge: 35 minutes.  SignedTrula Ore Rama  Pager [phone] [facility] 07/07/2017, 3:05 PM

## 2017-07-07 NOTE — Care Management Note (Signed)
Case Management Note  Patient Details  Name: NIXEN AVALO MRN: [id number] Date of Birth: 06/16/1960  Subjective/Objective:    Chest pain, NSTEMI, cocaine abuse, noncompliance with medications                Action/Plan: Spoke to pt and states he goes to [facility] and sees NP, [name] Placey. [facility] but has not picked up meds. He recently started a new full-time job. Explained to pt the importance of compliance with follow up with MD and taking meds post dc. Will continue to follow for dc needs.   Expected Discharge Date:             Expected Discharge Plan:  Home/Self Care  In-House Referral:  NA  Discharge planning Services  CM Consult, Medication Assistance  Post Acute Care Choice:  NA Choice offered to:  NA  DME Arranged:  N/A DME Agency:  NA  HH Arranged:  NA HH Agency:  NA  Status of Service:  Completed, signed off  If discussed at Long Length of Stay Meetings, dates discussed:    Additional Comments:  Elliot Cousin, RN 07/07/2017, 10:39 AM

## 2017-09-30 ENCOUNTER — Emergency Department (HOSPITAL_COMMUNITY)
Admission: EM | Admit: 2017-09-30 | Discharge: 2017-09-30 | Disposition: A | Discharge status: Home / Self Care | Payer: Self-pay | Attending: Emergency Medicine | Admitting: Emergency Medicine

## 2017-09-30 ENCOUNTER — Other Ambulatory Visit: Payer: Self-pay

## 2017-09-30 ENCOUNTER — Emergency Department (HOSPITAL_COMMUNITY): Payer: Self-pay

## 2017-09-30 ENCOUNTER — Encounter (HOSPITAL_COMMUNITY): Payer: Self-pay

## 2017-09-30 DIAGNOSIS — Z7982 Long term (current) use of aspirin: Secondary | ICD-10-CM | POA: Insufficient documentation

## 2017-09-30 DIAGNOSIS — M545 Low back pain, unspecified: Secondary | ICD-10-CM

## 2017-09-30 DIAGNOSIS — Z79899 Other long term (current) drug therapy: Secondary | ICD-10-CM | POA: Insufficient documentation

## 2017-09-30 DIAGNOSIS — I10 Essential (primary) hypertension: Secondary | ICD-10-CM | POA: Insufficient documentation

## 2017-09-30 MED ORDER — LIDOCAINE 5 % EX PTCH: 1.0000 | MEDICATED_PATCH | CUTANEOUS | 0 refills | Status: DC

## 2017-09-30 MED ORDER — METHOCARBAMOL 500 MG PO TABS: 500.0000 mg | ORAL_TABLET | Freq: Three times a day (TID) | ORAL | 0 refills | Status: DC | PRN

## 2017-09-30 MED ORDER — OXYCODONE-ACETAMINOPHEN 5-325 MG PO TABS
1.0000 | ORAL_TABLET | ORAL | Status: DC | PRN
  Administered 2017-09-30: 1 via ORAL
  Filled 2017-09-30: qty 1

## 2017-09-30 NOTE — Discharge Instructions (Addendum)
You were seen in the emergency department today for back pain following heavy lifting.  Your x-ray did not show acute findings of fracture or dislocation, it did show some degeneration which is consistent with aging.  We suspect her pain to be muscle related.  We are sending you home with 2 prescriptions: -Robaxin-Robaxin is the muscle relaxer I have prescribed, this is meant to help with muscle tightness. Be aware that this medication may make you drowsy therefore the first time you take this it should be at a time you are in an environment where you can rest. Do not drive or operate heavy machinery when taking this medication.  -Lidoderm patches-these are topical patches which she can place over the areas of pain, he may use these daily.  This will help to numb/to these areas.  These are expensive at the store asked the pharmacist for the over-the-counter version instead. We have prescribed you new medication(s) today. Discuss the medications prescribed today with your pharmacist as they can have adverse effects and interactions with your other medicines including over the counter and prescribed medications. Seek medical evaluation if you start to experience new or abnormal symptoms after taking one of these medicines, seek care immediately if you start to experience difficulty breathing, feeling of your throat closing, facial swelling, or rash as these could be indications of a more serious allergic reaction   You may take Tylenol with these medications safely, use over-the-counter dosing instructions.  Apply heating pad to the lower back.  Be sure to rest but do not stay in bed as this can worsen your symptoms.  Follow-up with your primary care provider in 3 days for reevaluation.  Return to the ER for new or worsening symptoms including but not limited to numbness, weakness, loss of control of bladder function, worsening pain, or any other concerns.

## 2017-09-30 NOTE — ED Triage Notes (Signed)
Pt states he felt something pull in back today while loading wood into a truck about 30 mins pta. PT reports he was able to ambulate, but was painful to do so. Urinating with no problems. Pt restless and appears uncomfortable in triage

## 2017-09-30 NOTE — ED Provider Notes (Signed)
[facility] EMERGENCY DEPARTMENT Provider Note   CSN: [id number] Arrival date & time: 09/30/17  1000     History   Chief Complaint Chief Complaint  Patient presents with  . Back Pain    HPI Bryce Ortega is a 57 y.o. male with a hx of CAD, HTN, hyperlipidemia, and cocaine abuse who presents to the ED complaining of lower back pain since 0900 this AM. Patient states he was lifting something heavy, lifted with his back, with onset of diffuse lower back pain. States he feels he pulled something. Rates current pain an 8/10 in severity which was improved following percocet given in triage. Pain worsens with twisting/turning movements and ambulating. Denies numbness, tingling, weakness, incontinence to bowel/bladder, fever, chills, IV drug use, or hx of cancer.  No other areas of injury.   HPI  Past Medical History:  Diagnosis Date  . Cocaine abuse (HCC)   . H/O ventricular fibrillation 2016   s/p cocaine use  . High cholesterol   . Hypertension   . MI (mitral incompetence)   . Myocardial infarction [facility]) 2016   "related to cocaine"  . Nonobstructive atherosclerosis of coronary artery 2016   LHC showed normal EF and mild non-obstructive CAD    Patient Active Problem List   Diagnosis Date Noted  . AKI (acute kidney injury) (HCC) 07/07/2017  . Ischemia, myocardial, acute (HCC) 07/06/2017  . Elevated troponin   . Cocaine adverse reaction 02/23/2017  . Chest pain 02/22/2017  . Mild CAD 02/22/2017  . NSTEMI (non-ST elevated myocardial infarction) (HCC) 05/15/2015  . Cocaine abuse (HCC) 05/15/2015  . Chest pain at rest   . Cardiac arrest (HCC) 05/10/2015  . Hyperlipidemia 05/10/2015  . Essential hypertension 01/10/2009  . SHOULDER PAIN, RIGHT 01/10/2009  . CERVICALGIA 01/10/2009  . LOW BACK PAIN 01/10/2009    Past Surgical History:  Procedure Laterality Date  . CARDIAC CATHETERIZATION N/A 05/10/2015   Procedure: Left Heart Cath and Coronary  Angiography;  Surgeon: Dolores Patty, MD;  Location: [facility] INVASIVE CV LAB;  Service: Cardiovascular;  Laterality: N/A;  . TONSILLECTOMY          Home Medications    Prior to Admission medications   Medication Sig Start Date End Date Taking? Authorizing Provider  amLODipine (NORVASC) 5 MG tablet Take 1 tablet (5 mg total) by mouth daily. 07/07/17  Yes Rama, Maryruth Bun, MD  aspirin EC 81 MG tablet Take 1 tablet (81 mg total) by mouth daily. 05/11/15  Yes Ghimire, Werner Lean, MD  isosorbide mononitrate (IMDUR) 30 MG 24 hr tablet Take 1 tablet (30 mg total) by mouth daily. 07/07/17  Yes Rama, Maryruth Bun, MD  losartan (COZAAR) 25 MG tablet Take 1 tablet (25 mg total) by mouth daily. 07/07/17  Yes Rama, Maryruth Bun, MD  nitroGLYCERIN (NITROSTAT) 0.4 MG SL tablet Place 1 tablet (0.4 mg total) under the tongue every 5 (five) minutes as needed for chest pain (for 3 doses). 07/07/17  Yes Rama, Maryruth Bun, MD  simvastatin (ZOCOR) 20 MG tablet Take 1 tablet (20 mg total) by mouth daily at 6 PM. 07/07/17  Yes Rama, Maryruth Bun, MD    Family History Family History  Problem Relation Age of Onset  . Hypertension Father   . Heart disease Father   . Heart disease Mother   . Heart disease Maternal Grandfather     Social History Social History   Tobacco Use  . Smoking status: Never Smoker  . Smokeless tobacco: Never  Used  Substance Use Topics  . Alcohol use: Yes    Alcohol/week: 7.8 oz    Types: 13 Cans of beer per week    Comment: 07/06/2017 "~ 2 40 oz beers 2x/week"  . Drug use: Yes    Types: Cocaine    Comment: 07/06/2017 "weekly"     Allergies   Propoxyphene n-acetaminophen   Review of Systems Review of Systems  Constitutional: Negative for chills and fever.  Musculoskeletal: Positive for back pain.  Neurological: Negative for weakness and numbness.       Negative for incontinence to bowel/bladder. Negative for saddle anesthesia.     Physical Exam Updated Vital Signs BP  118/75 (BP Location: Left Arm)   Pulse 98   Temp 97.9 F (36.6 C) (Oral)   Resp 17   Ht 5\' 10"  (1.778 m)   Wt 73.5 kg (162 lb)   SpO2 99%   BMI 23.24 kg/m   Physical Exam  Constitutional: He appears well-developed and well-nourished. No distress.  HENT:  Head: Normocephalic and atraumatic.  Eyes: Conjunctivae are normal. Right eye exhibits no discharge. Left eye exhibits no discharge.  Cardiovascular:  2+ symmetric PT pulses.  Abdominal: Soft. He exhibits no distension. There is no tenderness.  Musculoskeletal:  No obvious deformities, appreciable swelling, erythema, ecchymosis, or overlying warmth.  Back: Patient is diffusely tender about the lumbar region including midline and bilateral paraspinal muscle tenderness.  He has no point/focal vertebral tenderness to palpation.  Otherwise no musculoskeletal tenderness to palpation.  Neurological: He is alert.  Clear speech.  Patient has 5 out of 5 strength with hip flexion, knee flexion/extension, and ankle plantar/dorsiflexion.  Sensation is grossly intact bilateral lower extremities.  Patient has 2+ symmetric patellar DTRs.  His gait is somewhat antalgic but intact.  Psychiatric: He has a normal mood and affect. His behavior is normal. Thought content normal.  Nursing note and vitals reviewed.   ED Treatments / Results  Labs (all labs ordered are listed, but only abnormal results are displayed) Labs Reviewed - No data to display  EKG None  Radiology Dg Lumbar Spine Complete  Result Date: 09/30/2017 CLINICAL DATA:  Felt pull in low back while lifting heavy object. Low back pain. EXAM: LUMBAR SPINE - COMPLETE 4+ VIEW COMPARISON:  04/04/2009 FINDINGS: Degenerative disc disease at L4-5 and L5-S1 with disc space narrowing and spurring. Normal alignment. No fracture. Mild degenerative facet disease in the lumbar spine from L3-4 through L5-S1. IMPRESSION: Degenerative disc and facet disease in the lower lumbar spine. No acute bony  abnormality. Electronically Signed   By: Charlett Nose M.D.   On: 09/30/2017 10:40    Procedures Procedures (including critical care time)  Medications Ordered in ED Medications  oxyCODONE-acetaminophen (PERCOCET/ROXICET) 5-325 MG per tablet 1 tablet (1 tablet Oral Given 09/30/17 1015)     Initial Impression / Assessment and Plan / ED Course  I have reviewed the triage vital signs and the nursing notes.  Pertinent labs & imaging results that were available during my care of the patient were reviewed by me and considered in my medical decision making (see chart for details).   Patient presents status post lifting injury with complaints of diffuse lower back pain.  Patient is nontoxic-appearing, vitals WNL.  Pain improved following Percocet received by triage.  X-ray obtained per triage is negative for acute abnormality, consistent with degenerative changes.  Patient has a normal neurologic exam without deficits.  There is no point vertebral tenderness.  Given negative x-ray  with no focal tenderness do not suspect fracture or dislocation.  Patient can walk but states is painful.  No loss of bowel or bladder control. No saddle anesthesia.  No concern for cauda equina.  No fever, night sweats, weight loss, h/o cancer, or IVDU.  Will treat with Lidoderm patches and Robaxin, discussed with patient that they are not to drive or operate heavy machinery while taking Robaxin.  NSAIDs avoided given history of CAD.  I discussed treatment plan, need for PCP follow-up, and return precautions with the patient. Provided opportunity for questions, patient confirmed understanding and is in agreement with plan.   Final Clinical Impressions(s) / ED Diagnoses   Final diagnoses:  Acute bilateral low back pain without sciatica    ED Discharge Orders        Ordered    methocarbamol (ROBAXIN) 500 MG tablet  Every 8 hours PRN     09/30/17 1230    lidocaine (LIDODERM) 5 %  Every 24 hours     09/30/17 1230         Khiana Camino, [place] R, PA-C 09/30/17 1326    Rolan Bucco, MD 09/30/17 1517

## 2017-10-13 ENCOUNTER — Encounter (HOSPITAL_COMMUNITY): Payer: Self-pay | Admitting: Emergency Medicine

## 2017-10-13 ENCOUNTER — Other Ambulatory Visit: Payer: Self-pay

## 2017-10-13 ENCOUNTER — Ambulatory Visit (HOSPITAL_COMMUNITY)
Admission: EM | Admit: 2017-10-13 | Discharge: 2017-10-13 | Disposition: A | Discharge status: Home / Self Care | Payer: Self-pay | Attending: Emergency Medicine | Admitting: Emergency Medicine

## 2017-10-13 DIAGNOSIS — S81812A Laceration without foreign body, left lower leg, initial encounter: Secondary | ICD-10-CM

## 2017-10-13 NOTE — ED Triage Notes (Addendum)
Puncture wound to left lower leg .  Incident happened this morning.  Patient says leg punctured with rusty piece of metal, bleeding controlled, documentation reports last tetanus 12/2016  Cleansed wound.  A scrape above and below puncture wound, tissue exposed.  Placed dressing on site while waiting for a treatment room

## 2017-10-13 NOTE — Discharge Instructions (Signed)
Please return in 7 to 10 days to have sutures removed.  You have 5.  Please keep clean and dry, wash daily with warm soapy water and dry really well afterward.  You may apply antibiotic ointment occasionally.  Please return if you develop signs of infection including increased redness, swelling, drainage from wound or increased pain.

## 2017-10-13 NOTE — ED Notes (Signed)
Wound wrapped with bacitracin and gauze.

## 2017-10-14 NOTE — ED Provider Notes (Signed)
[facility]    CSN: [id number] Arrival date & time: 10/13/17  1015     History   Chief Complaint Chief Complaint  Patient presents with  . Laceration    HPI Bryce Ortega is a 57 y.o. male history of hypertension, hyper lipidemia presents today for evaluation of laceration to his left leg.  Patient states that he cut his calf on a rusty metal tool while he was at home.  Incident happened a couple hours ago earlier this morning.  Tetanus was last updated July 2018.  Denies numbness or tingling.  HPI  Past Medical History:  Diagnosis Date  . Cocaine abuse (HCC)   . H/O ventricular fibrillation 2016   s/p cocaine use  . High cholesterol   . Hypertension   . MI (mitral incompetence)   . Myocardial infarction [facility]) 2016   "related to cocaine"  . Nonobstructive atherosclerosis of coronary artery 2016   LHC showed normal EF and mild non-obstructive CAD    Patient Active Problem List   Diagnosis Date Noted  . AKI (acute kidney injury) (HCC) 07/07/2017  . Ischemia, myocardial, acute (HCC) 07/06/2017  . Elevated troponin   . Cocaine adverse reaction 02/23/2017  . Chest pain 02/22/2017  . Mild CAD 02/22/2017  . NSTEMI (non-ST elevated myocardial infarction) (HCC) 05/15/2015  . Cocaine abuse (HCC) 05/15/2015  . Chest pain at rest   . Cardiac arrest (HCC) 05/10/2015  . Hyperlipidemia 05/10/2015  . Essential hypertension 01/10/2009  . SHOULDER PAIN, RIGHT 01/10/2009  . CERVICALGIA 01/10/2009  . LOW BACK PAIN 01/10/2009    Past Surgical History:  Procedure Laterality Date  . CARDIAC CATHETERIZATION N/A 05/10/2015   Procedure: Left Heart Cath and Coronary Angiography;  Surgeon: Dolores Patty, MD;  Location: [facility] INVASIVE CV LAB;  Service: Cardiovascular;  Laterality: N/A;  . TONSILLECTOMY         Home Medications    Prior to Admission medications   Medication Sig Start Date End Date Taking? Authorizing Provider  amLODipine (NORVASC) 5 MG tablet Take  1 tablet (5 mg total) by mouth daily. 07/07/17   Rama, Maryruth Bun, MD  aspirin EC 81 MG tablet Take 1 tablet (81 mg total) by mouth daily. 05/11/15   Ghimire, Werner Lean, MD  isosorbide mononitrate (IMDUR) 30 MG 24 hr tablet Take 1 tablet (30 mg total) by mouth daily. 07/07/17   Rama, Maryruth Bun, MD  lidocaine (LIDODERM) 5 % Place 1 patch onto the skin daily. Remove & Discard patch within 12 hours or as directed by MD 09/30/17   Petrucelli, Lelon Mast R, PA-C  losartan (COZAAR) 25 MG tablet Take 1 tablet (25 mg total) by mouth daily. 07/07/17   Rama, Maryruth Bun, MD  methocarbamol (ROBAXIN) 500 MG tablet Take 1 tablet (500 mg total) by mouth every 8 (eight) hours as needed. 09/30/17   Petrucelli, Samantha R, PA-C  nitroGLYCERIN (NITROSTAT) 0.4 MG SL tablet Place 1 tablet (0.4 mg total) under the tongue every 5 (five) minutes as needed for chest pain (for 3 doses). 07/07/17   Rama, Maryruth Bun, MD  simvastatin (ZOCOR) 20 MG tablet Take 1 tablet (20 mg total) by mouth daily at 6 PM. 07/07/17   Rama, Maryruth Bun, MD    Family History Family History  Problem Relation Age of Onset  . Hypertension Father   . Heart disease Father   . Heart disease Mother   . Heart disease Maternal Grandfather     Social History Social History  Tobacco Use  . Smoking status: Never Smoker  . Smokeless tobacco: Never Used  Substance Use Topics  . Alcohol use: Yes    Alcohol/week: 7.8 oz    Types: 13 Cans of beer per week    Comment: 07/06/2017 "~ 2 40 oz beers 2x/week"  . Drug use: Not Currently    Types: Cocaine    Comment: 07/06/2017 "weekly"     Allergies   Propoxyphene n-acetaminophen   Review of Systems Review of Systems  Constitutional: Negative for fatigue and fever.  Respiratory: Negative for shortness of breath.   Cardiovascular: Negative for chest pain.  Gastrointestinal: Negative for nausea and vomiting.  Musculoskeletal: Positive for myalgias.  Skin: Positive for color change and wound.    Neurological: Negative for dizziness, weakness, light-headedness, numbness and headaches.     Physical Exam Triage Vital Signs ED Triage Vitals  Enc Vitals Group     BP 10/13/17 1055 100/62     Pulse Rate 10/13/17 1055 83     Resp 10/13/17 1055 18     Temp 10/13/17 1055 98 F (36.7 C)     Temp Source 10/13/17 1055 Oral     SpO2 10/13/17 1055 97 %     Weight --      Height --      Head Circumference --      Peak Flow --      Pain Score 10/13/17 1052 6     Pain Loc --      Pain Edu? --      Excl. in GC? --    No data found.  Updated Vital Signs BP 100/62 (BP Location: Left Arm)   Pulse 83   Temp 98 F (36.7 C) (Oral)   Resp 18   SpO2 97%   Visual Acuity Right Eye Distance:   Left Eye Distance:   Bilateral Distance:    Right Eye Near:   Left Eye Near:    Bilateral Near:     Physical Exam  Constitutional: He appears well-developed and well-nourished.  HENT:  Head: Normocephalic and atraumatic.  Eyes: Conjunctivae are normal.  Neck: Neck supple.  Cardiovascular: Normal rate.  Pulmonary/Chest: Effort normal. No respiratory distress.  Musculoskeletal: He exhibits no edema.  Neurological: He is alert.  Skin: Skin is warm and dry.  3 cm linear laceration to posterior calf, and is approximately 1 cm wide with subcutaneous fat exposed.  Minimal erythema surrounding wound.  Psychiatric: He has a normal mood and affect.  Nursing note and vitals reviewed.    UC Treatments / Results  Labs (all labs ordered are listed, but only abnormal results are displayed) Labs Reviewed - No data to display  EKG None  Radiology No results found.  Procedures Laceration Repair Date/Time: 10/14/2017 9:56 AM Performed by: [place] Lesure, [place] C, PA-C Authorized by: Kristen Bushway, Junius Creamer, PA-C   Consent:    Consent obtained:  Verbal   Consent given by:  Patient   Risks discussed:  Infection and pain   Alternatives discussed:  No treatment Anesthesia (see MAR for exact dosages):     Anesthesia method:  Local infiltration   Local anesthetic:  Lidocaine 2% WITH epi Laceration details:    Location:  Leg   Leg location:  L lower leg   Length (cm):  3   Depth (mm):  0.5 Repair type:    Repair type:  Simple Pre-procedure details:    Preparation:  Patient was prepped and draped in usual sterile fashion Exploration:  Hemostasis achieved with:  Direct pressure   Wound exploration: wound explored through full range of motion     Wound extent: no foreign bodies/material noted, no muscle damage noted, no underlying fracture noted and no vascular damage noted     Contaminated: no   Treatment:    Area cleansed with:  Shur-Clens   Amount of cleaning:  Standard   Irrigation solution:  Tap water   Irrigation method:  Syringe   Visualized foreign bodies/material removed: no   Skin repair:    Repair method:  Sutures   Suture size:  5-0   Suture material:  Prolene   Suture technique:  Simple interrupted   Number of sutures:  5 Approximation:    Approximation:  Close Post-procedure details:    Dressing:  Antibiotic ointment   (including critical care time)  Medications Ordered in UC Medications - No data to display  Initial Impression / Assessment and Plan / UC Course  I have reviewed the triage vital signs and the nursing notes.  Pertinent labs & imaging results that were available during my care of the patient were reviewed by me and considered in my medical decision making (see chart for details).     Laceration performed, tetanus up-to-date.  Discussed signs of infection to return.  Discussed wound care. Discussed strict return precautions. Patient verbalized understanding and is agreeable with plan.  Final Clinical Impressions(s) / UC Diagnoses   Final diagnoses:  Laceration of left lower extremity, initial encounter     Discharge Instructions     Please return in 7 to 10 days to have sutures removed.  You have 5.  Please keep clean and dry, wash  daily with warm soapy water and dry really well afterward.  You may apply antibiotic ointment occasionally.  Please return if you develop signs of infection including increased redness, swelling, drainage from wound or increased pain.   ED Prescriptions    None     Controlled Substance Prescriptions [place] Controlled Substance Registry consulted? Not Applicable   Lew Dawes, [place] 10/14/17 [phone]

## 2017-10-24 ENCOUNTER — Ambulatory Visit (HOSPITAL_COMMUNITY)
Admission: EM | Admit: 2017-10-24 | Discharge: 2017-10-24 | Disposition: A | Discharge status: Home / Self Care | Payer: Self-pay

## 2017-10-24 ENCOUNTER — Encounter (HOSPITAL_COMMUNITY): Payer: Self-pay | Admitting: Emergency Medicine

## 2017-10-24 DIAGNOSIS — S81812D Laceration without foreign body, left lower leg, subsequent encounter: Secondary | ICD-10-CM

## 2017-10-24 DIAGNOSIS — Z4802 Encounter for removal of sutures: Secondary | ICD-10-CM

## 2017-10-24 NOTE — ED Triage Notes (Signed)
Pt here for suture removal; 5 sutures removed from left lower leg

## 2017-12-20 ENCOUNTER — Emergency Department (HOSPITAL_COMMUNITY)
Admission: EM | Admit: 2017-12-20 | Discharge: 2017-12-20 | Disposition: A | Discharge status: Home / Self Care | Payer: Self-pay | Attending: Emergency Medicine | Admitting: Emergency Medicine

## 2017-12-20 ENCOUNTER — Emergency Department (HOSPITAL_COMMUNITY): Payer: Self-pay

## 2017-12-20 ENCOUNTER — Encounter (HOSPITAL_COMMUNITY): Payer: Self-pay | Admitting: Emergency Medicine

## 2017-12-20 DIAGNOSIS — Z7982 Long term (current) use of aspirin: Secondary | ICD-10-CM | POA: Insufficient documentation

## 2017-12-20 DIAGNOSIS — I10 Essential (primary) hypertension: Secondary | ICD-10-CM | POA: Insufficient documentation

## 2017-12-20 DIAGNOSIS — Z79899 Other long term (current) drug therapy: Secondary | ICD-10-CM | POA: Insufficient documentation

## 2017-12-20 DIAGNOSIS — R0789 Other chest pain: Secondary | ICD-10-CM | POA: Insufficient documentation

## 2017-12-20 DIAGNOSIS — E785 Hyperlipidemia, unspecified: Secondary | ICD-10-CM | POA: Insufficient documentation

## 2017-12-20 DIAGNOSIS — I252 Old myocardial infarction: Secondary | ICD-10-CM | POA: Insufficient documentation

## 2017-12-20 DIAGNOSIS — E7800 Pure hypercholesterolemia, unspecified: Secondary | ICD-10-CM | POA: Insufficient documentation

## 2017-12-20 LAB — CBC
HCT: 47.8 % (ref 39.0–52.0)
Hemoglobin: 15.4 g/dL (ref 13.0–17.0)
MCH: 29.7 pg (ref 26.0–34.0)
MCHC: 32.2 g/dL (ref 30.0–36.0)
MCV: 92.1 fL (ref 78.0–100.0)
Platelets: 299 10*3/uL (ref 150–400)
RBC: 5.19 MIL/uL (ref 4.22–5.81)
RDW: 13.2 % (ref 11.5–15.5)
WBC: 7.3 10*3/uL (ref 4.0–10.5)

## 2017-12-20 LAB — BASIC METABOLIC PANEL
Anion gap: 12 (ref 5–15)
BUN: 19 mg/dL (ref 6–20)
CO2: 23 mmol/L (ref 22–32)
CREATININE: 1.08 mg/dL (ref 0.61–1.24)
Calcium: 8.7 mg/dL — ABNORMAL LOW (ref 8.9–10.3)
Chloride: 105 mmol/L (ref 98–111)
GFR calc Af Amer: >60 mL/min (ref >60)
Glucose, Bld: 144 mg/dL — ABNORMAL HIGH (ref 70–99)
Potassium: 3.7 mmol/L (ref 3.5–5.1)
SODIUM: 140 mmol/L (ref 135–145)

## 2017-12-20 LAB — I-STAT TROPONIN, ED
TROPONIN I, POC: 0.01 ng/mL (ref 0.00–0.08)
Troponin i, poc: 0 ng/mL (ref 0.00–0.08)

## 2017-12-20 MED ORDER — NITROGLYCERIN 0.4 MG SL SUBL
0.4000 mg | SUBLINGUAL_TABLET | Freq: Once | SUBLINGUAL | Status: AC
  Administered 2017-12-20: 0.4 mg via SUBLINGUAL
  Filled 2017-12-20: qty 1

## 2017-12-20 NOTE — Discharge Instructions (Signed)
It is very important that you follow-up with cardiology for further evaluation and management of your symptoms. Use Tylenol or ibuprofen as needed for pain. Make sure you are staying well-hydrated with water. Return to the emergency room if you develop difficulty breathing, new or worsening chest pain, or any new or concerning symptoms.

## 2017-12-20 NOTE — ED Triage Notes (Signed)
Brought by ems from home for c/o central chest pain.  Describes as sharp. Woke him up this morning.  Usually uses Ntg but did not have any.  Given Ntg SL X 1 and asa 324mg  via ems.  Endorses history of cocaine use.  Last use a week ago.

## 2017-12-20 NOTE — ED Provider Notes (Signed)
[facility] EMERGENCY DEPARTMENT Provider Note   CSN: [id number] Arrival date & time: 12/20/17  0220     History   Chief Complaint Chief Complaint  Patient presents with  . Chest Pain    HPI Bryce Ortega is a 57 y.o. male presenting for evaluation of chest pain.  Patient states he was awakened from sleep around 2:00 this morning with central chest pain.  It has been constant since.  He states that this happens intermittently, and normally resolves with nitro, however he did not have any more nitro at home.  He was given nitro and aspirin with EMS, reports improvement but not resolution of the pain.  He denies associated shortness of breath, nausea, vomiting, or diaphoresis.  He denies change in activity or increased activity yesterday.  He denies fevers, chills, cough, abdominal pain, urinary symptoms, abnormal bowel movements.  If he has a history of high blood pressure for which he takes medication, denies other medical problems.  He states he does not see a cardiologist, nitro as prescribed by the health department.  HPI  Past Medical History:  Diagnosis Date  . Cocaine abuse (HCC)   . H/O ventricular fibrillation 2016   s/p cocaine use  . High cholesterol   . Hypertension   . MI (mitral incompetence)   . Myocardial infarction [facility]) 2016   "related to cocaine"  . Nonobstructive atherosclerosis of coronary artery 2016   LHC showed normal EF and mild non-obstructive CAD    Patient Active Problem List   Diagnosis Date Noted  . AKI (acute kidney injury) (HCC) 07/07/2017  . Ischemia, myocardial, acute (HCC) 07/06/2017  . Elevated troponin   . Cocaine adverse reaction 02/23/2017  . Chest pain 02/22/2017  . Mild CAD 02/22/2017  . NSTEMI (non-ST elevated myocardial infarction) (HCC) 05/15/2015  . Cocaine abuse (HCC) 05/15/2015  . Chest pain at rest   . Cardiac arrest (HCC) 05/10/2015  . Hyperlipidemia 05/10/2015  . Essential hypertension 01/10/2009    . SHOULDER PAIN, RIGHT 01/10/2009  . CERVICALGIA 01/10/2009  . LOW BACK PAIN 01/10/2009    Past Surgical History:  Procedure Laterality Date  . CARDIAC CATHETERIZATION N/A 05/10/2015   Procedure: Left Heart Cath and Coronary Angiography;  Surgeon: Dolores Patty, MD;  Location: [facility] INVASIVE CV LAB;  Service: Cardiovascular;  Laterality: N/A;  . TONSILLECTOMY          Home Medications    Prior to Admission medications   Medication Sig Start Date End Date Taking? Authorizing Provider  amLODipine (NORVASC) 5 MG tablet Take 1 tablet (5 mg total) by mouth daily. 07/07/17  Yes Rama, Maryruth Bun, MD  aspirin EC 81 MG tablet Take 1 tablet (81 mg total) by mouth daily. 05/11/15  Yes Ghimire, Werner Lean, MD  isosorbide mononitrate (IMDUR) 30 MG 24 hr tablet Take 1 tablet (30 mg total) by mouth daily. 07/07/17  Yes Rama, Maryruth Bun, MD  losartan (COZAAR) 25 MG tablet Take 1 tablet (25 mg total) by mouth daily. 07/07/17  Yes Rama, Maryruth Bun, MD  methocarbamol (ROBAXIN) 500 MG tablet Take 1 tablet (500 mg total) by mouth every 8 (eight) hours as needed. Patient taking differently: Take 500 mg by mouth every 8 (eight) hours as needed for muscle spasms.  09/30/17  Yes Petrucelli, Samantha R, PA-C  nitroGLYCERIN (NITROSTAT) 0.4 MG SL tablet Place 1 tablet (0.4 mg total) under the tongue every 5 (five) minutes as needed for chest pain (for 3 doses). 07/07/17  Yes Rama, Maryruth Bun, MD  simvastatin (ZOCOR) 20 MG tablet Take 1 tablet (20 mg total) by mouth daily at 6 PM. 07/07/17  Yes Rama, Maryruth Bun, MD  lidocaine (LIDODERM) 5 % Place 1 patch onto the skin daily. Remove & Discard patch within 12 hours or as directed by MD Patient not taking: Reported on 12/20/2017 09/30/17   Petrucelli, Pleas Koch, PA-C    Family History Family History  Problem Relation Age of Onset  . Hypertension Father   . Heart disease Father   . Heart disease Mother   . Heart disease Maternal Grandfather     Social  History Social History   Tobacco Use  . Smoking status: Never Smoker  . Smokeless tobacco: Never Used  Substance Use Topics  . Alcohol use: Yes    Alcohol/week: 7.8 oz    Types: 13 Cans of beer per week    Comment: 07/06/2017 "~ 2 40 oz beers 2x/week"  . Drug use: Not Currently    Types: Cocaine    Comment: 07/06/2017 "weekly"     Allergies   Propoxyphene n-acetaminophen   Review of Systems Review of Systems  Cardiovascular: Positive for chest pain.  All other systems reviewed and are negative.    Physical Exam Updated Vital Signs BP 110/78   Pulse 63   Temp 98.1 F (36.7 C) (Oral)   Resp 14   Ht 5\' 10"  (1.778 m)   Wt 73.5 kg (162 lb)   SpO2 96%   BMI 23.24 kg/m   Physical Exam  Constitutional: He is oriented to person, place, and time. He appears well-developed and well-nourished. No distress.  Appears in no distress  HENT:  Head: Normocephalic and atraumatic.  Eyes: Pupils are equal, round, and reactive to light. Conjunctivae and EOM are normal.  Neck: Normal range of motion. Neck supple.  Cardiovascular: Normal rate, regular rhythm and intact distal pulses.  Pulmonary/Chest: Effort normal and breath sounds normal. No respiratory distress. He has no wheezes. He exhibits tenderness.  Tenderness to palpation of anterior chest wall.  Clear lung sounds in all fields.  Speaking in full sentences.  Abdominal: Soft. He exhibits no distension and no mass. There is no tenderness. There is no guarding.  Musculoskeletal: Normal range of motion. He exhibits no edema or tenderness.  No leg pain or swelling.   Neurological: He is alert and oriented to person, place, and time.  Skin: Skin is warm and dry.  Psychiatric: He has a normal mood and affect.  Nursing note and vitals reviewed.   ED Treatments / Results  Labs (all labs ordered are listed, but only abnormal results are displayed) Labs Reviewed  BASIC METABOLIC PANEL - Abnormal; Notable for the following  components:      Result Value   Glucose, Bld 144 (*)    Calcium 8.7 (*)    All other components within normal limits  CBC  I-STAT TROPONIN, ED  I-STAT TROPONIN, ED    EKG EKG Interpretation  Date/Time:  Monday December 20 2017 02:26:52 EDT Ventricular Rate:  74 PR Interval:  160 QRS Duration: 84 QT Interval:  370 QTC Calculation: 410 R Axis:   60 Text Interpretation:  Normal sinus rhythm Confirmed by Nicanor Alcon, April ([id number]) on 12/20/2017 5:33:19 AM   Radiology Dg Chest 2 View  Result Date: 12/20/2017 CLINICAL DATA:  Mid chest pain for 1 hour. EXAM: CHEST - 2 VIEW COMPARISON:  05/17/2017 FINDINGS: The cardiomediastinal contours are normal. The lungs are clear. Pulmonary vasculature  is normal. No consolidation, pleural effusion, or pneumothorax. No acute osseous abnormalities are seen. IMPRESSION: No acute pulmonary process. Electronically Signed   By: Rubye Oaks M.D.   On: 12/20/2017 02:53    Procedures Procedures (including critical care time)  Medications Ordered in ED Medications  nitroGLYCERIN (NITROSTAT) SL tablet 0.4 mg (0.4 mg Sublingual Given 12/20/17 2956)     Initial Impression / Assessment and Plan / ED Course  I have reviewed the triage vital signs and the nursing notes.  Pertinent labs & imaging results that were available during my care of the patient were reviewed by me and considered in my medical decision making (see chart for details).     Patient presenting for evaluation of chest pain.  Physical exam reassuring, he is afebrile not tachycardic.  Appears nontoxic.  History of cocaine related MI/NSTEMI.  On exam, chest pain is reproducible with palpation of the chest wall.  Initial lab work reassuring, initial troponin negative.  EKG without STEMI.  Chest x-ray viewed interpreted by me, no pneumonia, pneumothorax, effusions.  Pain improved but not resolved with nitro. Heart score 2.  Delta troponin and reassess. Will give another SL nitro.   Repeat troponin  negative.  Patient reports improved pain with second nitro.  Discussed importance of follow-up with cardiology and cessation of cocaine.  At this time, doubt ACS, PE, infection, or emergent cardiopulmonary process.  Discussed with patient.  At this time, patient appears safe for discharge.  Return precautions given.  Patient states he understands and agrees to plan.   Final Clinical Impressions(s) / ED Diagnoses   Final diagnoses:  Atypical chest pain    ED Discharge Orders    None       Alveria Apley, PA-C 12/20/17 0655    Palumbo, April, MD 12/20/17 [id number]

## 2017-12-20 NOTE — ED Notes (Addendum)
Patient verbalizes understanding of discharge instructions. VSS and ambulatory at discharge.

## 2017-12-25 ENCOUNTER — Emergency Department (HOSPITAL_COMMUNITY): Payer: Self-pay

## 2017-12-25 ENCOUNTER — Other Ambulatory Visit: Payer: Self-pay

## 2017-12-25 ENCOUNTER — Encounter (HOSPITAL_COMMUNITY): Payer: Self-pay | Admitting: Emergency Medicine

## 2017-12-25 ENCOUNTER — Inpatient Hospital Stay (HOSPITAL_COMMUNITY)
Admission: EM | Admit: 2017-12-25 | Discharge: 2017-12-28 | DRG: 282 | Disposition: A | Discharge status: Home / Self Care | Payer: Self-pay | Attending: Internal Medicine | Admitting: Internal Medicine

## 2017-12-25 DIAGNOSIS — Z7982 Long term (current) use of aspirin: Secondary | ICD-10-CM

## 2017-12-25 DIAGNOSIS — R072 Precordial pain: Secondary | ICD-10-CM | POA: Diagnosis present

## 2017-12-25 DIAGNOSIS — R079 Chest pain, unspecified: Secondary | ICD-10-CM

## 2017-12-25 DIAGNOSIS — F14188 Cocaine abuse with other cocaine-induced disorder: Secondary | ICD-10-CM | POA: Diagnosis present

## 2017-12-25 DIAGNOSIS — Z9119 Patient's noncompliance with other medical treatment and regimen: Secondary | ICD-10-CM

## 2017-12-25 DIAGNOSIS — Z79899 Other long term (current) drug therapy: Secondary | ICD-10-CM

## 2017-12-25 DIAGNOSIS — I214 Non-ST elevation (NSTEMI) myocardial infarction: Principal | ICD-10-CM | POA: Diagnosis present

## 2017-12-25 DIAGNOSIS — I340 Nonrheumatic mitral (valve) insufficiency: Secondary | ICD-10-CM | POA: Diagnosis present

## 2017-12-25 DIAGNOSIS — F1410 Cocaine abuse, uncomplicated: Secondary | ICD-10-CM | POA: Diagnosis present

## 2017-12-25 DIAGNOSIS — E785 Hyperlipidemia, unspecified: Secondary | ICD-10-CM | POA: Diagnosis present

## 2017-12-25 DIAGNOSIS — Z885 Allergy status to narcotic agent status: Secondary | ICD-10-CM

## 2017-12-25 DIAGNOSIS — I25111 Atherosclerotic heart disease of native coronary artery with angina pectoris with documented spasm: Secondary | ICD-10-CM | POA: Diagnosis present

## 2017-12-25 DIAGNOSIS — I259 Chronic ischemic heart disease, unspecified: Secondary | ICD-10-CM

## 2017-12-25 DIAGNOSIS — I252 Old myocardial infarction: Secondary | ICD-10-CM

## 2017-12-25 DIAGNOSIS — Z8674 Personal history of sudden cardiac arrest: Secondary | ICD-10-CM

## 2017-12-25 DIAGNOSIS — I10 Essential (primary) hypertension: Secondary | ICD-10-CM | POA: Diagnosis present

## 2017-12-25 LAB — CBC
HCT: 53.5 % — ABNORMAL HIGH (ref 39.0–52.0)
HCT: 48.3 % (ref 39.0–52.0)
HEMOGLOBIN: 16.1 g/dL (ref 13.0–17.0)
Hemoglobin: 17.5 g/dL — ABNORMAL HIGH (ref 13.0–17.0)
MCH: 29.9 pg (ref 26.0–34.0)
MCH: 30.1 pg (ref 26.0–34.0)
MCHC: 32.7 g/dL (ref 30.0–36.0)
MCHC: 33.3 g/dL (ref 30.0–36.0)
MCV: 91.3 fL (ref 78.0–100.0)
MCV: 90.4 fL (ref 78.0–100.0)
PLATELETS: 299 10*3/uL (ref 150–400)
Platelets: 353 10*3/uL (ref 150–400)
RBC: 5.86 MIL/uL — ABNORMAL HIGH (ref 4.22–5.81)
RBC: 5.34 MIL/uL (ref 4.22–5.81)
RDW: 13.2 % (ref 11.5–15.5)
RDW: 13.2 % (ref 11.5–15.5)
WBC: 9.7 10*3/uL (ref 4.0–10.5)
WBC: 10.3 10*3/uL (ref 4.0–10.5)

## 2017-12-25 LAB — BASIC METABOLIC PANEL
Anion gap: 10 (ref 5–15)
BUN: 17 mg/dL (ref 6–20)
CHLORIDE: 107 mmol/L (ref 98–111)
CO2: 23 mmol/L (ref 22–32)
Calcium: 8.3 mg/dL — ABNORMAL LOW (ref 8.9–10.3)
Creatinine, Ser: 1.2 mg/dL (ref 0.61–1.24)
GFR calc Af Amer: >60 mL/min (ref >60)
GFR calc non Af Amer: >60 mL/min (ref >60)
GLUCOSE: 106 mg/dL — AB (ref 70–99)
Potassium: 4.2 mmol/L (ref 3.5–5.1)
Sodium: 140 mmol/L (ref 135–145)

## 2017-12-25 LAB — CREATININE, SERUM
CREATININE: 1.12 mg/dL (ref 0.61–1.24)
GFR calc Af Amer: >60 mL/min (ref >60)

## 2017-12-25 LAB — RAPID URINE DRUG SCREEN, HOSP PERFORMED
AMPHETAMINES: NOT DETECTED
Benzodiazepines: NOT DETECTED
Cocaine: POSITIVE — AB
OPIATES: NOT DETECTED
TETRAHYDROCANNABINOL: NOT DETECTED

## 2017-12-25 LAB — TROPONIN I
TROPONIN I: 1.96 ng/mL — AB (ref <0.03)
Troponin I: <0.03 ng/mL (ref <0.03)
Troponin I: 16.14 ng/mL (ref <0.03)

## 2017-12-25 LAB — I-STAT TROPONIN, ED: TROPONIN I, POC: 0 ng/mL (ref 0.00–0.08)

## 2017-12-25 LAB — MAGNESIUM: Magnesium: 2.2 mg/dL (ref 1.7–2.4)

## 2017-12-25 LAB — BRAIN NATRIURETIC PEPTIDE: B Natriuretic Peptide: 11.3 pg/mL (ref 0.0–100.0)

## 2017-12-25 MED ORDER — ONDANSETRON HCL 4 MG/2ML IJ SOLN: 4.0000 mg | Freq: Four times a day (QID) | INTRAMUSCULAR | Status: DC | PRN

## 2017-12-25 MED ORDER — KETOROLAC TROMETHAMINE 30 MG/ML IJ SOLN: 30.0000 mg | Freq: Four times a day (QID) | INTRAMUSCULAR | Status: DC | PRN

## 2017-12-25 MED ORDER — ALPRAZOLAM 0.25 MG PO TABS: 0.2500 mg | ORAL_TABLET | Freq: Two times a day (BID) | ORAL | Status: DC | PRN

## 2017-12-25 MED ORDER — ACETAMINOPHEN 325 MG PO TABS: 650.0000 mg | ORAL_TABLET | ORAL | Status: DC | PRN

## 2017-12-25 MED ORDER — ASPIRIN 81 MG PO CHEW
324.0000 mg | CHEWABLE_TABLET | Freq: Once | ORAL | Status: AC
  Administered 2017-12-25: 324 mg via ORAL
  Filled 2017-12-25: qty 4

## 2017-12-25 MED ORDER — LIDOCAINE 5 % EX PTCH
1.0000 | MEDICATED_PATCH | CUTANEOUS | Status: DC
  Filled 2017-12-25 (×3): qty 1

## 2017-12-25 MED ORDER — NITROGLYCERIN IN D5W 200-5 MCG/ML-% IV SOLN
0.0000 ug/min | Freq: Once | INTRAVENOUS | Status: AC
  Administered 2017-12-25: 5 ug/min via INTRAVENOUS
  Filled 2017-12-25: qty 250

## 2017-12-25 MED ORDER — GI COCKTAIL ~~LOC~~: 30.0000 mL | Freq: Four times a day (QID) | ORAL | Status: DC | PRN

## 2017-12-25 MED ORDER — AMLODIPINE BESYLATE 5 MG PO TABS
5.0000 mg | ORAL_TABLET | Freq: Every day | ORAL | Status: DC
  Administered 2017-12-25 – 2017-12-28 (×4): 5 mg via ORAL
  Filled 2017-12-25 (×4): qty 1

## 2017-12-25 MED ORDER — LORAZEPAM 2 MG/ML IJ SOLN
1.0000 mg | Freq: Once | INTRAMUSCULAR | Status: AC
  Administered 2017-12-25: 1 mg via INTRAVENOUS
  Filled 2017-12-25: qty 1

## 2017-12-25 MED ORDER — HEPARIN (PORCINE) IN NACL 100-0.45 UNIT/ML-% IJ SOLN
1200.0000 [IU]/h | INTRAMUSCULAR | Status: DC
  Administered 2017-12-25: 850 [IU]/h via INTRAVENOUS
  Administered 2017-12-26: 1200 [IU]/h via INTRAVENOUS
  Filled 2017-12-25 (×2): qty 250

## 2017-12-25 MED ORDER — ENOXAPARIN SODIUM 40 MG/0.4ML ~~LOC~~ SOLN
40.0000 mg | SUBCUTANEOUS | Status: DC
  Administered 2017-12-25: 40 mg via SUBCUTANEOUS
  Filled 2017-12-25: qty 0.4

## 2017-12-25 MED ORDER — ISOSORBIDE MONONITRATE ER 30 MG PO TB24
30.0000 mg | ORAL_TABLET | Freq: Every day | ORAL | Status: DC
  Administered 2017-12-25: 30 mg via ORAL
  Filled 2017-12-25 (×2): qty 1

## 2017-12-25 MED ORDER — ENOXAPARIN SODIUM 80 MG/0.8ML ~~LOC~~ SOLN: 1.0000 mg/kg | Freq: Two times a day (BID) | SUBCUTANEOUS | Status: DC

## 2017-12-25 MED ORDER — LOSARTAN POTASSIUM 25 MG PO TABS
25.0000 mg | ORAL_TABLET | Freq: Every day | ORAL | Status: DC
  Administered 2017-12-25 – 2017-12-28 (×4): 25 mg via ORAL
  Filled 2017-12-25 (×4): qty 1

## 2017-12-25 MED ORDER — POTASSIUM CHLORIDE IN NACL 20-0.9 MEQ/L-% IV SOLN
INTRAVENOUS | Status: AC
  Administered 2017-12-25: 18:00:00 via INTRAVENOUS
  Filled 2017-12-25 (×4): qty 1000

## 2017-12-25 MED ORDER — HEPARIN BOLUS VIA INFUSION
2000.0000 [IU] | Freq: Once | INTRAVENOUS | Status: AC
  Administered 2017-12-25: 2000 [IU] via INTRAVENOUS
  Filled 2017-12-25: qty 2000

## 2017-12-25 MED ORDER — KETOROLAC TROMETHAMINE 15 MG/ML IJ SOLN: 15.0000 mg | Freq: Four times a day (QID) | INTRAMUSCULAR | Status: DC | PRN

## 2017-12-25 MED ORDER — HEPARIN BOLUS VIA INFUSION
3500.0000 [IU] | Freq: Once | INTRAVENOUS | Status: DC
Start: 2017-12-25 — End: 2017-12-25
  Filled 2017-12-25: qty 3500

## 2017-12-25 MED ORDER — NITROGLYCERIN 0.4 MG SL SUBL
0.4000 mg | SUBLINGUAL_TABLET | SUBLINGUAL | Status: DC | PRN
  Administered 2017-12-25 (×2): 0.4 mg via SUBLINGUAL
  Filled 2017-12-25: qty 1

## 2017-12-25 MED ORDER — NITROGLYCERIN IN D5W 200-5 MCG/ML-% IV SOLN
0.0000 ug/min | INTRAVENOUS | Status: DC
Start: 2017-12-25 — End: 2017-12-27
  Administered 2017-12-25: 5 ug/min via INTRAVENOUS

## 2017-12-25 MED ORDER — ZOLPIDEM TARTRATE 5 MG PO TABS: 5.0000 mg | ORAL_TABLET | Freq: Every evening | ORAL | Status: DC | PRN

## 2017-12-25 MED ORDER — ASPIRIN EC 81 MG PO TBEC
81.0000 mg | DELAYED_RELEASE_TABLET | Freq: Every day | ORAL | Status: DC
  Administered 2017-12-25 – 2017-12-27 (×3): 81 mg via ORAL
  Filled 2017-12-25 (×3): qty 1

## 2017-12-25 MED ORDER — SIMVASTATIN 20 MG PO TABS
20.0000 mg | ORAL_TABLET | Freq: Every day | ORAL | Status: DC
  Administered 2017-12-25: 20 mg via ORAL
  Filled 2017-12-25: qty 1

## 2017-12-25 NOTE — Progress Notes (Addendum)
ANTICOAGULATION CONSULT NOTE - Initial Consult  Pharmacy Consult for heparin Indication: chest pain/ACS  Allergies  Allergen Reactions  . Propoxyphene N-Acetaminophen Nausea Only    REACTION: GI upset    Patient Measurements: Height: 5\' 10"  (177.8 cm) Weight: 156 lb 3.2 oz (70.9 kg) IBW/kg (Calculated) : 73 Heparin Dosing Weight: 70.9  Vital Signs: Temp: 98.7 F (37.1 C) (07/13 1931) Temp Source: Oral (07/13 1931) BP: 105/60 (07/13 1931) Pulse Rate: 72 (07/13 1931)  Labs: Recent Labs    12/25/17 0839 12/25/17 1412 12/25/17 2021  HGB 17.5* 16.1  --   HCT 53.5* 48.3  --   PLT 353 299  --   CREATININE 1.20 1.12  --   TROPONINI <0.03 1.96* 16.14*    Estimated Creatinine Clearance: 73.9 mL/min (by C-G formula based on SCr of 1.12 mg/dL).   Medical History: Past Medical History:  Diagnosis Date  . Cocaine abuse (HCC)   . H/O ventricular fibrillation 2016   s/p cocaine use  . High cholesterol   . Hypertension   . MI (mitral incompetence)   . Myocardial infarction [facility]) 2016   "related to cocaine"  . Nonobstructive atherosclerosis of coronary artery 2016   LHC showed normal EF and mild non-obstructive CAD    Medications:  Medications Prior to Admission  Medication Sig Dispense Refill Last Dose  . amLODipine (NORVASC) 5 MG tablet Take 1 tablet (5 mg total) by mouth daily. 30 tablet 3 12/23/2017 at Unknown time  . aspirin EC 81 MG tablet Take 1 tablet (81 mg total) by mouth daily. 30 tablet 0 12/24/2017 at Unknown time  . isosorbide mononitrate (IMDUR) 30 MG 24 hr tablet Take 1 tablet (30 mg total) by mouth daily. 30 tablet 2 12/24/2017 at Unknown time  . lidocaine (LIDODERM) 5 % Place 1 patch onto the skin daily. Remove & Discard patch within 12 hours or as directed by MD 30 patch 0 Past Week at Unknown time  . losartan (COZAAR) 25 MG tablet Take 1 tablet (25 mg total) by mouth daily. 30 tablet 2 12/24/2017 at Unknown time  . nitroGLYCERIN (NITROSTAT) 0.4 MG SL  tablet Place 1 tablet (0.4 mg total) under the tongue every 5 (five) minutes as needed for chest pain (for 3 doses). 30 tablet 0 ukn  . simvastatin (ZOCOR) 20 MG tablet Take 1 tablet (20 mg total) by mouth daily at 6 PM. 30 tablet 2 12/23/2017    Assessment: 57 yo man to start heparin for CP.  He was not on anticoagulation PTA.  Lovenox 40 mg sq given earlier today Goal of Therapy:  Heparin level 0.3-0.7 units/ml Monitor platelets by anticoagulation protocol: Yes   Plan:  Heparin 2000 unit bolus and drip at 850 units/hr Check heparin level 6-8 hours after start Daily HL and CBC while on heparin Monitor for bleeding complications  Bryce Ortega 12/25/2017,10:12 PM   Addum:  Heparin level 0.26 units/ml.  Increase heparin drip to 950 units/hr.  Recheck later today

## 2017-12-25 NOTE — ED Triage Notes (Signed)
Pt reports central chest pain and left arm pain as well as sob that began this am. Pt reports hx of the same, was seen here on 7/8.

## 2017-12-25 NOTE — Progress Notes (Signed)
ANTICOAGULATION CONSULT NOTE - Initial Consult  Pharmacy Consult for heparin Indication: chest pain/ACS  Allergies  Allergen Reactions  . Propoxyphene N-Acetaminophen Nausea Only    REACTION: GI upset    Patient Measurements: Height: 5\' 10"  (177.8 cm) Weight: 156 lb 3.2 oz (70.9 kg) IBW/kg (Calculated) : 73 Heparin Dosing Weight: 70.9  Vital Signs: Temp: 98.7 F (37.1 C) (07/13 1931) Temp Source: Oral (07/13 1931) BP: 105/60 (07/13 1931) Pulse Rate: 72 (07/13 1931)  Labs: Recent Labs    12/25/17 0839 12/25/17 1412 12/25/17 2021  HGB 17.5* 16.1  --   HCT 53.5* 48.3  --   PLT 353 299  --   CREATININE 1.20 1.12  --   TROPONINI <0.03 1.96* 16.14*    Estimated Creatinine Clearance: 73.9 mL/min (by C-G formula based on SCr of 1.12 mg/dL).   Medical History: Past Medical History:  Diagnosis Date  . Cocaine abuse (HCC)   . H/O ventricular fibrillation 2016   s/p cocaine use  . High cholesterol   . Hypertension   . MI (mitral incompetence)   . Myocardial infarction [facility]) 2016   "related to cocaine"  . Nonobstructive atherosclerosis of coronary artery 2016   LHC showed normal EF and mild non-obstructive CAD    Medications:  Medications Prior to Admission  Medication Sig Dispense Refill Last Dose  . amLODipine (NORVASC) 5 MG tablet Take 1 tablet (5 mg total) by mouth daily. 30 tablet 3 12/23/2017 at Unknown time  . aspirin EC 81 MG tablet Take 1 tablet (81 mg total) by mouth daily. 30 tablet 0 12/24/2017 at Unknown time  . isosorbide mononitrate (IMDUR) 30 MG 24 hr tablet Take 1 tablet (30 mg total) by mouth daily. 30 tablet 2 12/24/2017 at Unknown time  . lidocaine (LIDODERM) 5 % Place 1 patch onto the skin daily. Remove & Discard patch within 12 hours or as directed by MD 30 patch 0 Past Week at Unknown time  . losartan (COZAAR) 25 MG tablet Take 1 tablet (25 mg total) by mouth daily. 30 tablet 2 12/24/2017 at Unknown time  . nitroGLYCERIN (NITROSTAT) 0.4 MG SL  tablet Place 1 tablet (0.4 mg total) under the tongue every 5 (five) minutes as needed for chest pain (for 3 doses). 30 tablet 0 ukn  . simvastatin (ZOCOR) 20 MG tablet Take 1 tablet (20 mg total) by mouth daily at 6 PM. 30 tablet 2 12/23/2017    Assessment: 57 yo man to start heparin for CP.  He was not on anticoagulation PTA Goal of Therapy:  Heparin level 0.3-0.7 units/ml Monitor platelets by anticoagulation protocol: Yes   Plan:  Heparin 3500 unit bolus and drip at 850 units/hr Check heparin level 6-8 hours after start Daily HL and CBC while on heparin Monitor for bleeding complications  Talbert Cage Poteet 12/25/2017,10:06 PM

## 2017-12-25 NOTE — ED Provider Notes (Signed)
[facility] EMERGENCY DEPARTMENT Provider Note   CSN: [id number] Arrival date & time: 12/25/17  0818     History   Chief Complaint Chief Complaint  Patient presents with  . Chest Pain/STEMI    HPI Bryce Ortega is a 57 y.o. male.  HPI   57 yo M with PMHx as below here with CP. Pt reports acute onset of aching, throbbing, substernal CP this AM. States it woke him up from sleep. It's associated with SOB, nausea, and diaphoresis. No cough. No preceding illness. He does admit to using cocaine recently. Denies any vomiting, diarrhea, or abd pain. No fever or chills. No recent travel, no LE edema. He has been taking his meds as prescribed per his report. Pain was alleviated with nitro. No other alleviating factors. Worse with exertion.  Past Medical History:  Diagnosis Date  . Cocaine abuse (HCC)   . H/O ventricular fibrillation 2016   s/p cocaine use  . High cholesterol   . Hypertension   . MI (mitral incompetence)   . Myocardial infarction [facility]) 2016   "related to cocaine"  . Nonobstructive atherosclerosis of coronary artery 2016   LHC showed normal EF and mild non-obstructive CAD    Patient Active Problem List   Diagnosis Date Noted  . AKI (acute kidney injury) (HCC) 07/07/2017  . Ischemia, myocardial, acute (HCC) 07/06/2017  . Elevated troponin   . Cocaine adverse reaction 02/23/2017  . Chest pain 02/22/2017  . Mild CAD 02/22/2017  . NSTEMI (non-ST elevated myocardial infarction) (HCC) 05/15/2015  . Cocaine abuse (HCC) 05/15/2015  . Chest pain at rest   . Cardiac arrest (HCC) 05/10/2015  . Hyperlipidemia 05/10/2015  . Essential hypertension 01/10/2009  . SHOULDER PAIN, RIGHT 01/10/2009  . CERVICALGIA 01/10/2009  . LOW BACK PAIN 01/10/2009    Past Surgical History:  Procedure Laterality Date  . CARDIAC CATHETERIZATION N/A 05/10/2015   Procedure: Left Heart Cath and Coronary Angiography;  Surgeon: Dolores Patty, MD;  Location: [facility]  INVASIVE CV LAB;  Service: Cardiovascular;  Laterality: N/A;  . TONSILLECTOMY          Home Medications    Prior to Admission medications   Medication Sig Start Date End Date Taking? Authorizing Provider  amLODipine (NORVASC) 5 MG tablet Take 1 tablet (5 mg total) by mouth daily. 07/07/17  Yes Rama, Maryruth Bun, MD  aspirin EC 81 MG tablet Take 1 tablet (81 mg total) by mouth daily. 05/11/15  Yes Ghimire, Werner Lean, MD  isosorbide mononitrate (IMDUR) 30 MG 24 hr tablet Take 1 tablet (30 mg total) by mouth daily. 07/07/17  Yes Rama, Maryruth Bun, MD  lidocaine (LIDODERM) 5 % Place 1 patch onto the skin daily. Remove & Discard patch within 12 hours or as directed by MD 09/30/17  Yes Petrucelli, Samantha R, PA-C  losartan (COZAAR) 25 MG tablet Take 1 tablet (25 mg total) by mouth daily. 07/07/17  Yes Rama, Maryruth Bun, MD  nitroGLYCERIN (NITROSTAT) 0.4 MG SL tablet Place 1 tablet (0.4 mg total) under the tongue every 5 (five) minutes as needed for chest pain (for 3 doses). 07/07/17   Rama, Maryruth Bun, MD  simvastatin (ZOCOR) 20 MG tablet Take 1 tablet (20 mg total) by mouth daily at 6 PM. 07/07/17   Rama, Maryruth Bun, MD    Family History Family History  Problem Relation Age of Onset  . Hypertension Father   . Heart disease Father   . Heart disease Mother   .  Heart disease Maternal Grandfather     Social History Social History   Tobacco Use  . Smoking status: Never Smoker  . Smokeless tobacco: Never Used  Substance Use Topics  . Alcohol use: Yes    Alcohol/week: 7.8 oz    Types: 13 Cans of beer per week    Comment: 07/06/2017 "~ 2 40 oz beers 2x/week"  . Drug use: Not Currently    Types: Cocaine    Comment: 07/06/2017 "weekly"     Allergies   Propoxyphene n-acetaminophen   Review of Systems Review of Systems  Constitutional: Positive for fatigue. Negative for chills and fever.  HENT: Negative for congestion and rhinorrhea.   Eyes: Negative for visual disturbance.    Respiratory: Positive for chest tightness and shortness of breath. Negative for cough and wheezing.   Cardiovascular: Positive for chest pain. Negative for leg swelling.  Gastrointestinal: Negative for abdominal pain, diarrhea, nausea and vomiting.  Genitourinary: Negative for dysuria and flank pain.  Musculoskeletal: Negative for neck pain and neck stiffness.  Skin: Negative for rash and wound.  Allergic/Immunologic: Negative for immunocompromised state.  Neurological: Negative for syncope, weakness and headaches.  All other systems reviewed and are negative.    Physical Exam Updated Vital Signs BP (!) 148/98 (BP Location: Left Arm)   Pulse 75   Temp 98 F (36.7 C) (Oral)   Resp 18   Ht 5\' 10"  (1.778 m)   Wt 70.9 kg (156 lb 3.2 oz)   SpO2 97%   BMI 22.41 kg/m   Physical Exam  Constitutional: He is oriented to person, place, and time. He appears well-developed and well-nourished. He appears distressed.  HENT:  Head: Normocephalic and atraumatic.  Eyes: Conjunctivae are normal.  Neck: Neck supple.  Cardiovascular: Normal rate, regular rhythm and normal heart sounds. Exam reveals no friction rub.  No murmur heard. Pulmonary/Chest: Effort normal and breath sounds normal. No respiratory distress. He has no wheezes. He has no rales.  Abdominal: He exhibits no distension.  Musculoskeletal: He exhibits no edema.  Neurological: He is alert and oriented to person, place, and time. He exhibits normal muscle tone.  Skin: Skin is warm. Capillary refill takes less than 2 seconds. He is diaphoretic.  Psychiatric: He has a normal mood and affect.  Nursing note and vitals reviewed.    ED Treatments / Results  Labs (all labs ordered are listed, but only abnormal results are displayed) Labs Reviewed  BASIC METABOLIC PANEL - Abnormal; Notable for the following components:      Result Value   Glucose, Bld 106 (*)    Calcium 8.3 (*)    All other components within normal limits  CBC -  Abnormal; Notable for the following components:   RBC 5.86 (*)    Hemoglobin 17.5 (*)    HCT 53.5 (*)    All other components within normal limits  RAPID URINE DRUG SCREEN, HOSP PERFORMED - Abnormal; Notable for the following components:   Cocaine POSITIVE (*)    Barbiturates   (*)    Value: Result not available. Reagent lot number recalled by manufacturer.   All other components within normal limits  TROPONIN I - Abnormal; Notable for the following components:   Troponin I 1.96 (*)    All other components within normal limits  TROPONIN I  MAGNESIUM  BRAIN NATRIURETIC PEPTIDE  CBC  CREATININE, SERUM  TROPONIN I  TROPONIN I  I-STAT TROPONIN, ED    EKG EKG Interpretation  Date/Time:  Saturday December 25 2017 08:24:43 EDT Ventricular Rate:  73 PR Interval:  160 QRS Duration: 78 QT Interval:  394 QTC Calculation: 434 R Axis:   52 Text Interpretation:  Normal sinus rhythm Normal ECG Since last EKG, there are new ST depressions in inferior leads Confirmed by Shaune Pollack [phone]) on 12/25/2017 8:26:16 AM   Radiology Dg Chest 2 View  Result Date: 12/25/2017 CLINICAL DATA:  Central chest pain.  Shortness of breath. EXAM: CHEST - 2 VIEW COMPARISON:  December 20, 2017 FINDINGS: The heart size and mediastinal contours are within normal limits. Both lungs are clear. The visualized skeletal structures are unremarkable. IMPRESSION: No active cardiopulmonary disease. Electronically Signed   By: Gerome Sam III M.D   On: 12/25/2017 08:58    Procedures .Critical Care Performed by: Shaune Pollack, MD Authorized by: Shaune Pollack, MD   Critical care provider statement:    Critical care time (minutes):  35   Critical care time was exclusive of:  Separately billable procedures and treating other patients and teaching time   Critical care was necessary to treat or prevent imminent or life-threatening deterioration of the following conditions:  Cardiac failure, circulatory failure and  respiratory failure   Critical care was time spent personally by me on the following activities:  Development of treatment plan with patient or surrogate, discussions with consultants, evaluation of patient's response to treatment, examination of patient, obtaining history from patient or surrogate, ordering and performing treatments and interventions, ordering and review of laboratory studies, ordering and review of radiographic studies, pulse oximetry, re-evaluation of patient's condition and review of old charts   I assumed direction of critical care for this patient from another provider in my specialty: no     (including critical care time)  Medications Ordered in ED Medications  nitroGLYCERIN (NITROSTAT) SL tablet 0.4 mg (0.4 mg Sublingual Given 12/25/17 1008)  amLODipine (NORVASC) tablet 5 mg (5 mg Oral Given 12/25/17 1524)  aspirin EC tablet 81 mg (81 mg Oral Given 12/25/17 1524)  isosorbide mononitrate (IMDUR) 24 hr tablet 30 mg (30 mg Oral Given 12/25/17 1524)  lidocaine (LIDODERM) 5 % 1 patch (1 patch Transdermal Not Given 12/25/17 1525)  losartan (COZAAR) tablet 25 mg (25 mg Oral Given 12/25/17 1523)  simvastatin (ZOCOR) tablet 20 mg (20 mg Oral Given 12/25/17 1800)  acetaminophen (TYLENOL) tablet 650 mg (has no administration in time range)  ondansetron (ZOFRAN) injection 4 mg (has no administration in time range)  0.9 % NaCl with KCl 20 mEq/ L  infusion ( Intravenous New Bag/Given 12/25/17 1806)  gi cocktail (Maalox,Lidocaine,Donnatal) (has no administration in time range)  zolpidem (AMBIEN) tablet 5 mg (has no administration in time range)  ALPRAZolam (XANAX) tablet 0.25 mg (has no administration in time range)  ketorolac (TORADOL) 30 MG/ML injection 30 mg (has no administration in time range)  nitroGLYCERIN 50 mg in dextrose 5 % 250 mL (0.2 mg/mL) infusion (5 mcg/min Intravenous New Bag/Given 12/25/17 1636)  enoxaparin (LOVENOX) injection 70 mg (70 mg Subcutaneous Not Given 12/25/17  1638)  aspirin chewable tablet 324 mg (324 mg Oral Given 12/25/17 1004)  nitroGLYCERIN 50 mg in dextrose 5 % 250 mL (0.2 mg/mL) infusion (0 mcg/min Intravenous Transfusing/Transfer 12/25/17 1316)  LORazepam (ATIVAN) injection 1 mg (1 mg Intravenous Given 12/25/17 1026)     Initial Impression / Assessment and Plan / ED Course  I have reviewed the triage vital signs and the nursing notes.  Pertinent labs & imaging results that were available during my care of  the patient were reviewed by me and considered in my medical decision making (see chart for details).     57 yo M with PMHx CAD, cocaine abuse here with CP. Pt distressed, in pain on exam - responded well to nitro. Concern for cocaine-induced CP/vasospasm. Of note, in ED, pt developed worsening sx and was noticed to go into what appears to be an accelerated IV rhythm - d/w Dr. Katrinka Blazing, no ST elevations. Admit to medicine with cards c/s. Nitro gtt started, ASA given. Will hold on heparin per Cards.  Final Clinical Impressions(s) / ED Diagnoses   Final diagnoses:  Chest pain due to myocardial ischemia, unspecified ischemic chest pain type    ED Discharge Orders    None       Shaune Pollack, MD 12/25/17 1846

## 2017-12-25 NOTE — Significant Event (Signed)
Patient's troponins are now positive.  He had a similar presentation in January except at that time he had been out of facility cardiac arrest.  I spoke with Dr. Diona Browner from cardiology I plan to add Lovenox at treatment doses at 1 mg/kg subcu every 12 hours and I will continue the nitroglycerin drip.  Previously had discussed with the nurse to discontinue the drip at this point we will continue it.  If the patient has chest worsening chest pain or starts to look worse than consultation to cardiology would be indicated.  Continue not to use beta-blockers in this patient as he has cocaine chest pain.

## 2017-12-25 NOTE — ED Notes (Signed)
Heart healthy tray ordered 

## 2017-12-25 NOTE — Progress Notes (Signed)
CRITICAL VALUE ALERT  Critical Value: Troponin    Date & Time Notied: 1530   Provider Notified: Dr. Willette Pa    Orders Received/Actions taken: IV nitroglycerin restarted per MD order.

## 2017-12-25 NOTE — ED Notes (Signed)
Patient transported to X-ray 

## 2017-12-25 NOTE — Progress Notes (Addendum)
CRITICAL VALUE ALERT  Critical Value: Troponin 16.14  Date & Time Notied: 2146   Provider Notified: [place]  Orders Received/Actions taken: EKG; Heparin iv

## 2017-12-25 NOTE — Plan of Care (Signed)
  Problem: Health Behavior/Discharge Planning: Goal: Ability to manage health-related needs will improve Outcome: Progressing   

## 2017-12-25 NOTE — ED Notes (Signed)
[facility] contacted to activate Code Stemi 

## 2017-12-25 NOTE — H&P (Signed)
History and Physical  UNASSIGNED MEDICAL ADMISSION  Bryce Ortega WFU:[id number] DOB: [birth year] DOA: 12/25/2017  PCP: Lavinia Sharps, NP  Patient coming from: Home  I have personally briefly reviewed patient's old medical records in [facility]  Chief Complaint: Chest pain which began this morning  HPI: Bryce Ortega is a 57 y.o. male with medical history significant of chest pain with myocardial infarction in January 2019 status post cardiac catheterization which was unremarkable hypercholesterolemia, hypertension, cardiac arrest, multiple admissions for cocaine related chest pain who presents to the ER complaining of chest pain with pain radiating into his left arm and associated shortness of breath that began this morning.  Nothing makes the pain better nothing makes the pain worse.  The ER patient received nitroglycerin sublingually x2 as well as an infusion of nitroglycerin has now been discontinued, Ativan and an aspirin.     ED Course: EKG shows sinus rhythm and is unremarkable.  Chest x-ray is negative.  Cardiac enzymes are negative.  Dr. Erma Heritage spoke with the cardiologist on-call who does not feel the patient needs to undergo any stress testing or repeat cardiac catheterization at this point.  Cardiology did recommend observation to rule out myocardial infarction.  Review of Systems: As per HPI otherwise all other systems reviewed and  negative.    Past Medical History:  Diagnosis Date  . Cocaine abuse (HCC)   . H/O ventricular fibrillation 2016   s/p cocaine use  . High cholesterol   . Hypertension   . MI (mitral incompetence)   . Myocardial infarction [facility]) 2016   "related to cocaine"  . Nonobstructive atherosclerosis of coronary artery 2016   LHC showed normal EF and mild non-obstructive CAD    Past Surgical History:  Procedure Laterality Date  . CARDIAC CATHETERIZATION N/A 05/10/2015   Procedure: Left Heart Cath and Coronary Angiography;  Surgeon: Dolores Patty, MD;  Location: [facility] INVASIVE CV LAB;  Service: Cardiovascular;  Laterality: N/A;  . TONSILLECTOMY      Social History   Social History Narrative   ** Merged History Encounter **         reports that he has never smoked. He has never used smokeless tobacco. He reports that he drinks about 7.8 oz of alcohol per week. He reports that he has current or past drug history. Drug: Cocaine.  Allergies  Allergen Reactions  . Propoxyphene N-Acetaminophen Nausea Only    REACTION: GI upset    Family History  Problem Relation Age of Onset  . Hypertension Father   . Heart disease Father   . Heart disease Mother   . Heart disease Maternal Grandfather      Prior to Admission medications   Medication Sig Start Date End Date Taking? Authorizing Provider  amLODipine (NORVASC) 5 MG tablet Take 1 tablet (5 mg total) by mouth daily. 07/07/17  Yes Rama, Maryruth Bun, MD  aspirin EC 81 MG tablet Take 1 tablet (81 mg total) by mouth daily. 05/11/15  Yes Ghimire, Werner Lean, MD  isosorbide mononitrate (IMDUR) 30 MG 24 hr tablet Take 1 tablet (30 mg total) by mouth daily. 07/07/17  Yes Rama, Maryruth Bun, MD  lidocaine (LIDODERM) 5 % Place 1 patch onto the skin daily. Remove & Discard patch within 12 hours or as directed by MD 09/30/17  Yes Petrucelli, Samantha R, PA-C  losartan (COZAAR) 25 MG tablet Take 1 tablet (25 mg total) by mouth daily. 07/07/17  Yes Rama, Maryruth Bun, MD  methocarbamol (  ROBAXIN) 500 MG tablet Take 1 tablet (500 mg total) by mouth every 8 (eight) hours as needed. Patient not taking: Reported on 12/25/2017 09/30/17   Petrucelli, Pleas Koch, PA-C  nitroGLYCERIN (NITROSTAT) 0.4 MG SL tablet Place 1 tablet (0.4 mg total) under the tongue every 5 (five) minutes as needed for chest pain (for 3 doses). 07/07/17   Rama, Maryruth Bun, MD  simvastatin (ZOCOR) 20 MG tablet Take 1 tablet (20 mg total) by mouth daily at 6 PM. 07/07/17   Rama, Maryruth Bun, MD    Physical  Exam:  Constitutional: NAD, calm, comfortable Vitals:   12/25/17 1015 12/25/17 1030 12/25/17 1045 12/25/17 1107  BP: 127/79 133/83 132/90   Pulse: 74 71 73   Resp: 16 16 20    Temp:      TempSrc:      SpO2: 95% 97% 97%   Weight:    73.5 kg (162 lb)  Height:    5\' 10"  (1.778 m)   Eyes: PERRL, lids and conjunctivae normal ENMT: Mucous membranes are moist. Posterior pharynx clear of any exudate or lesions.Normal dentition.  Neck: normal, supple, no masses, no thyromegaly Respiratory: clear to auscultation bilaterally, no wheezing, no crackles. Normal respiratory effort. No accessory muscle use.  Cardiovascular: Regular rate and rhythm, no murmurs / rubs / gallops. No extremity edema. 2+ pedal pulses. No carotid bruits.  Abdomen: no tenderness, no masses palpated. No hepatosplenomegaly. Bowel sounds positive.  Musculoskeletal: no clubbing / cyanosis. No joint deformity upper and lower extremities. Good ROM, no contractures. Normal muscle tone.  Skin: no rashes, lesions, ulcers. No induration Neurologic: CN 2-12 grossly intact. Sensation intact, DTR normal. Strength 5/5 in all 4.  Psychiatric: Normal judgment and insight. Alert and oriented x 3. Normal mood.   Labs on Admission: I have personally reviewed following labs and imaging studies  CBC: Recent Labs  Lab 12/20/17 0227 12/25/17 0839  WBC 7.3 9.7  HGB 15.4 17.5*  HCT 47.8 53.5*  MCV 92.1 91.3  PLT 299 353   Basic Metabolic Panel: Recent Labs  Lab 12/20/17 0227 12/25/17 0839  NA 140 140  K 3.7 4.2  CL 105 107  CO2 23 23  GLUCOSE 144* 106*  BUN 19 17  CREATININE 1.08 1.20  CALCIUM 8.7* 8.3*  MG  --  2.2   GFR: Estimated Creatinine Clearance: 71 mL/min (by C-G formula based on SCr of 1.2 mg/dL). Cardiac Enzymes: Recent Labs  Lab 12/25/17 0839  TROPONINI <0.03    Radiological Exams on Admission: Dg Chest 2 View  Result Date: 12/25/2017 CLINICAL DATA:  Central chest pain.  Shortness of breath. EXAM: CHEST -  2 VIEW COMPARISON:  December 20, 2017 FINDINGS: The heart size and mediastinal contours are within normal limits. Both lungs are clear. The visualized skeletal structures are unremarkable. IMPRESSION: No active cardiopulmonary disease. Electronically Signed   By: Gerome Sam III M.D   On: 12/25/2017 08:58    EKG: Independently reviewed.  Sinus rhythm with abnormal R wave progression and abnormal ST segments and inferior leads.  Assessment/Plan Principal Problem:   Chest pain Active Problems:   Cocaine abuse (HCC)   Essential hypertension   Hyperlipidemia  1.  Chest pain: Patient presents with cocaine associated chest pain.  He has had multiple admissions in the past for the same.  In January he presented with a cardiac arrest was found to have a myocardial infarction and underwent cardiac catheterization which was found to be unremarkable.  He was discharged home on  medications to help prevent coronary vasospasms.  Patient will be placed in observation we will cycle serial cardiac enzymes and repeat an EKG in the morning if this is abnormal we will plan repeat discussion with cardiology.  I doubt that the patient would require cardiac catheterization or stress test at this point.  2.  Cocaine abuse: Patient states that he has many years ago attended a rehabilitation program.  He states that it was useful and helped him stay off cocaine for some period of time.  He does request referral to outpatient cocaine treatment programs after we discussed the possibility.  We will refer the patient to social work.  3.  Essential hypertension: We will continue home medications.  4.  Hyperlipidemia: We will continue home medications.  DVT prophylaxis: lovenox Code Status: full code Family Communication:retains capacity; no family present Disposition Plan: *home in 24 hours Consults called: none Admission status: observation   Lahoma Crocker MD FACP [facility] Pager [phone]  If  7PM-7AM, please contact night-coverage [email or web address] Password [facility]  12/25/2017, 12:58 PM

## 2017-12-26 ENCOUNTER — Encounter (HOSPITAL_COMMUNITY): Payer: Self-pay | Admitting: Physician Assistant

## 2017-12-26 DIAGNOSIS — I10 Essential (primary) hypertension: Secondary | ICD-10-CM

## 2017-12-26 DIAGNOSIS — E785 Hyperlipidemia, unspecified: Secondary | ICD-10-CM

## 2017-12-26 DIAGNOSIS — F1410 Cocaine abuse, uncomplicated: Secondary | ICD-10-CM

## 2017-12-26 DIAGNOSIS — I214 Non-ST elevation (NSTEMI) myocardial infarction: Principal | ICD-10-CM

## 2017-12-26 LAB — HEPARIN LEVEL (UNFRACTIONATED)
HEPARIN UNFRACTIONATED: 0.26 [IU]/mL — AB (ref 0.30–0.70)
HEPARIN UNFRACTIONATED: 0.22 [IU]/mL — AB (ref 0.30–0.70)
Heparin Unfractionated: 0.28 [IU]/mL — ABNORMAL LOW (ref 0.30–0.70)

## 2017-12-26 LAB — CBC
HEMATOCRIT: 43.8 % (ref 39.0–52.0)
Hemoglobin: 14.3 g/dL (ref 13.0–17.0)
MCH: 30.2 pg (ref 26.0–34.0)
MCHC: 32.6 g/dL (ref 30.0–36.0)
MCV: 92.4 fL (ref 78.0–100.0)
Platelets: 296 10*3/uL (ref 150–400)
RBC: 4.74 MIL/uL (ref 4.22–5.81)
RDW: 13.4 % (ref 11.5–15.5)
WBC: 8.7 10*3/uL (ref 4.0–10.5)

## 2017-12-26 LAB — TROPONIN I: TROPONIN I: 12.27 ng/mL — AB (ref <0.03)

## 2017-12-26 MED ORDER — ATORVASTATIN CALCIUM 80 MG PO TABS: 80.0000 mg | ORAL_TABLET | Freq: Every day | ORAL | Status: DC

## 2017-12-26 MED ORDER — SODIUM CHLORIDE 0.9 % WEIGHT BASED INFUSION: 1.0000 mL/kg/h | INTRAVENOUS | Status: DC

## 2017-12-26 MED ORDER — ASPIRIN 81 MG PO CHEW
81.0000 mg | CHEWABLE_TABLET | ORAL | Status: AC
  Administered 2017-12-27: 81 mg via ORAL
  Filled 2017-12-26: qty 1

## 2017-12-26 MED ORDER — SODIUM CHLORIDE 0.9 % WEIGHT BASED INFUSION
3.0000 mL/kg/h | INTRAVENOUS | Status: AC
  Administered 2017-12-27: 3 mL/kg/h via INTRAVENOUS

## 2017-12-26 MED ORDER — ATORVASTATIN CALCIUM 80 MG PO TABS
80.0000 mg | ORAL_TABLET | Freq: Every day | ORAL | Status: DC
Start: 2017-12-26 — End: 2017-12-28
  Administered 2017-12-26 – 2017-12-27 (×2): 80 mg via ORAL
  Filled 2017-12-26 (×2): qty 1

## 2017-12-26 NOTE — H&P (View-Only) (Signed)
Cardiology Consultation:   Patient ID: Bryce Ortega; [id number]; 04/05/1961   Admit date: 12/25/2017 Date of Consult: 12/26/2017  Primary Care Provider: Placey, Mary Ann, NP Primary Cardiologist: Henry W Smith III, MD (saw in hospital in Jan 2019) Primary Electrophysiologist:  n/a   Patient Profile:   Bryce Ortega is a 57 y.o. male with a hx of cocaine abuse, nonobstructive coronary disease, hypertension, ventricular fibrillation arrest in the setting of cocaine use in 2016 and hyperlipidemia who is being seen today for the evaluation of non-ST elevation myocardial infarction at the request of Theresa Sheehan, MD.  History of Present Illness:   Mr. Som has a history of cocaine abuse and has had several ED visits related to chest pain attributed to cocaine use.  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  Cardiac catheterization at that time demonstrated nonobstructive coronary disease.  He has had no outpatient follow-up with cardiology.  He was last seen by our service in January 2019 during admission with chest pain and minimally elevated troponin levels.  UDS was positive for cocaine at that time.  Chest discomfort that time was felt to be related to vasospasm from cocaine.  He presented to the hospital yesterday with sudden onset of substernal chest discomfort with associated shortness of breath.  He tells me that he used cocaine last 3 days ago.  His pain is improved now.  He is currently on IV heparin and IV nitroglycerin.  Prior to admission, he had no difficulty with exertional chest pain or shortness of breath.  He denies syncope, paroxysmal nocturnal dyspnea, lower extremity edema.  Initial troponin was negative but increased to 1.96 and peaked at 16.14.  Most recent troponin was 12.27.  Echocardiogram 05/18/2017 Study Conclusions  - Left ventricle: The cavity size was normal. Wall thickness was   increased in a pattern of mild LVH. There was focal  basal   hypertrophy. Systolic function was normal. The estimated ejection   fraction was in the range of 50% to 55%. Wall motion was normal;   there were no regional wall motion abnormalities. Left   ventricular diastolic function parameters were normal.  Cardiac catheterization 05/10/2015 LM normal LAD distal 30 LCx normal RCA mid 40 EF 55-65  Past Medical History:  Diagnosis Date  . Cocaine abuse (HCC)   . H/O ventricular fibrillation 2016   s/p cocaine use  . High cholesterol   . Hypertension   . MI (mitral incompetence)   . Myocardial infarction (HCC) 2016   "related to cocaine"  . Nonobstructive atherosclerosis of coronary artery 2016   LHC showed normal EF and mild non-obstructive CAD    Past Surgical History:  Procedure Laterality Date  . CARDIAC CATHETERIZATION N/A 05/10/2015   Procedure: Left Heart Cath and Coronary Angiography;  Surgeon: Daniel R Bensimhon, MD;  Location: [facility];  Service: Cardiovascular;  Laterality: N/A;  . TONSILLECTOMY       Home Medications:  Prior to Admission medications   Medication Sig Start Date End Date Taking? Authorizing Provider  amLODipine (NORVASC) 5 MG tablet Take 1 tablet (5 mg total) by mouth daily. 07/07/17  Yes Rama, Christina P, MD  aspirin EC 81 MG tablet Take 1 tablet (81 mg total) by mouth daily. 05/11/15  Yes Ghimire, Shanker M, MD  isosorbide mononitrate (IMDUR) 30 MG 24 hr tablet Take 1 tablet (30 mg total) by mouth daily. 07/07/17  Yes Rama, Christina P, MD  lidocaine (LIDODERM) 5 %   Place 1 patch onto the skin daily. Remove & Discard patch within 12 hours or as directed by MD 09/30/17  Yes Petrucelli, Samantha R, PA-C  losartan (COZAAR) 25 MG tablet Take 1 tablet (25 mg total) by mouth daily. 07/07/17  Yes Rama, Christina P, MD  nitroGLYCERIN (NITROSTAT) 0.4 MG SL tablet Place 1 tablet (0.4 mg total) under the tongue every 5 (five) minutes as needed for chest pain (for 3 doses). 07/07/17   Rama, Christina P, MD    simvastatin (ZOCOR) 20 MG tablet Take 1 tablet (20 mg total) by mouth daily at 6 PM. 07/07/17   Rama, Christina P, MD    Inpatient Medications: Scheduled Meds: . amLODipine  5 mg Oral Daily  . aspirin EC  81 mg Oral Daily  . lidocaine  1 patch Transdermal Q24H  . losartan  25 mg Oral Daily   Continuous Infusions: . heparin 950 Units/hr (12/26/17 0512)  . nitroGLYCERIN 5 mcg/min (12/25/17 2300)   PRN Meds: acetaminophen, ALPRAZolam, gi cocktail, ketorolac, nitroGLYCERIN, ondansetron (ZOFRAN) IV, zolpidem  Allergies:    Allergies  Allergen Reactions  . Propoxyphene N-Acetaminophen Nausea Only    REACTION: GI upset    Social History:   Social History   Socioeconomic History  . Marital status: Divorced    Spouse name: Not on file  . Number of children: Not on file  . Years of education: Not on file  . Highest education level: Not on file  Occupational History  . Not on file  Social Needs  . Financial resource strain: Not on file  . Food insecurity:    Worry: Not on file    Inability: Not on file  . Transportation needs:    Medical: Not on file    Non-medical: Not on file  Tobacco Use  . Smoking status: Never Smoker  . Smokeless tobacco: Never Used  Substance and Sexual Activity  . Alcohol use: Yes    Alcohol/week: 7.8 oz    Types: 13 Cans of beer per week    Comment: 07/06/2017 "~ 2 40 oz beers 2x/week"  . Drug use: Yes    Types: Cocaine    Comment: 07/06/2017 "weekly"  . Sexual activity: Yes  Lifestyle  . Physical activity:    Days per week: Not on file    Minutes per session: Not on file  . Stress: Not on file  Relationships  . Social connections:    Talks on phone: Not on file    Gets together: Not on file    Attends religious service: Not on file    Active member of club or organization: Not on file    Attends meetings of clubs or organizations: Not on file    Relationship status: Not on file  . Intimate partner violence:    Fear of current or ex  partner: Not on file    Emotionally abused: Not on file    Physically abused: Not on file    Forced sexual activity: Not on file  Other Topics Concern  . Not on file  Social History Narrative   ** Merged History Encounter **        Family History:    Family History  Problem Relation Age of Onset  . Hypertension Father   . CAD Father        s/p CABG  . Heart disease Mother   . Heart disease Maternal Grandfather      ROS:  Please see the history of present illness.    He denies fevers, chills, cough, vomiting, diarrhea, melena, hematochezia. All other ROS reviewed and negative.     Physical Exam/Data:   Vitals:   12/25/17 1931 12/25/17 2140 12/26/17 0438 12/26/17 0727  BP: 105/60 115/69 120/81 130/89  Pulse: 72 70 66   Resp: 17  16   Temp: 98.7 F (37.1 C)  97.8 F (36.6 C)   TempSrc: Oral  Oral   SpO2: 96%  96%   Weight:   159 lb 14.4 oz (72.5 kg)   Height:        Intake/Output Summary (Last 24 hours) at 12/26/2017 0959 Last data filed at 12/26/2017 0512 Gross per 24 hour  Intake 746 ml  Output 1260 ml  Net -514 ml   Filed Weights   12/25/17 1107 12/25/17 1350 12/26/17 0438  Weight: 162 lb (73.5 kg) 156 lb 3.2 oz (70.9 kg) 159 lb 14.4 oz (72.5 kg)   Body mass index is 22.94 kg/m.  General:  Well nourished, well developed, in no acute distress  HEENT: normal Lymph: no adenopathy Neck: no JVD Endocrine:  No thryomegaly Vascular: No carotid bruits   Cardiac:  normal S1, S2; RRR; no murmur   Lungs:  clear to auscultation bilaterally, no wheezing, rhonchi or rales  Abd: soft, nontender   Ext: no edema Musculoskeletal:  No deformities  Skin: warm and dry  Neuro:  CNs 2-12 intact, no focal abnormalities noted Psych:  Normal affect   EKG:  The EKG was personally reviewed and demonstrates:   12/25/2017:?  Ectopic atrial rhythm with heart rate 99, right bundle branch block, deep inferolateral ST depression 12/26/2017: Normal sinus rhythm, heart rate 63, normal  axis, QTC 435  Telemetry:  Telemetry was personally reviewed and demonstrates:  NSR, occ 3-4 beats wide complex tachy (NSVT vs aberrancy)   Relevant CV Studies: None since admission   Laboratory Data:  Chemistry Recent Labs  Lab 12/20/17 0227 12/25/17 0839 12/25/17 1412  NA 140 140  --   K 3.7 4.2  --   CL 105 107  --   CO2 23 23  --   GLUCOSE 144* 106*  --   BUN 19 17  --   CREATININE 1.08 1.20 1.12  CALCIUM 8.7* 8.3*  --   GFRNONAA >60 >60 >60  GFRAA >60 >60 >60  ANIONGAP 12 10  --     No results for input(s): PROT, ALBUMIN, AST, ALT, ALKPHOS, BILITOT in the last 168 hours. Hematology Recent Labs  Lab 12/25/17 0839 12/25/17 1412 12/26/17 0342  WBC 9.7 10.3 8.7  RBC 5.86* 5.34 4.74  HGB 17.5* 16.1 14.3  HCT 53.5* 48.3 43.8  MCV 91.3 90.4 92.4  MCH 29.9 30.1 30.2  MCHC 32.7 33.3 32.6  RDW 13.2 13.2 13.4  PLT 353 299 296   Cardiac Enzymes Recent Labs  Lab 12/25/17 0839 12/25/17 1412 12/25/17 2021 12/26/17 0342  TROPONINI <0.03 1.96* 16.14* 12.27*    Recent Labs  Lab 12/20/17 0256 12/20/17 0619 12/25/17 0847  TROPIPOC 0.00 0.01 0.00    BNP Recent Labs  Lab 12/25/17 0839  BNP 11.3    DDimer No results for input(s): DDIMER in the last 168 hours.   Lab Results  Component Value Date   COCAINSCRNUR POSITIVE (A) 12/25/2017    Radiology/Studies:  Dg Chest 2 View  Result Date: 12/25/2017 CLINICAL DATA:  Central chest pain.  Shortness of breath. EXAM: CHEST - 2 VIEW COMPARISON:  December 20, 2017 FINDINGS: The heart size and mediastinal contours are   within normal limits. Both lungs are clear. The visualized skeletal structures are unremarkable. IMPRESSION: No active cardiopulmonary disease. Electronically Signed   By: David  Williams III M.D   On: 12/25/2017 08:58    Assessment and Plan:   1. Non-ST Elevation Myocardial Infarction He presents with chest pain starting 3 days after using cocaine.  His Troponin levels have peaked at 16.  His ECG  yesterday demonstrated diffuse ST depression.  His pain is better on IV NTG.  He is still having some discomfort and describes his pain as "1/10."  I suspect his myocardial infarction was induced by coronary vasospasm in the setting of cocaine abuse.  However, he will need a Cardiac Catheterization to rule out obstructive coronary artery disease.  This will be scheduled for tomorrow, unless he has worsening symptoms that cannot be managed.    - Continue ASA, Amlodipine, IV NTG, IV Heparin  - Change low dose Simvastatin to Atorvastatin 80 mg QD  - No beta-blocker due to recent cocaine use  - Plan Cardiac Catheterization tomorrow (7/15)  - Arrange echocardiogram   2. Cocaine Use We discussed the dangers of continued substance abuse, including death.  He is agreeable to discussing possible outpatient treatment programs with a social worker.    3. Hypertension  Fair control.  Continue Amlodipine, Losartan.  4. Hyperlipidemia As noted, low dose Simvastatin will be changed to high intensity statin (Atorvastatin 80).    For questions or updates, please contact [facility] Please consult [email or web address] for contact info under Cardiology/STEMI.   Signed, Scott Weaver, PA-C  12/26/2017 9:59 AM    Attending note:  Patient seen and examined.  I reviewed his records and discussed the case with Mr. Weaver PA-C.  Mr. Fontes has a history of nonobstructive CAD documented at cardiac catheterization back in 2016 when he presented with VF arrest in the setting of cocaine induced vasospasm.  He is currently admitted to the hospital reporting cocaine use 3 days ago and recent onset chest discomfort.  Troponin I levels have increased from 1.96 up to 16.14 although he reports improvement in chest pain on combination of intravenous nitroglycerin and heparin.  ECG shows nonspecific ST segment changes, he did have an accelerated idioventricular rhythm noted yesterday.  On examination this morning he appears  comfortable.  Heart rate is in the 60s in sinus rhythm by telemetry.  Systolic blood pressure steady in the 120s to 130s.  Lungs are clear without labored breathing.  Cardiac exam reveals RRR without gallop.  Lab work shows troponin I down from 16.14 to 12.27, hemoglobin 14.3, platelets 296, potassium 4.2, BUN 17, creatinine 1.2.  Chest x-ray reports no acute process.  Patient presents with NSTEMI in the setting of recent cocaine use.  He has previously documented nonobstructive CAD as of 2016.  Current troponin I trend does not exclude a plaque rupture event, however certainly recurrent coronary vasospasm is certainly likely.  Would continue aspirin, Norvasc, Cozaar, along with intravenous heparin and nitroglycerin.  We have discussed a follow-up diagnostic cardiac catheterization to reassess coronary anatomy, although the bigger picture here is complete cessation of cocaine use.  Patient has not maintained outpatient cardiac follow-up, and this will need to be carefully considered by the interventional cardiologist performing the procedure in case PCI is performed.  Reizy Dunlow G. Kempton Milne, M.D., F.A.C.C.  

## 2017-12-26 NOTE — Progress Notes (Signed)
PROGRESS NOTE  Bryce Ortega ZOX:[id number] DOB: 04-03-1961 DOA: 12/25/2017 PCP: Lavinia Sharps, NP   LOS: 0 days   Brief Narrative / Interim history: 57 yo M with history of, medical nonadherence, cocaine abuse with multiple admissions for cocaine related chest pain, cardiac arrest in 2016 in the setting of cane use, also underwent a cardiac cath in 2016 which showed nonobstructive coronary artery disease, is admitted to the hospital with chest pain.  Admits to cocaine use.  He was found to have an elevated troponin, EKG changes, and cardiology was consulted  Assessment & Plan: Principal Problem:   Chest pain Active Problems:   Essential hypertension   Hyperlipidemia   Cocaine abuse (HCC)   NSTEMI -Consulted cardiology, troponin went up to 16 and now improving, but patient is still having chest pain.  He is on a heparin infusion as well as nitroglycerin.  Closely monitor for bleeding.  Plan for cardiac catheterization per cardiology, discussed with Dr. Diona Browner  Cocaine abuse -Counseled for cessation, social worker consulted  Hypertension -Blood pressure well controlled, continue Norvasc and losartan  Hyperlipidemia -On statin   DVT prophylaxis: Heparin infusion Code Status: Full code Family Communication: no family at bedside Disposition Plan: home when ready   Consultants:   Cardiology   Procedures:   2D echo: pending  Antimicrobials:  None    Subjective: -Complains of substernal chest pain, mild shortness of breath, no nausea or vomiting.  Denies abdominal pain.  Objective: Vitals:   12/25/17 2140 12/26/17 0438 12/26/17 0727 12/26/17 1321  BP: 115/69 120/81 130/89 129/81  Pulse: 70 66  71  Resp:  16    Temp:  97.8 F (36.6 C)  98.2 F (36.8 C)  TempSrc:  Oral  Oral  SpO2:  96%  96%  Weight:  72.5 kg (159 lb 14.4 oz)    Height:        Intake/Output Summary (Last 24 hours) at 12/26/2017 1417 Last data filed at 12/26/2017 1300 Gross per 24 hour    Intake 986 ml  Output 1560 ml  Net -574 ml   Filed Weights   12/25/17 1107 12/25/17 1350 12/26/17 0438  Weight: 73.5 kg (162 lb) 70.9 kg (156 lb 3.2 oz) 72.5 kg (159 lb 14.4 oz)    Examination:  Constitutional: NAD Eyes: PERRL, lids and conjunctivae normal ENMT: Mucous membranes are moist.  Neck: normal, supple Respiratory: clear to auscultation bilaterally, no wheezing, no crackles. Normal respiratory effort. No accessory muscle use.  Cardiovascular: Regular rate and rhythm, no murmurs / rubs / gallops. No LE edema. 2+ pedal pulses.  Abdomen: no tenderness. Bowel sounds positive.  Skin: no rashes Neurologic: CN 2-12 grossly intact. Strength 5/5 in all 4.    Data Reviewed: I have independently reviewed following labs and imaging studies   CBC: Recent Labs  Lab 12/20/17 0227 12/25/17 0839 12/25/17 1412 12/26/17 0342  WBC 7.3 9.7 10.3 8.7  HGB 15.4 17.5* 16.1 14.3  HCT 47.8 53.5* 48.3 43.8  MCV 92.1 91.3 90.4 92.4  PLT 299 353 299 296   Basic Metabolic Panel: Recent Labs  Lab 12/20/17 0227 12/25/17 0839 12/25/17 1412  NA 140 140  --   K 3.7 4.2  --   CL 105 107  --   CO2 23 23  --   GLUCOSE 144* 106*  --   BUN 19 17  --   CREATININE 1.08 1.20 1.12  CALCIUM 8.7* 8.3*  --   MG  --  2.2  --  GFR: Estimated Creatinine Clearance: 75.5 mL/min (by C-G formula based on SCr of 1.12 mg/dL). Liver Function Tests: No results for input(s): AST, ALT, ALKPHOS, BILITOT, PROT, ALBUMIN in the last 168 hours. No results for input(s): LIPASE, AMYLASE in the last 168 hours. No results for input(s): AMMONIA in the last 168 hours. Coagulation Profile: No results for input(s): INR, PROTIME in the last 168 hours. Cardiac Enzymes: Recent Labs  Lab 12/25/17 0839 12/25/17 1412 12/25/17 2021 12/26/17 0342  TROPONINI <0.03 1.96* 16.14* 12.27*   BNP (last 3 results) No results for input(s): PROBNP in the last 8760 hours. HbA1C: No results for input(s): HGBA1C in the last  72 hours. CBG: No results for input(s): GLUCAP in the last 168 hours. Lipid Profile: No results for input(s): CHOL, HDL, LDLCALC, TRIG, CHOLHDL, LDLDIRECT in the last 72 hours. Thyroid Function Tests: No results for input(s): TSH, T4TOTAL, FREET4, T3FREE, THYROIDAB in the last 72 hours. Anemia Panel: No results for input(s): VITAMINB12, FOLATE, FERRITIN, TIBC, IRON, RETICCTPCT in the last 72 hours. Urine analysis: No results found for: COLORURINE, APPEARANCEUR, LABSPEC, PHURINE, GLUCOSEU, HGBUR, BILIRUBINUR, KETONESUR, PROTEINUR, UROBILINOGEN, NITRITE, LEUKOCYTESUR Sepsis Labs: Invalid input(s): PROCALCITONIN, LACTICIDVEN  No results found for this or any previous visit (from the past 240 hour(s)).    Radiology Studies: Dg Chest 2 View  Result Date: 12/25/2017 CLINICAL DATA:  Central chest pain.  Shortness of breath. EXAM: CHEST - 2 VIEW COMPARISON:  December 20, 2017 FINDINGS: The heart size and mediastinal contours are within normal limits. Both lungs are clear. The visualized skeletal structures are unremarkable. IMPRESSION: No active cardiopulmonary disease. Electronically Signed   By: Gerome Sam III M.D   On: 12/25/2017 08:58     Scheduled Meds: . amLODipine  5 mg Oral Daily  . aspirin EC  81 mg Oral Daily  . atorvastatin  80 mg Oral q1800  . lidocaine  1 patch Transdermal Q24H  . losartan  25 mg Oral Daily   Continuous Infusions: . heparin 950 Units/hr (12/26/17 0512)  . nitroGLYCERIN 10 mcg/min (12/26/17 0730)    Pamella Pert, MD, PhD [facility] Pager [phone]  If 7PM-7AM, please contact night-coverage [email or web address] Password [email or web address] 12/26/2017, 2:17 PM

## 2017-12-26 NOTE — Progress Notes (Signed)
ANTICOAGULATION CONSULT NOTE   Pharmacy Consult for heparin Indication: chest pain/ACS  Allergies  Allergen Reactions  . Propoxyphene N-Acetaminophen Nausea Only    REACTION: GI upset    Patient Measurements: Height: 5\' 10"  (177.8 cm) Weight: 159 lb 14.4 oz (72.5 kg) IBW/kg (Calculated) : 73 Heparin Dosing Weight: 70.9  Vital Signs: Temp: 98.2 F (36.8 C) (07/14 1321) Temp Source: Oral (07/14 1321) BP: 129/81 (07/14 1321) Pulse Rate: 71 (07/14 1321)  Labs: Recent Labs    12/25/17 0839 12/25/17 1412 12/25/17 2021 12/26/17 0342 12/26/17 1145 12/26/17 1815  HGB 17.5* 16.1  --  14.3  --   --   HCT 53.5* 48.3  --  43.8  --   --   PLT 353 299  --  296  --   --   HEPARINUNFRC  --   --   --  0.26* 0.22* 0.28*  CREATININE 1.20 1.12  --   --   --   --   TROPONINI <0.03 1.96* 16.14* 12.27*  --   --     Estimated Creatinine Clearance: 75.5 mL/min (by C-G formula based on SCr of 1.12 mg/dL).   Medical History: Past Medical History:  Diagnosis Date  . Cocaine abuse (HCC)   . H/O ventricular fibrillation 2016   s/p cocaine use  . High cholesterol   . Hypertension   . MI (mitral incompetence)   . Myocardial infarction [facility]) 2016   "related to cocaine"  . Nonobstructive atherosclerosis of coronary artery 2016   LHC showed normal EF and mild non-obstructive CAD    Medications:  Medications Prior to Admission  Medication Sig Dispense Refill Last Dose  . amLODipine (NORVASC) 5 MG tablet Take 1 tablet (5 mg total) by mouth daily. 30 tablet 3 12/23/2017 at Unknown time  . aspirin EC 81 MG tablet Take 1 tablet (81 mg total) by mouth daily. 30 tablet 0 12/24/2017 at Unknown time  . isosorbide mononitrate (IMDUR) 30 MG 24 hr tablet Take 1 tablet (30 mg total) by mouth daily. 30 tablet 2 12/24/2017 at Unknown time  . lidocaine (LIDODERM) 5 % Place 1 patch onto the skin daily. Remove & Discard patch within 12 hours or as directed by MD 30 patch 0 Past Week at Unknown time  .  losartan (COZAAR) 25 MG tablet Take 1 tablet (25 mg total) by mouth daily. 30 tablet 2 12/24/2017 at Unknown time  . nitroGLYCERIN (NITROSTAT) 0.4 MG SL tablet Place 1 tablet (0.4 mg total) under the tongue every 5 (five) minutes as needed for chest pain (for 3 doses). 30 tablet 0 ukn  . simvastatin (ZOCOR) 20 MG tablet Take 1 tablet (20 mg total) by mouth daily at 6 PM. 30 tablet 2 12/23/2017    Assessment: 57 YO M to start heparin for CP.  He was not on anticoagulation PTA.  Received Lovenox 40 mg sq x1. Pt scheduled for cath Monday.   Heparin level came back subtherapeutic at 0.28 despite rate increase to 1100 units/hr. CBC stable, Scr 1.12, no reports of bleeding, no infusion issues per conversation with RN.  Goal of Therapy:  Heparin level 0.3-0.7 units/ml Monitor platelets by anticoagulation protocol: Yes   Plan:  Increase heparin drip to 1200 units/hr Check heparin level 6 hours after adjustment Daily HL and CBC while on heparin Monitor for bleeding complications F/u plans post cath  Girard Cooter, PharmD Clinical Pharmacist  Pager: [phone] Phone: [phone] 12/26/2017 7:05 PM

## 2017-12-26 NOTE — Consult Note (Addendum)
Cardiology Consultation:   Patient ID: Bryce Ortega; [id number]; [birth year]   Admit date: 12/25/2017 Date of Consult: 12/26/2017  Primary Care Provider: Lavinia Sharps, NP Primary Cardiologist: Lesleigh Noe, MD (saw in hospital in Jan 2019) Primary Electrophysiologist:  n/a   Patient Profile:   Bryce Ortega is a 57 y.o. male with a hx of cocaine abuse, nonobstructive coronary disease, hypertension, ventricular fibrillation arrest in the setting of cocaine use in 2016 and hyperlipidemia who is being seen today for the evaluation of non-ST elevation myocardial infarction at the request of Brett Canales, MD.  History of Present Illness:   [name] has a history of cocaine abuse and has had several ED visits related to chest pain attributed to cocaine use.  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  Cardiac catheterization at that time demonstrated nonobstructive coronary disease.  He has had no outpatient follow-up with cardiology.  He was last seen by our service in January 2019 during admission with chest pain and minimally elevated troponin levels.  UDS was positive for cocaine at that time.  Chest discomfort that time was felt to be related to vasospasm from cocaine.  He presented to the hospital yesterday with sudden onset of substernal chest discomfort with associated shortness of breath.  He tells me that he used cocaine last 3 days ago.  His pain is improved now.  He is currently on IV heparin and IV nitroglycerin.  Prior to admission, he had no difficulty with exertional chest pain or shortness of breath.  He denies syncope, paroxysmal nocturnal dyspnea, lower extremity edema.  Initial troponin was negative but increased to 1.96 and peaked at 16.14.  Most recent troponin was 12.27.  Echocardiogram 05/18/2017 Study Conclusions  - Left ventricle: The cavity size was normal. Wall thickness was   increased in a pattern of mild LVH. There was focal  basal   hypertrophy. Systolic function was normal. The estimated ejection   fraction was in the range of 50% to 55%. Wall motion was normal;   there were no regional wall motion abnormalities. Left   ventricular diastolic function parameters were normal.  Cardiac catheterization 05/10/2015 LM normal LAD distal 30 LCx normal RCA mid 40 EF 55-65  Past Medical History:  Diagnosis Date  . Cocaine abuse (HCC)   . H/O ventricular fibrillation 2016   s/p cocaine use  . High cholesterol   . Hypertension   . MI (mitral incompetence)   . Myocardial infarction [facility]) 2016   "related to cocaine"  . Nonobstructive atherosclerosis of coronary artery 2016   LHC showed normal EF and mild non-obstructive CAD    Past Surgical History:  Procedure Laterality Date  . CARDIAC CATHETERIZATION N/A 05/10/2015   Procedure: Left Heart Cath and Coronary Angiography;  Surgeon: Dolores Patty, MD;  Location: [facility] INVASIVE CV LAB;  Service: Cardiovascular;  Laterality: N/A;  . TONSILLECTOMY       Home Medications:  Prior to Admission medications   Medication Sig Start Date End Date Taking? Authorizing Provider  amLODipine (NORVASC) 5 MG tablet Take 1 tablet (5 mg total) by mouth daily. 07/07/17  Yes Rama, Maryruth Bun, MD  aspirin EC 81 MG tablet Take 1 tablet (81 mg total) by mouth daily. 05/11/15  Yes Ghimire, Werner Lean, MD  isosorbide mononitrate (IMDUR) 30 MG 24 hr tablet Take 1 tablet (30 mg total) by mouth daily. 07/07/17  Yes Rama, Maryruth Bun, MD  lidocaine (LIDODERM) 5 %  Place 1 patch onto the skin daily. Remove & Discard patch within 12 hours or as directed by MD 09/30/17  Yes Petrucelli, Samantha R, PA-C  losartan (COZAAR) 25 MG tablet Take 1 tablet (25 mg total) by mouth daily. 07/07/17  Yes Rama, Maryruth Bun, MD  nitroGLYCERIN (NITROSTAT) 0.4 MG SL tablet Place 1 tablet (0.4 mg total) under the tongue every 5 (five) minutes as needed for chest pain (for 3 doses). 07/07/17   Rama, Maryruth Bun, MD    simvastatin (ZOCOR) 20 MG tablet Take 1 tablet (20 mg total) by mouth daily at 6 PM. 07/07/17   Rama, Maryruth Bun, MD    Inpatient Medications: Scheduled Meds: . amLODipine  5 mg Oral Daily  . aspirin EC  81 mg Oral Daily  . lidocaine  1 patch Transdermal Q24H  . losartan  25 mg Oral Daily   Continuous Infusions: . heparin 950 Units/hr (12/26/17 0512)  . nitroGLYCERIN 5 mcg/min (12/25/17 2300)   PRN Meds: acetaminophen, ALPRAZolam, gi cocktail, ketorolac, nitroGLYCERIN, ondansetron (ZOFRAN) IV, zolpidem  Allergies:    Allergies  Allergen Reactions  . Propoxyphene N-Acetaminophen Nausea Only    REACTION: GI upset    Social History:   Social History   Socioeconomic History  . Marital status: Divorced    Spouse name: Not on file  . Number of children: Not on file  . Years of education: Not on file  . Highest education level: Not on file  Occupational History  . Not on file  Social Needs  . Financial resource strain: Not on file  . Food insecurity:    Worry: Not on file    Inability: Not on file  . Transportation needs:    Medical: Not on file    Non-medical: Not on file  Tobacco Use  . Smoking status: Never Smoker  . Smokeless tobacco: Never Used  Substance and Sexual Activity  . Alcohol use: Yes    Alcohol/week: 7.8 oz    Types: 13 Cans of beer per week    Comment: 07/06/2017 "~ 2 40 oz beers 2x/week"  . Drug use: Yes    Types: Cocaine    Comment: 07/06/2017 "weekly"  . Sexual activity: Yes  Lifestyle  . Physical activity:    Days per week: Not on file    Minutes per session: Not on file  . Stress: Not on file  Relationships  . Social connections:    Talks on phone: Not on file    Gets together: Not on file    Attends religious service: Not on file    Active member of club or organization: Not on file    Attends meetings of clubs or organizations: Not on file    Relationship status: Not on file  . Intimate partner violence:    Fear of current or ex  partner: Not on file    Emotionally abused: Not on file    Physically abused: Not on file    Forced sexual activity: Not on file  Other Topics Concern  . Not on file  Social History Narrative   ** Merged History Encounter **        Family History:    Family History  Problem Relation Age of Onset  . Hypertension Father   . CAD Father        s/p CABG  . Heart disease Mother   . Heart disease Maternal Grandfather      ROS:  Please see the history of present illness.  He denies fevers, chills, cough, vomiting, diarrhea, melena, hematochezia. All other ROS reviewed and negative.     Physical Exam/Data:   Vitals:   12/25/17 1931 12/25/17 2140 12/26/17 0438 12/26/17 0727  BP: 105/60 115/69 120/81 130/89  Pulse: 72 70 66   Resp: 17  16   Temp: 98.7 F (37.1 C)  97.8 F (36.6 C)   TempSrc: Oral  Oral   SpO2: 96%  96%   Weight:   159 lb 14.4 oz (72.5 kg)   Height:        Intake/Output Summary (Last 24 hours) at 12/26/2017 0959 Last data filed at 12/26/2017 0512 Gross per 24 hour  Intake 746 ml  Output 1260 ml  Net -514 ml   Filed Weights   12/25/17 1107 12/25/17 1350 12/26/17 0438  Weight: 162 lb (73.5 kg) 156 lb 3.2 oz (70.9 kg) 159 lb 14.4 oz (72.5 kg)   Body mass index is 22.94 kg/m.  General:  Well nourished, well developed, in no acute distress  HEENT: normal Lymph: no adenopathy Neck: no JVD Endocrine:  No thryomegaly Vascular: No carotid bruits   Cardiac:  normal S1, S2; RRR; no murmur   Lungs:  clear to auscultation bilaterally, no wheezing, rhonchi or rales  Abd: soft, nontender   Ext: no edema Musculoskeletal:  No deformities  Skin: warm and dry  Neuro:  CNs 2-12 intact, no focal abnormalities noted Psych:  Normal affect   EKG:  The EKG was personally reviewed and demonstrates:   12/25/2017:?  Ectopic atrial rhythm with heart rate 99, right bundle branch block, deep inferolateral ST depression 12/26/2017: Normal sinus rhythm, heart rate 63, normal  axis, QTC 435  Telemetry:  Telemetry was personally reviewed and demonstrates:  NSR, occ 3-4 beats wide complex tachy (NSVT vs aberrancy)   Relevant CV Studies: None since admission   Laboratory Data:  Chemistry Recent Labs  Lab 12/20/17 0227 12/25/17 0839 12/25/17 1412  NA 140 140  --   K 3.7 4.2  --   CL 105 107  --   CO2 23 23  --   GLUCOSE 144* 106*  --   BUN 19 17  --   CREATININE 1.08 1.20 1.12  CALCIUM 8.7* 8.3*  --   GFRNONAA >60 >60 >60  GFRAA >60 >60 >60  ANIONGAP 12 10  --     No results for input(s): PROT, ALBUMIN, AST, ALT, ALKPHOS, BILITOT in the last 168 hours. Hematology Recent Labs  Lab 12/25/17 0839 12/25/17 1412 12/26/17 0342  WBC 9.7 10.3 8.7  RBC 5.86* 5.34 4.74  HGB 17.5* 16.1 14.3  HCT 53.5* 48.3 43.8  MCV 91.3 90.4 92.4  MCH 29.9 30.1 30.2  MCHC 32.7 33.3 32.6  RDW 13.2 13.2 13.4  PLT 353 299 296   Cardiac Enzymes Recent Labs  Lab 12/25/17 0839 12/25/17 1412 12/25/17 2021 12/26/17 0342  TROPONINI <0.03 1.96* 16.14* 12.27*    Recent Labs  Lab 12/20/17 0256 12/20/17 0619 12/25/17 0847  TROPIPOC 0.00 0.01 0.00    BNP Recent Labs  Lab 12/25/17 0839  BNP 11.3    DDimer No results for input(s): DDIMER in the last 168 hours.   Lab Results  Component Value Date   COCAINSCRNUR POSITIVE (A) 12/25/2017    Radiology/Studies:  Dg Chest 2 View  Result Date: 12/25/2017 CLINICAL DATA:  Central chest pain.  Shortness of breath. EXAM: CHEST - 2 VIEW COMPARISON:  December 20, 2017 FINDINGS: The heart size and mediastinal contours are  within normal limits. Both lungs are clear. The visualized skeletal structures are unremarkable. IMPRESSION: No active cardiopulmonary disease. Electronically Signed   By: Gerome Sam III M.D   On: 12/25/2017 08:58    Assessment and Plan:   1. Non-ST Elevation Myocardial Infarction He presents with chest pain starting 3 days after using cocaine.  His Troponin levels have peaked at 16.  His ECG  yesterday demonstrated diffuse ST depression.  His pain is better on IV NTG.  He is still having some discomfort and describes his pain as "1/10."  I suspect his myocardial infarction was induced by coronary vasospasm in the setting of cocaine abuse.  However, he will need a Cardiac Catheterization to rule out obstructive coronary artery disease.  This will be scheduled for tomorrow, unless he has worsening symptoms that cannot be managed.    - Continue ASA, Amlodipine, IV NTG, IV Heparin  - Change low dose Simvastatin to Atorvastatin 80 mg QD  - No beta-blocker due to recent cocaine use  - Plan Cardiac Catheterization tomorrow (7/15)  - Arrange echocardiogram   2. Cocaine Use We discussed the dangers of continued substance abuse, including death.  He is agreeable to discussing possible outpatient treatment programs with a Child psychotherapist.    3. Hypertension  Fair control.  Continue Amlodipine, Losartan.  4. Hyperlipidemia As noted, low dose Simvastatin will be changed to high intensity statin (Atorvastatin 80).    For questions or updates, please contact [facility] Please consult [email or web address] for contact info under Cardiology/STEMI.   Signed, Tereso Newcomer, PA-C  12/26/2017 9:59 AM    Attending note:  Patient seen and examined.  I reviewed his records and discussed the case with Mr. Huntley Dec.  Mr. Weitzner has a history of nonobstructive CAD documented at cardiac catheterization back in 2016 when he presented with VF arrest in the setting of cocaine induced vasospasm.  He is currently admitted to the hospital reporting cocaine use 3 days ago and recent onset chest discomfort.  Troponin I levels have increased from 1.96 up to 16.14 although he reports improvement in chest pain on combination of intravenous nitroglycerin and heparin.  ECG shows nonspecific ST segment changes, he did have an accelerated idioventricular rhythm noted yesterday.  On examination this morning he appears  comfortable.  Heart rate is in the 60s in sinus rhythm by telemetry.  Systolic blood pressure steady in the 120s to 130s.  Lungs are clear without labored breathing.  Cardiac exam reveals RRR without gallop.  Lab work shows troponin I down from 16.14 to 12.27, hemoglobin 14.3, platelets 296, potassium 4.2, BUN 17, creatinine 1.2.  Chest x-ray reports no acute process.  Patient presents with NSTEMI in the setting of recent cocaine use.  He has previously documented nonobstructive CAD as of 2016.  Current troponin I trend does not exclude a plaque rupture event, however certainly recurrent coronary vasospasm is certainly likely.  Would continue aspirin, Norvasc, Cozaar, along with intravenous heparin and nitroglycerin.  We have discussed a follow-up diagnostic cardiac catheterization to reassess coronary anatomy, although the bigger picture here is complete cessation of cocaine use.  Patient has not maintained outpatient cardiac follow-up, and this will need to be carefully considered by the interventional cardiologist performing the procedure in case PCI is performed.  Jonelle Sidle, M.D., F.A.C.C.

## 2017-12-26 NOTE — Plan of Care (Signed)
  Problem: Clinical Measurements: Goal: Will remain free from infection Outcome: Progressing Note:  No s/s of infection noted. Goal: Respiratory complications will improve Outcome: Progressing Note:  No s/s of respiratory complications noted. Goal: Cardiovascular complication will be avoided Outcome: Progressing Note:  No s/s of cardiovascular complications noted.   Problem: Pain Managment: Goal: General experience of comfort will improve Outcome: Progressing Note:  Denies c/o pain or discomfort.  NTG gtt infusing.

## 2017-12-26 NOTE — Progress Notes (Addendum)
Received pg from RN concerning Troponin at 16.17. Bedside assessment performed. Pt c/o of minor CP. VSS. Pt to continue with aspirin, cardiac meds and nitro gtt. ECG ordered. Pt placed on heparin gtt as well.  Cardiology paged and consulted.   Audrea Muscat, NP [facility] 7p-7a

## 2017-12-26 NOTE — Progress Notes (Signed)
ANTICOAGULATION CONSULT NOTE   Pharmacy Consult for heparin Indication: chest pain/ACS  Allergies  Allergen Reactions   Propoxyphene N-Acetaminophen Nausea Only    REACTION: GI upset    Patient Measurements: Height: 5\' 10"  (177.8 cm) Weight: 159 lb 14.4 oz (72.5 kg) IBW/kg (Calculated) : 73 Heparin Dosing Weight: 70.9  Vital Signs: Temp: 97.8 F (36.6 C) (07/14 0438) Temp Source: Oral (07/14 0438) BP: 130/89 (07/14 0727) Pulse Rate: 66 (07/14 0438)  Labs: Recent Labs    12/25/17 0839 12/25/17 1412 12/25/17 2021 12/26/17 0342 12/26/17 1145  HGB 17.5* 16.1  --  14.3  --   HCT 53.5* 48.3  --  43.8  --   PLT 353 299  --  296  --   HEPARINUNFRC  --   --   --  0.26* 0.22*  CREATININE 1.20 1.12  --   --   --   TROPONINI <0.03 1.96* 16.14* 12.27*  --     Estimated Creatinine Clearance: 75.5 mL/min (by C-G formula based on SCr of 1.12 mg/dL).   Medical History: Past Medical History:  Diagnosis Date   Cocaine abuse (HCC)    H/O ventricular fibrillation 2016   s/p cocaine use   High cholesterol    Hypertension    MI (mitral incompetence)    Myocardial infarction (HCC) 2016   "related to cocaine"   Nonobstructive atherosclerosis of coronary artery 2016   LHC showed normal EF and mild non-obstructive CAD    Medications:  Medications Prior to Admission  Medication Sig Dispense Refill Last Dose   amLODipine (NORVASC) 5 MG tablet Take 1 tablet (5 mg total) by mouth daily. 30 tablet 3 12/23/2017 at Unknown time   aspirin EC 81 MG tablet Take 1 tablet (81 mg total) by mouth daily. 30 tablet 0 12/24/2017 at Unknown time   isosorbide mononitrate (IMDUR) 30 MG 24 hr tablet Take 1 tablet (30 mg total) by mouth daily. 30 tablet 2 12/24/2017 at Unknown time   lidocaine (LIDODERM) 5 % Place 1 patch onto the skin daily. Remove & Discard patch within 12 hours or as directed by MD 30 patch 0 Past Week at Unknown time   losartan (COZAAR) 25 MG tablet Take 1 tablet (25 mg  total) by mouth daily. 30 tablet 2 12/24/2017 at Unknown time   nitroGLYCERIN (NITROSTAT) 0.4 MG SL tablet Place 1 tablet (0.4 mg total) under the tongue every 5 (five) minutes as needed for chest pain (for 3 doses). 30 tablet 0 ukn   simvastatin (ZOCOR) 20 MG tablet Take 1 tablet (20 mg total) by mouth daily at 6 PM. 30 tablet 2 12/23/2017    Assessment: 57 YO M to start heparin for CP.  He was not on anticoagulation PTA.  Received Lovenox 40 mg sq x1. Pt scheduled for cath Monday.   Heparin level came back subtherapeutic at 0.22 on 950 units/hr. CBC stable, Scr 1.12, no reports of bleeding, no infusion issues per conversation with RN.  Goal of Therapy:  Heparin level 0.3-0.7 units/ml Monitor platelets by anticoagulation protocol: Yes   Plan:  Increase heparin drip to 1100 units/hr Check heparin level 6 hours after adjustment Daily HL and CBC while on heparin Monitor for bleeding complications F/u plans post cath   Lenward Chancellor, PharmD PGY1 Pharmacy Resident Phone [phone] 12/26/2017 1:22 PM

## 2017-12-27 ENCOUNTER — Inpatient Hospital Stay (HOSPITAL_COMMUNITY): Payer: Self-pay

## 2017-12-27 ENCOUNTER — Encounter (HOSPITAL_COMMUNITY)
Admission: EM | Disposition: A | Discharge status: Home / Self Care | Payer: Self-pay | Source: Home / Self Care | Attending: Internal Medicine

## 2017-12-27 ENCOUNTER — Encounter (HOSPITAL_COMMUNITY): Payer: Self-pay | Admitting: Interventional Cardiology

## 2017-12-27 DIAGNOSIS — R072 Precordial pain: Secondary | ICD-10-CM

## 2017-12-27 DIAGNOSIS — I5030 Unspecified diastolic (congestive) heart failure: Secondary | ICD-10-CM

## 2017-12-27 DIAGNOSIS — I2510 Atherosclerotic heart disease of native coronary artery without angina pectoris: Secondary | ICD-10-CM

## 2017-12-27 DIAGNOSIS — E782 Mixed hyperlipidemia: Secondary | ICD-10-CM

## 2017-12-27 HISTORY — PX: LEFT HEART CATH AND CORONARY ANGIOGRAPHY: CATH118249

## 2017-12-27 LAB — BASIC METABOLIC PANEL
ANION GAP: 7 (ref 5–15)
BUN: 14 mg/dL (ref 6–20)
CALCIUM: 8.4 mg/dL — AB (ref 8.9–10.3)
CO2: 24 mmol/L (ref 22–32)
Chloride: 110 mmol/L (ref 98–111)
Creatinine, Ser: 1.19 mg/dL (ref 0.61–1.24)
GLUCOSE: 84 mg/dL (ref 70–99)
POTASSIUM: 4.4 mmol/L (ref 3.5–5.1)
SODIUM: 141 mmol/L (ref 135–145)

## 2017-12-27 LAB — PROTIME-INR
INR: 0.9
Prothrombin Time: 12 seconds (ref 11.4–15.2)

## 2017-12-27 LAB — CBC
HEMATOCRIT: 45.6 % (ref 39.0–52.0)
HEMOGLOBIN: 15.3 g/dL (ref 13.0–17.0)
MCH: 30.7 pg (ref 26.0–34.0)
MCHC: 33.6 g/dL (ref 30.0–36.0)
MCV: 91.4 fL (ref 78.0–100.0)
Platelets: 287 10*3/uL (ref 150–400)
RBC: 4.99 MIL/uL (ref 4.22–5.81)
RDW: 13.2 % (ref 11.5–15.5)
WBC: 8.2 10*3/uL (ref 4.0–10.5)

## 2017-12-27 LAB — ECHOCARDIOGRAM COMPLETE
Height: 70 in
Weight: 2520 oz

## 2017-12-27 LAB — HEPARIN LEVEL (UNFRACTIONATED)
HEPARIN UNFRACTIONATED: 0.36 [IU]/mL (ref 0.30–0.70)
Heparin Unfractionated: 0.48 IU/mL (ref 0.30–0.70)

## 2017-12-27 SURGERY — LEFT HEART CATH AND CORONARY ANGIOGRAPHY
Anesthesia: LOCAL

## 2017-12-27 MED ORDER — HEPARIN SODIUM (PORCINE) 1000 UNIT/ML IJ SOLN
INTRAMUSCULAR | Status: AC
  Filled 2017-12-27: qty 1

## 2017-12-27 MED ORDER — LIDOCAINE HCL (PF) 1 % IJ SOLN
INTRAMUSCULAR | Status: AC
  Filled 2017-12-27: qty 30

## 2017-12-27 MED ORDER — ONDANSETRON HCL 4 MG/2ML IJ SOLN: 4.0000 mg | Freq: Four times a day (QID) | INTRAMUSCULAR | Status: DC | PRN

## 2017-12-27 MED ORDER — MIDAZOLAM HCL 2 MG/2ML IJ SOLN
INTRAMUSCULAR | Status: DC | PRN
  Administered 2017-12-27: 1 mg via INTRAVENOUS

## 2017-12-27 MED ORDER — SODIUM CHLORIDE 0.9% FLUSH
3.0000 mL | Freq: Two times a day (BID) | INTRAVENOUS | Status: DC
  Administered 2017-12-27: 3 mL via INTRAVENOUS

## 2017-12-27 MED ORDER — HYDRALAZINE HCL 20 MG/ML IJ SOLN: 5.0000 mg | INTRAMUSCULAR | Status: AC | PRN

## 2017-12-27 MED ORDER — CLOPIDOGREL BISULFATE 75 MG PO TABS
75.0000 mg | ORAL_TABLET | Freq: Every day | ORAL | Status: DC
  Administered 2017-12-28: 75 mg via ORAL
  Filled 2017-12-27: qty 1

## 2017-12-27 MED ORDER — OXYCODONE HCL 5 MG PO TABS: 5.0000 mg | ORAL_TABLET | ORAL | Status: DC | PRN

## 2017-12-27 MED ORDER — SODIUM CHLORIDE 0.9 % IV SOLN: 250.0000 mL | INTRAVENOUS | Status: DC | PRN

## 2017-12-27 MED ORDER — ACETAMINOPHEN 325 MG PO TABS: 650.0000 mg | ORAL_TABLET | ORAL | Status: DC | PRN

## 2017-12-27 MED ORDER — HEPARIN SODIUM (PORCINE) 1000 UNIT/ML IJ SOLN
INTRAMUSCULAR | Status: DC | PRN
  Administered 2017-12-27: 3500 [IU] via INTRAVENOUS

## 2017-12-27 MED ORDER — FENTANYL CITRATE (PF) 100 MCG/2ML IJ SOLN
INTRAMUSCULAR | Status: DC | PRN
  Administered 2017-12-27: 50 ug via INTRAVENOUS

## 2017-12-27 MED ORDER — IOHEXOL 350 MG/ML SOLN
INTRAVENOUS | Status: DC | PRN
  Administered 2017-12-27: 80 mL

## 2017-12-27 MED ORDER — HEPARIN (PORCINE) IN NACL 1000-0.9 UT/500ML-% IV SOLN
INTRAVENOUS | Status: AC
  Filled 2017-12-27: qty 1000

## 2017-12-27 MED ORDER — VERAPAMIL HCL 2.5 MG/ML IV SOLN
INTRAVENOUS | Status: DC | PRN
  Administered 2017-12-27: 10 mL via INTRA_ARTERIAL

## 2017-12-27 MED ORDER — SODIUM CHLORIDE 0.9% FLUSH: 3.0000 mL | INTRAVENOUS | Status: DC | PRN

## 2017-12-27 MED ORDER — ENOXAPARIN SODIUM 40 MG/0.4ML ~~LOC~~ SOLN: 40.0000 mg | SUBCUTANEOUS | Status: DC

## 2017-12-27 MED ORDER — SODIUM CHLORIDE 0.9 % IV SOLN
INTRAVENOUS | Status: AC
  Administered 2017-12-27: 14:00:00 via INTRAVENOUS

## 2017-12-27 MED ORDER — LIDOCAINE HCL (PF) 1 % IJ SOLN
INTRAMUSCULAR | Status: DC | PRN
  Administered 2017-12-27: 2 mL

## 2017-12-27 MED ORDER — MIDAZOLAM HCL 2 MG/2ML IJ SOLN
INTRAMUSCULAR | Status: AC
  Filled 2017-12-27: qty 2

## 2017-12-27 MED ORDER — HEPARIN (PORCINE) IN NACL 1000-0.9 UT/500ML-% IV SOLN
INTRAVENOUS | Status: DC | PRN
  Administered 2017-12-27 (×2): 500 mL

## 2017-12-27 MED ORDER — FENTANYL CITRATE (PF) 100 MCG/2ML IJ SOLN
INTRAMUSCULAR | Status: AC
  Filled 2017-12-27: qty 2

## 2017-12-27 MED ORDER — NITROGLYCERIN 1 MG/10 ML FOR IR/CATH LAB
INTRA_ARTERIAL | Status: AC
  Filled 2017-12-27: qty 10

## 2017-12-27 MED ORDER — NITROGLYCERIN 1 MG/10 ML FOR IR/CATH LAB
INTRA_ARTERIAL | Status: DC | PRN
  Administered 2017-12-27: 200 ug via INTRACORONARY

## 2017-12-27 MED ORDER — ASPIRIN 81 MG PO CHEW
81.0000 mg | CHEWABLE_TABLET | Freq: Every day | ORAL | Status: DC
  Administered 2017-12-28: 81 mg via ORAL
  Filled 2017-12-27: qty 1

## 2017-12-27 MED ORDER — VERAPAMIL HCL 2.5 MG/ML IV SOLN
INTRAVENOUS | Status: AC
  Filled 2017-12-27: qty 2

## 2017-12-27 SURGICAL SUPPLY — 9 items

## 2017-12-27 NOTE — Interval H&P Note (Signed)
Cath Lab Visit (complete for each Cath Lab visit)  Clinical Evaluation Leading to the Procedure:   ACS: Yes.    Non-ACS:    Anginal Classification: CCS III  Anti-ischemic medical therapy: No Therapy  Non-Invasive Test Results: No non-invasive testing performed  Prior CABG: No previous CABG      History and Physical Interval Note:  12/27/2017 10:21 AM  Bryce Ortega  has presented today for surgery, with the diagnosis of NSTEMI  The various methods of treatment have been discussed with the patient and family. After consideration of risks, benefits and other options for treatment, the patient has consented to  Procedure(s): LEFT HEART CATH AND CORONARY ANGIOGRAPHY (N/A) as a surgical intervention .  The patient's history has been reviewed, patient examined, no change in status, stable for surgery.  I have reviewed the patient's chart and labs.  Questions were answered to the patient's satisfaction.     Lyn Records III

## 2017-12-27 NOTE — Progress Notes (Addendum)
ANTICOAGULATION CONSULT NOTE   Pharmacy Consult for heparin Indication: chest pain/ACS  Allergies  Allergen Reactions  . Propoxyphene N-Acetaminophen Nausea Only    REACTION: GI upset    Patient Measurements: Height: 5\' 10"  (177.8 cm) Weight: 157 lb 8 oz (71.4 kg) IBW/kg (Calculated) : 73 Heparin Dosing Weight: 70.9  Vital Signs: Temp: 98.2 F (36.8 C) (07/15 0603) Temp Source: Oral (07/15 0603) BP: 132/98 (07/15 0927) Pulse Rate: 54 (07/15 0603)  Labs: Recent Labs    12/25/17 0839 12/25/17 1412 12/25/17 2021 12/26/17 0342  12/26/17 1815 12/27/17 0148 12/27/17 0913  HGB 17.5* 16.1  --  14.3  --   --  15.3  --   HCT 53.5* 48.3  --  43.8  --   --  45.6  --   PLT 353 299  --  296  --   --  287  --   LABPROT  --   --   --   --   --   --  12.0  --   INR  --   --   --   --   --   --  0.90  --   HEPARINUNFRC  --   --   --  0.26*   < > 0.28* 0.36 0.48  CREATININE 1.20 1.12  --   --   --  1.19  --   --   TROPONINI <0.03 1.96* 16.14* 12.27*  --   --   --   --    < > = values in this interval not displayed.    Estimated Creatinine Clearance: 70 mL/min (by C-G formula based on SCr of 1.19 mg/dL).   Medical History: Past Medical History:  Diagnosis Date  . Cocaine abuse (HCC)   . H/O ventricular fibrillation 2016   s/p cocaine use  . High cholesterol   . Hypertension   . MI (mitral incompetence)   . Myocardial infarction [facility]) 2016   "related to cocaine"  . Nonobstructive atherosclerosis of coronary artery 2016   LHC showed normal EF and mild non-obstructive CAD    Medications:  Medications Prior to Admission  Medication Sig Dispense Refill Last Dose  . amLODipine (NORVASC) 5 MG tablet Take 1 tablet (5 mg total) by mouth daily. 30 tablet 3 12/23/2017 at Unknown time  . aspirin EC 81 MG tablet Take 1 tablet (81 mg total) by mouth daily. 30 tablet 0 12/24/2017 at Unknown time  . isosorbide mononitrate (IMDUR) 30 MG 24 hr tablet Take 1 tablet (30 mg total) by mouth  daily. 30 tablet 2 12/24/2017 at Unknown time  . lidocaine (LIDODERM) 5 % Place 1 patch onto the skin daily. Remove & Discard patch within 12 hours or as directed by MD 30 patch 0 Past Week at Unknown time  . losartan (COZAAR) 25 MG tablet Take 1 tablet (25 mg total) by mouth daily. 30 tablet 2 12/24/2017 at Unknown time  . nitroGLYCERIN (NITROSTAT) 0.4 MG SL tablet Place 1 tablet (0.4 mg total) under the tongue every 5 (five) minutes as needed for chest pain (for 3 doses). 30 tablet 0 ukn  . simvastatin (ZOCOR) 20 MG tablet Take 1 tablet (20 mg total) by mouth daily at 6 PM. 30 tablet 2 12/23/2017    Assessment: 56 YO M to start heparin for CP. He was not on anticoagulation PTA. Pt scheduled for cath 7/15.   Heparin level therapeutic this AM but now stopped for cath. CBC stable. No bleed documented.  Goal of Therapy:  Heparin level 0.3-0.7 units/ml Monitor platelets by anticoagulation protocol: Yes   Plan:  Heparin off for procedure > F/u plans post-cath 7/15  Babs Bertin, PharmD, BCPS Clinical Pharmacist Clinical phone [phone] Please check AMION for all [facility] contact numbers 12/27/2017 9:49 AM

## 2017-12-27 NOTE — Progress Notes (Signed)
PROGRESS NOTE  Bryce Ortega:[id number] DOB: 1961/01/21 DOA: 12/25/2017 PCP: Lavinia Sharps, NP   LOS: 1 day   Brief Narrative / Interim history: 57 yo M with history of, medical nonadherence, cocaine abuse with multiple admissions for cocaine related chest pain, cardiac arrest in 2016 in the setting of cane use, also underwent a cardiac cath in 2016 which showed nonobstructive coronary artery disease, is admitted to the hospital with chest pain.  Admits to cocaine use.  He was found to have an elevated troponin, EKG changes, and cardiology was consulted  Assessment & Plan: Principal Problem:   Chest pain Active Problems:   Essential hypertension   Hyperlipidemia   Cocaine abuse (HCC)   NSTEMI -Consulted cardiology, troponin went up to 16 and now improving, but patient is still having chest pain.  He is on a heparin infusion as well as nitroglycerin.  Closely monitor for bleeding. - Cardiac catheterization today without significant obstruction   Widely patent coronary arteries.  Coronary anatomy.  Left main is normal.  The LAD contains 30 to 40% mid eccentric narrowing after the second diagonal.  The LAD does not wraparound the apex.  The distal segment demonstrates some evidence of systolic compression.  The circumflex is widely patent.  The second and third obtuse marginals contained 40% ostial narrowing.  Dominant vessel with eccentric 30% mid vessel narrowing.  Non-ST elevation myocardial infarction affecting the anterolateral wall, possibly related to coronary vasospasm in the setting of cocaine use versus atypical Takotsubo stress cardiomyopathy.  RECOMMENDATIONS:   Risk factor modification: Cocaine, blood pressure, glycemic, lipid, and tobacco (if any) control.    Frankly discussed the hazards of continued cocaine use given prior VF arrest and non-ST elevation MI on this occasion with wall motion abnormality.  Discontinue IV nitroglycerin.  Consider dual  antiplatelet therapy for 6-12 months  Cocaine abuse -Counseled for cessation, Child psychotherapist consulted  Hypertension -BP controlled, continue current regimen  Hyperlipidemia -On statin   DVT prophylaxis: Heparin infusion Code Status: Full code Family Communication: no family at bedside Disposition Plan: home when ready, likely 1 day  Consultants:   Cardiology   Procedures:   2D echo: pending  Antimicrobials:  None    Subjective: -No longer has chest pain, awaiting cath  Objective: Vitals:   12/27/17 1038 12/27/17 1100 12/27/17 1131 12/27/17 1201  BP: 130/89 (!) 134/103 (!) 128/94 (!) 129/92  Pulse: 67     Resp: 14  12 16   Temp:  98 F (36.7 C)    TempSrc:  Oral    SpO2: 97% 97%    Weight:      Height:        Intake/Output Summary (Last 24 hours) at 12/27/2017 1526 Last data filed at 12/27/2017 1400 Gross per 24 hour  Intake 1358.17 ml  Output 3425 ml  Net -2066.83 ml   Filed Weights   12/25/17 1350 12/26/17 0438 12/27/17 0500  Weight: 70.9 kg (156 lb 3.2 oz) 72.5 kg (159 lb 14.4 oz) 71.4 kg (157 lb 8 oz)    Examination:  Constitutional: No distress Eyes: No scleral icterus ENMT: Moist mucous membranes Respiratory: CTA biL, no wheezing  Cardiovascular: RRR, no murmurs, no edema  Abdomen: Soft, nontender, nondistended, positive bowel sounds Skin: No rashes seen Neurologic: Nonfocal, equal strength   Data Reviewed: I have independently reviewed following labs and imaging studies   CBC: Recent Labs  Lab 12/25/17 0839 12/25/17 1412 12/26/17 0342 12/27/17 0148  WBC 9.7 10.3 8.7 8.2  HGB 17.5* 16.1 14.3 15.3  HCT 53.5* 48.3 43.8 45.6  MCV 91.3 90.4 92.4 91.4  PLT 353 299 296 287   Basic Metabolic Panel: Recent Labs  Lab 12/25/17 0839 12/25/17 1412 12/26/17 1815  NA 140  --  141  K 4.2  --  4.4  CL 107  --  110  CO2 23  --  24  GLUCOSE 106*  --  84  BUN 17  --  14  CREATININE 1.20 1.12 1.19  CALCIUM 8.3*  --  8.4*  MG 2.2  --    --    GFR: Estimated Creatinine Clearance: 70 mL/min (by C-G formula based on SCr of 1.19 mg/dL). Liver Function Tests: No results for input(s): AST, ALT, ALKPHOS, BILITOT, PROT, ALBUMIN in the last 168 hours. No results for input(s): LIPASE, AMYLASE in the last 168 hours. No results for input(s): AMMONIA in the last 168 hours. Coagulation Profile: Recent Labs  Lab 12/27/17 0148  INR 0.90   Cardiac Enzymes: Recent Labs  Lab 12/25/17 0839 12/25/17 1412 12/25/17 2021 12/26/17 0342  TROPONINI <0.03 1.96* 16.14* 12.27*   BNP (last 3 results) No results for input(s): PROBNP in the last 8760 hours. HbA1C: No results for input(s): HGBA1C in the last 72 hours. CBG: No results for input(s): GLUCAP in the last 168 hours. Lipid Profile: No results for input(s): CHOL, HDL, LDLCALC, TRIG, CHOLHDL, LDLDIRECT in the last 72 hours. Thyroid Function Tests: No results for input(s): TSH, T4TOTAL, FREET4, T3FREE, THYROIDAB in the last 72 hours. Anemia Panel: No results for input(s): VITAMINB12, FOLATE, FERRITIN, TIBC, IRON, RETICCTPCT in the last 72 hours. Urine analysis: No results found for: COLORURINE, APPEARANCEUR, LABSPEC, PHURINE, GLUCOSEU, HGBUR, BILIRUBINUR, KETONESUR, PROTEINUR, UROBILINOGEN, NITRITE, LEUKOCYTESUR Sepsis Labs: Invalid input(s): PROCALCITONIN, LACTICIDVEN  No results found for this or any previous visit (from the past 240 hour(s)).    Radiology Studies: No results found.   Scheduled Meds: . amLODipine  5 mg Oral Daily  . aspirin  81 mg Oral Daily  . atorvastatin  80 mg Oral q1800  . [START ON 12/28/2017] clopidogrel  75 mg Oral Q breakfast  . [START ON 12/28/2017] enoxaparin (LOVENOX) injection  40 mg Subcutaneous Q24H  . lidocaine  1 patch Transdermal Q24H  . losartan  25 mg Oral Daily  . sodium chloride flush  3 mL Intravenous Q12H   Continuous Infusions: . sodium chloride      Pamella Pert, MD, PhD [facility] Pager [phone]  If  7PM-7AM, please contact night-coverage [email or web address] Password [facility] 12/27/2017, 3:26 PM

## 2017-12-27 NOTE — Progress Notes (Addendum)
Progress Note  Patient Name: RAY GERVASI Date of Encounter: 12/27/2017  Primary Cardiologist: Lesleigh Noe, MD   Subjective   Pt denies chest pain, on IV heparin and IV nitro  Inpatient Medications    Scheduled Meds: . amLODipine  5 mg Oral Daily  . aspirin EC  81 mg Oral Daily  . atorvastatin  80 mg Oral q1800  . lidocaine  1 patch Transdermal Q24H  . losartan  25 mg Oral Daily   Continuous Infusions: . sodium chloride 1 mL/kg/hr (12/27/17 1478)  . heparin 1,200 Units/hr (12/26/17 2215)  . nitroGLYCERIN 10 mcg/min (12/26/17 0730)   PRN Meds: acetaminophen, ALPRAZolam, gi cocktail, ketorolac, nitroGLYCERIN, ondansetron (ZOFRAN) IV, zolpidem   Vital Signs    Vitals:   12/26/17 1321 12/26/17 2126 12/27/17 0500 12/27/17 0603  BP: 129/81 134/85  117/78  Pulse: 71 67  (!) 54  Resp:      Temp: 98.2 F (36.8 C) 98.4 F (36.9 C)  98.2 F (36.8 C)  TempSrc: Oral Oral  Oral  SpO2: 96% 97%  97%  Weight:   157 lb 8 oz (71.4 kg)   Height:        Intake/Output Summary (Last 24 hours) at 12/27/2017 0716 Last data filed at 12/27/2017 0600 Gross per 24 hour  Intake 1102 ml  Output 2500 ml  Net -1398 ml   Filed Weights   12/25/17 1350 12/26/17 0438 12/27/17 0500  Weight: 156 lb 3.2 oz (70.9 kg) 159 lb 14.4 oz (72.5 kg) 157 lb 8 oz (71.4 kg)    Telemetry    sinus - Personally Reviewed  ECG    Sinus, ST depressions - Personally Reviewed  Physical Exam   GEN: No acute distress.   Neck: No JVD Cardiac: RRR, no murmurs, rubs, or gallops.  Respiratory: Clear to auscultation bilaterally. GI: Soft, nontender, non-distended  MS: No edema; No deformity. Neuro:  Nonfocal  Psych: Normal affect   Labs    Chemistry Recent Labs  Lab 12/25/17 0839 12/25/17 1412 12/26/17 1815  NA 140  --  141  K 4.2  --  4.4  CL 107  --  110  CO2 23  --  24  GLUCOSE 106*  --  84  BUN 17  --  14  CREATININE 1.20 1.12 1.19  CALCIUM 8.3*  --  8.4*  GFRNONAA >60 >60 >60   GFRAA >60 >60 >60  ANIONGAP 10  --  7     Hematology Recent Labs  Lab 12/25/17 1412 12/26/17 0342 12/27/17 0148  WBC 10.3 8.7 8.2  RBC 5.34 4.74 4.99  HGB 16.1 14.3 15.3  HCT 48.3 43.8 45.6  MCV 90.4 92.4 91.4  MCH 30.1 30.2 30.7  MCHC 33.3 32.6 33.6  RDW 13.2 13.4 13.2  PLT 299 296 287    Cardiac Enzymes Recent Labs  Lab 12/25/17 0839 12/25/17 1412 12/25/17 2021 12/26/17 0342  TROPONINI <0.03 1.96* 16.14* 12.27*    Recent Labs  Lab 12/25/17 0847  TROPIPOC 0.00     BNP Recent Labs  Lab 12/25/17 0839  BNP 11.3     DDimer No results for input(s): DDIMER in the last 168 hours.   Radiology    Dg Chest 2 View  Result Date: 12/25/2017 CLINICAL DATA:  Central chest pain.  Shortness of breath. EXAM: CHEST - 2 VIEW COMPARISON:  December 20, 2017 FINDINGS: The heart size and mediastinal contours are within normal limits. Both lungs are clear. The visualized skeletal  structures are unremarkable. IMPRESSION: No active cardiopulmonary disease. Electronically Signed   By: Gerome Sam III M.D   On: 12/25/2017 08:58    Cardiac Studies   Echo pending  Heart cath pending  Patient Profile     57 y.o. male with a hx of cocaine abuse, nonobstructive coronary disease, hypertension, ventricular fibrillation arrest in the setting of cocaine use in 2016 and hyperlipidemia who is being followed for NSTEMI.  Assessment & Plan    1. NSTEMI, nonobstructive CAD by cath 2016 - troponin 0.03 --> 1.96 --> 16.14 --> 12.27 - EKG with ST depressions - ASA, IV nitro, IV heparin - beta blocker held for cocaine use - plan for heart cath today - echo pending   2. Hx of cocaine abuse and Vfib arrest 2016 - UDS positive for cocaine   3. HTN - continue norvasc and losartan   4. HLD - simvastatin changed to lipitor 80 mg      For questions or updates, please contact [facility] Please consult [email or web address] for contact info under Cardiology/STEMI.      Signed, Roe Rutherford Duke, PA  12/27/2017, 7:16 AM    I have examined the patient and reviewed assessment and plan and discussed with patient.  Agree with above as stated.  Patient seen post cath.  Right radial site stable.  Cath results reviewed.  I stressed the importance of avoiding cocaine.  Likely discharge tomorrow.   Lance Muss

## 2017-12-27 NOTE — Plan of Care (Signed)
  Problem: Clinical Measurements: Goal: Will remain free from infection Outcome: Progressing Note:  No s/s of infection noted. Goal: Respiratory complications will improve Outcome: Progressing Note:  No s/s of respiratory complications. Goal: Cardiovascular complication will be avoided Outcome: Progressing Note:  No s/s of cardiovascular complication.   Problem: Coping: Goal: Level of anxiety will decrease Outcome: Completed/Met Note:  No s/s of anxiety noted.

## 2017-12-27 NOTE — Progress Notes (Signed)
ANTICOAGULATION CONSULT NOTE - Follow Up Consult  Pharmacy Consult for heparin Indication: NSTEMI  Labs: Recent Labs    12/25/17 0839 12/25/17 1412 12/25/17 2021  12/26/17 0342 12/26/17 1145 12/26/17 1815 12/27/17 0148  HGB 17.5* 16.1  --   --  14.3  --   --  15.3  HCT 53.5* 48.3  --   --  43.8  --   --  45.6  PLT 353 299  --   --  296  --   --  287  HEPARINUNFRC  --   --   --    < > 0.26* 0.22* 0.28* 0.36  CREATININE 1.20 1.12  --   --   --   --  1.19  --   TROPONINI <0.03 1.96* 16.14*  --  12.27*  --   --   --    < > = values in this interval not displayed.    Assessment/Plan:  57yo male therapeutic on heparin after rate changes. Will continue gtt at current rate and confirm stable with additional level.   Vernard Gambles, PharmD, BCPS  12/27/2017,2:38 AM

## 2017-12-27 NOTE — Progress Notes (Signed)
CARDIAC REHAB PHASE I   PRE:  Rate/Rhythm: 72 SR  BP:  Sitting: 127/80      MODE:  Ambulation: 470 ft   POST:  Rate/Rhythm: 82 SR    BP:  Sitting: 124/81   Pt ambulated 456ft in hallway independently with steady gait. No c/o CP or SOB. Pt educated on importance of ASA, Plavix, statin, and NTG. Pt given MI booklet along with heart healthy diet. Heavily encouraged risk factor modification with pt. Pt states that he feels like he can stop using cocaine. Reviewed restrictions and exercise guidelines. Will refer to CRP II [place]. Will continue to monitor.  [id number] Reynold Bowen, RN BSN 12/27/2017 1:49 PM

## 2017-12-27 NOTE — Progress Notes (Signed)
  Echocardiogram 2D Echocardiogram has been performed.  Jashae Wiggs G Shenekia Riess 12/27/2017, 4:06 PM

## 2017-12-28 ENCOUNTER — Telehealth: Payer: Self-pay | Admitting: Physician Assistant

## 2017-12-28 DIAGNOSIS — I259 Chronic ischemic heart disease, unspecified: Secondary | ICD-10-CM

## 2017-12-28 LAB — CBC
HCT: 48.2 % (ref 39.0–52.0)
Hemoglobin: 15.5 g/dL (ref 13.0–17.0)
MCH: 29.9 pg (ref 26.0–34.0)
MCHC: 32.2 g/dL (ref 30.0–36.0)
MCV: 93.1 fL (ref 78.0–100.0)
PLATELETS: 307 10*3/uL (ref 150–400)
RBC: 5.18 MIL/uL (ref 4.22–5.81)
RDW: 13.2 % (ref 11.5–15.5)
WBC: 7.7 10*3/uL (ref 4.0–10.5)

## 2017-12-28 LAB — BASIC METABOLIC PANEL
Anion gap: 6 (ref 5–15)
BUN: 11 mg/dL (ref 6–20)
CALCIUM: 8.6 mg/dL — AB (ref 8.9–10.3)
CHLORIDE: 106 mmol/L (ref 98–111)
CO2: 28 mmol/L (ref 22–32)
CREATININE: 1.16 mg/dL (ref 0.61–1.24)
Glucose, Bld: 145 mg/dL — ABNORMAL HIGH (ref 70–99)
Potassium: 4.1 mmol/L (ref 3.5–5.1)
SODIUM: 140 mmol/L (ref 135–145)

## 2017-12-28 LAB — HEPARIN LEVEL (UNFRACTIONATED)

## 2017-12-28 MED ORDER — CLOPIDOGREL BISULFATE 75 MG PO TABS: 75.0000 mg | ORAL_TABLET | Freq: Every day | ORAL | 0 refills | Status: DC

## 2017-12-28 MED ORDER — ATORVASTATIN CALCIUM 80 MG PO TABS: 80.0000 mg | ORAL_TABLET | Freq: Every day | ORAL | 0 refills | Status: DC

## 2017-12-28 NOTE — Clinical Social Work Note (Signed)
Clinical Social Work Assessment  Patient Details  Name: Bryce Ortega MRN: [id number] Date of Birth: [birth year]  Date of referral:  12/28/17               Reason for consult:  Substance Use/ETOH Abuse                Permission sought to share information with:    Permission granted to share information::     Name::        Agency::     Relationship::     Contact Information:     Housing/Transportation Living arrangements for the past 2 months:  Apartment Source of Information:  Patient Patient Interpreter Needed:  None Criminal Activity/Legal Involvement Pertinent to Current Situation/Hospitalization:  No - Comment as needed Significant Relationships:  Siblings, Friend Lives with:  Roommate Do you feel safe going back to the place where you live?  Yes Need for family participation in patient care:  No (Coment)  Care giving concerns: Patient from home with roommates. Patient positive for cocaine on admission.    Social Worker assessment / plan: CSW consulted for substance use. CSW met with patient at bedside. Patient alert and oriented, sitting up eating lunch. CSW introduced self and role and assessed substance use, reflecting that patient positive for cocaine. Patient endorsed cocaine use, reporting he uses 1-2x/month. Patient reported his main trigger for use is his friends that also use. CSW and patient discussed effects of use on patient's health. Patient aware of negative effect on health and indicated he wants to stop using; patient plans to avoid friends that use and spend more time with his roommates who don't use. Patient has been in treatment at [facility] in the past. Patient agreeable to accept list of resources for inpatient and outpatient substance use treatment. CSW also referred patient to [facility] of the [place]. CSW signing off, as no additional needs identified at this time.  Employment status:  [employer] information:  Self Pay (Medicaid Pending) PT  Recommendations:  Not assessed at this time Information / Referral to community resources:  Residential Substance Abuse Treatment Options, Outpatient Substance Abuse Treatment Options, [facility] [place]  Patient/Family's Response to care: Patient appreciative of care.  Patient/Family's Understanding of and Emotional Response to Diagnosis, Current Treatment, and Prognosis: Patient with good understanding of condition and motivated to change substance use.  Emotional Assessment Appearance:  Appears stated age Attitude/Demeanor/Rapport:  Engaged Affect (typically observed):  Accepting, Calm, Appropriate, Pleasant Orientation:  Oriented to Self, Oriented to Place, Oriented to  Time, Oriented to Situation Alcohol / Substance use:  Illicit Drugs Psych involvement (Current and /or in the community):  No (Comment)  Discharge Needs  Concerns to be addressed:  Substance Abuse Concerns Readmission within the last 30 days:  No Current discharge risk:  Substance Abuse Barriers to Discharge:  No Barriers Identified   Estanislado Emms, LCSW 12/28/2017, 12:26 PM

## 2017-12-28 NOTE — Progress Notes (Addendum)
Progress Note  Patient Name: Bryce Ortega Date of Encounter: 12/28/2017  Primary Cardiologist: Lyn Records III, MD   Subjective   Pt denies chest pain, did well overnight. Ready for discharge.  Inpatient Medications    Scheduled Meds: . amLODipine  5 mg Oral Daily  . aspirin  81 mg Oral Daily  . atorvastatin  80 mg Oral q1800  . clopidogrel  75 mg Oral Q breakfast  . enoxaparin (LOVENOX) injection  40 mg Subcutaneous Q24H  . lidocaine  1 patch Transdermal Q24H  . losartan  25 mg Oral Daily  . sodium chloride flush  3 mL Intravenous Q12H   Continuous Infusions: . sodium chloride     PRN Meds: sodium chloride, acetaminophen, ALPRAZolam, gi cocktail, ketorolac, nitroGLYCERIN, ondansetron (ZOFRAN) IV, oxyCODONE, sodium chloride flush, zolpidem   Vital Signs    Vitals:   12/27/17 1605 12/27/17 2159 12/28/17 0509 12/28/17 0511  BP: 123/81 (!) 127/91 (!) 132/98   Pulse: 64 (!) 59 61   Resp:      Temp: 98.2 F (36.8 C) 98.2 F (36.8 C) (!) 97.5 F (36.4 C)   TempSrc: Oral Oral Oral   SpO2: 95% 98% 97%   Weight:    157 lb 4.8 oz (71.4 kg)  Height:        Intake/Output Summary (Last 24 hours) at 12/28/2017 0714 Last data filed at 12/28/2017 0711 Gross per 24 hour  Intake 480 ml  Output 2850 ml  Net -2370 ml   Filed Weights   12/26/17 0438 12/27/17 0500 12/28/17 0511  Weight: 159 lb 14.4 oz (72.5 kg) 157 lb 8 oz (71.4 kg) 157 lb 4.8 oz (71.4 kg)    Telemetry    Sinus rhythm - Personally Reviewed  ECG    Normal sinus rhythm - Personally Reviewed  Physical Exam   GEN: No acute distress.   Neck: No JVD Cardiac: RRR, no murmurs, rubs, or gallops.  Respiratory: respirations unlabored, occasional wheezes throughout GI: Soft, nontender, non-distended, right radial C/D/I, no hematoma MS: No edema; No deformity. Neuro:  Nonfocal  Psych: Normal affect   Labs    Chemistry Recent Labs  Lab 12/25/17 0839 12/25/17 1412 12/26/17 1815  NA 140  --  141    K 4.2  --  4.4  CL 107  --  110  CO2 23  --  24  GLUCOSE 106*  --  84  BUN 17  --  14  CREATININE 1.20 1.12 1.19  CALCIUM 8.3*  --  8.4*  GFRNONAA >60 >60 >60  GFRAA >60 >60 >60  ANIONGAP 10  --  7     Hematology Recent Labs  Lab 12/26/17 0342 12/27/17 0148 12/28/17 0558  WBC 8.7 8.2 7.7  RBC 4.74 4.99 5.18  HGB 14.3 15.3 15.5  HCT 43.8 45.6 48.2  MCV 92.4 91.4 93.1  MCH 30.2 30.7 29.9  MCHC 32.6 33.6 32.2  RDW 13.4 13.2 13.2  PLT 296 287 307    Cardiac Enzymes Recent Labs  Lab 12/25/17 0839 12/25/17 1412 12/25/17 2021 12/26/17 0342  TROPONINI <0.03 1.96* 16.14* 12.27*    Recent Labs  Lab 12/25/17 0847  TROPIPOC 0.00     BNP Recent Labs  Lab 12/25/17 0839  BNP 11.3     DDimer No results for input(s): DDIMER in the last 168 hours.   Radiology    No results found.  Cardiac Studies   Echo 12/27/17: Study Conclusions - Left ventricle: The cavity size  was normal. Wall thickness was   normal. Systolic function was normal. The estimated ejection   fraction was in the range of 60% to 65%. Wall motion was normal;   there were no regional wall motion abnormalities. Doppler   parameters are consistent with abnormal left ventricular   relaxation (grade 1 diastolic dysfunction). - Aortic valve: There was trivial regurgitation.  Impressions: - Normal LV function; mild diastolic dysfunction; trace AI.   Left heat cath 12/27/17:  Widely patent coronary arteries.  Coronary anatomy.  Left main is normal.  The LAD contains 30 to 40% mid eccentric narrowing after the second diagonal.  The LAD does not wraparound the apex.  The distal segment demonstrates some evidence of systolic compression.  The circumflex is widely patent.  The second and third obtuse marginals contained 40% ostial narrowing.  Dominant vessel with eccentric 30% mid vessel narrowing.  Non-ST elevation myocardial infarction affecting the anterolateral wall, possibly related to  coronary vasospasm in the setting of cocaine use versus atypical Takotsubo stress cardiomyopathy.  RECOMMENDATIONS:   Risk factor modification: Cocaine, blood pressure, glycemic, lipid, and tobacco (if any) control.    Frankly discussed the hazards of continued cocaine use given prior VF arrest and non-ST elevation MI on this occasion with wall motion abnormality.  Discontinue IV nitroglycerin.  Consider dual antiplatelet therapy for 6-12 months  Recommend uninterrupted dual antiplatelet therapy with Aspirin 81mg  daily and Clopidogrel 75mg  daily for a minimum of 6 months (stable ischemic heart disease - Class I recommendation).  Patient Profile     57 y.o. male with a hx of cocaine abuse, nonobstructive coronary disease, hypertension, ventricular fibrillation arrest in the setting of cocaine use in 2016 and hyperlipidemiawho is being followed for NSTEMI.  Assessment & Plan    1. NSTEMI - troponin 0.03 --> 1.96 --> 16.14 --> 12.27 - EKG with ST depressions - heart cath yesterday with nonobstructive CAD - DAPT for 6 months with ASA and plavix was recommended - beta blocker held for cocaine use - echo today with normal EF and grade 1 DD - Hb stable, no signs of bleeding   2. Cocaine abuse, hx of Vfib arrest 2016 in setting of cocaine use - UDA positive for cocaine - long discussion about cessation   3. HTN - continue norvasc and losartan - pressures have been controlled   4. HLD 07/07/2017: Cholesterol 134; HDL 52; LDL Cholesterol 71; Triglycerides 55; VLDL 11 Continue lipitor 80 mg - changed from simvastatin. Repeat lipids and LFTs in 6 weeks from discharge.   OK to discharge from cardiology standpoint once seen by attending.   [facility] will sign off.   Medication Recommendations:  As above  For questions or updates, please contact [facility] Please consult [email or web address] for contact info under Cardiology/STEMI.      Signed, Roe Rutherford Duke, PA    12/28/2017, 7:14 AM    I have examined the patient and reviewed assessment and plan and discussed with patient.  Agree with above as stated.  Right wrist stable.  I again stressed importance of avoiding cocaine.  OK to return to work on Monday 01/03/18.  Will sign off.  [facility] will sign off.   Medication Recommendations:  Aspirin Plavix as above Other recommendations (labs, testing, etc):  Avoid cocaine Follow up as an outpatient:  2-3 weeks  Lance Muss

## 2017-12-28 NOTE — Telephone Encounter (Signed)
**Note De-Identified Bryce Ortega Obfuscation** The pt is being discharged from the ED today. Will call tomorrow.

## 2017-12-28 NOTE — Care Management Note (Signed)
Case Management Note  Patient Details  Name: Bryce Ortega MRN: [id number] Date of Birth: 1960-10-01  Subjective/Objective:  Pt presented for Chest Pain. Pt has PCP: Lavinia Sharps NP @ the [facility] and [facility] Pt only needed bus passes to home.                Action/Plan: CM did provide patient with two bus passes. No further needs from CM at this time.   Expected Discharge Date:  12/28/17               Expected Discharge Plan:  Home/Self Care  In-House Referral:  NA  Discharge planning Services  CM Consult  Post Acute Care Choice:  NA Choice offered to:  NA  DME Arranged:  N/A DME Agency:  NA  HH Arranged:  NA HH Agency:  NA  Status of Service:  Completed, signed off  If discussed at Long Length of Stay Meetings, dates discussed:    Additional Comments:  Gala Lewandowsky, RN 12/28/2017, 3:13 PM

## 2017-12-28 NOTE — Discharge Instructions (Addendum)
Follow with Placey, [name], NP in 5-7 days  Avoid cocaine  Please get a complete blood count and chemistry panel checked by your Primary MD at your next visit, and again as instructed by your Primary MD. Please get your medications reviewed and adjusted by your Primary MD.  Please request your Primary MD to go over all Hospital Tests and Procedure/Radiological results at the follow up, please get all Hospital records sent to your Prim MD by signing hospital release before you go home.  If you had Pneumonia of Lung problems at the Hospital: Please get a 2 view Chest X ray done in 6-8 weeks after hospital discharge or sooner if instructed by your Primary MD.  If you have Congestive Heart Failure: Please call your Cardiologist or Primary MD anytime you have any of the following symptoms:  1) 3 pound weight gain in 24 hours or 5 pounds in 1 week  2) shortness of breath, with or without a dry hacking cough  3) swelling in the hands, feet or stomach  4) if you have to sleep on extra pillows at night in order to breathe  Follow cardiac low salt diet and 1.5 lit/day fluid restriction.  If you have diabetes Accuchecks 4 times/day, Once in AM empty stomach and then before each meal. Log in all results and show them to your primary doctor at your next visit. If any glucose reading is under 80 or above 300 call your primary MD immediately.  If you have Seizure/Convulsions/Epilepsy: Please do not drive, operate heavy machinery, participate in activities at heights or participate in high speed sports until you have seen by Primary MD or a Neurologist and advised to do so again.  If you had Gastrointestinal Bleeding: Please ask your Primary MD to check a complete blood count within one week of discharge or at your next visit. Your endoscopic/colonoscopic biopsies that are pending at the time of discharge, will also need to followed by your Primary MD.  Get Medicines reviewed and adjusted. Please  take all your medications with you for your next visit with your Primary MD  Please request your Primary MD to go over all hospital tests and procedure/radiological results at the follow up, please ask your Primary MD to get all Hospital records sent to his/her office.  If you experience worsening of your admission symptoms, develop shortness of breath, life threatening emergency, suicidal or homicidal thoughts you must seek medical attention immediately by calling 911 or calling your MD immediately  if symptoms less severe.  You must read complete instructions/literature along with all the possible adverse reactions/side effects for all the Medicines you take and that have been prescribed to you. Take any new Medicines after you have completely understood and accpet all the possible adverse reactions/side effects.   Do not drive or operate heavy machinery when taking Pain medications.   Do not take more than prescribed Pain, Sleep and Anxiety Medications  Special Instructions: If you have smoked or chewed Tobacco  in the last 2 yrs please stop smoking, stop any regular Alcohol  and or any Recreational drug use.  Wear Seat belts while driving.  Please note You were cared for by a hospitalist during your hospital stay. If you have any questions about your discharge medications or the care you received while you were in the hospital after you are discharged, you can call the unit and asked to speak with the hospitalist on call if the hospitalist that took care of you  is not available. Once you are discharged, your primary care physician will handle any further medical issues. Please note that NO REFILLS for any discharge medications will be authorized once you are discharged, as it is imperative that you return to your primary care physician (or establish a relationship with a primary care physician if you do not have one) for your aftercare needs so that they can reassess your need for medications  and monitor your lab values.  You can reach the hospitalist office at phone [phone] or fax [phone]   If you do not have a primary care physician, you can call [phone] for a physician referral.  Activity: As tolerated with Full fall precautions use walker/cane & assistance as needed  Diet: heart healthy.  Disposition Home

## 2017-12-28 NOTE — Telephone Encounter (Signed)
TOC Patient- Please call Patient- Pt has an appointment with Vin on 01-20-18.

## 2017-12-28 NOTE — Discharge Summary (Signed)
Physician Discharge Summary  Bryce Ortega VHQ:[id number] DOB: Mar 30, 1961 DOA: 12/25/2017  PCP: Bryce Sharps, NP  Admit date: 12/25/2017 Discharge date: 12/28/2017  Admitted From: home Disposition:  Home   Recommendations for Outpatient Follow-up:  1. Follow up with PCP in 1-2 weeks  Home Health: none Equipment/Devices: none  Discharge Condition: stable CODE STATUS: Full code Diet recommendation: heart healthy  HPI: Per Dr. Lynn Ito is a 57 y.o. male with medical history significant of chest pain with myocardial infarction in January 2019 status post cardiac catheterization which was unremarkable hypercholesterolemia, hypertension, cardiac arrest, multiple admissions for cocaine related chest pain who presents to the ER complaining of chest pain with pain radiating into his left arm and associated shortness of breath that began this morning.  Nothing makes the pain better nothing makes the pain worse.  The ER patient received nitroglycerin sublingually x2 as well as an infusion of nitroglycerin has now been discontinued, Ativan and an aspirin. ED Course: EKG shows sinus rhythm and is unremarkable.  Chest x-ray is negative.  Cardiac enzymes are negative.  Dr. Erma Ortega spoke with the cardiologist on-call who does not feel the patient needs to undergo any stress testing or repeat cardiac catheterization at this point.  Cardiology did recommend observation to rule out myocardial infarction.  Hospital Course: NSTEMI -admitted to the hospital with chest pain and troponin elevation, his cardiac enzymes peaked up to 16 and started to decline.  He was initially placed on heparin as well as nitroglycerin.  Cardiology was consulted and he underwent a cardiac catheterization on 7/15   Widely patent coronary arteries. Coronary anatomy.  Left main is normal.  The LAD contains 30 to 40% mid eccentric narrowing after the second diagonal. The LAD does not wraparound the apex.  The distal segment demonstrates some evidence of systolic compression.  The circumflex is widely patent. The second and third obtuse marginals contained 40% ostial narrowing.  Dominant vessel with eccentric 30% mid vessel narrowing.  Non-ST elevation myocardial infarction affecting the anterolateral wall, possibly related to coronary vasospasm in the setting of cocaine use versus atypical Takotsubo stress cardiomyopathy.  RECOMMENDATIONS:   Risk factor modification: Cocaine, blood pressure, glycemic, lipid, and tobacco (if any) control.   Frankly discussed the hazards of continued cocaine use given prior VF arrest and non-ST elevation MI on this occasion with wall motion abnormality.  Discontinue IV nitroglycerin.  Consider dual antiplatelet therapy for 6-12 months  Recommendations were per cardiology for dual antiplatelet therapy for 6 months with aspirin and Plavix. No BB use due to cocaine  Cocaine abuse -Counseled for cessation, Child psychotherapist consulted.  Seems motivated to quit Hypertension -BP controlled, continue current regimen Hyperlipidemia -On statin    Discharge Diagnoses:  Principal Problem:   Chest pain Active Problems:   Essential hypertension   Hyperlipidemia   Cocaine abuse [facility])     Discharge Instructions  Discharge Instructions    AMB Referral to Cardiac Rehabilitation - Phase II   Complete by:  As directed    Diagnosis:  NSTEMI   Amb Referral to Cardiac Rehabilitation   Complete by:  As directed    Diagnosis:  NSTEMI     Allergies as of 12/28/2017      Reactions   Propoxyphene N-acetaminophen Nausea Only   REACTION: GI upset      Medication List    STOP taking these medications   simvastatin 20 MG tablet Commonly known as:  ZOCOR     TAKE  these medications   amLODipine 5 MG tablet Commonly known as:  NORVASC Take 1 tablet (5 mg total) by mouth daily.   aspirin EC 81 MG tablet Take 1 tablet (81 mg total) by mouth daily.     atorvastatin 80 MG tablet Commonly known as:  LIPITOR Take 1 tablet (80 mg total) by mouth daily at 6 PM.   clopidogrel 75 MG tablet Commonly known as:  PLAVIX Take 1 tablet (75 mg total) by mouth daily with breakfast. Start taking on:  12/29/2017   isosorbide mononitrate 30 MG 24 hr tablet Commonly known as:  IMDUR Take 1 tablet (30 mg total) by mouth daily.   lidocaine 5 % Commonly known as:  LIDODERM Place 1 patch onto the skin daily. Remove & Discard patch within 12 hours or as directed by MD   losartan 25 MG tablet Commonly known as:  COZAAR Take 1 tablet (25 mg total) by mouth daily.   nitroGLYCERIN 0.4 MG SL tablet Commonly known as:  NITROSTAT Place 1 tablet (0.4 mg total) under the tongue every 5 (five) minutes as needed for chest pain (for 3 doses).      Follow-up Information    [facility], Sharrell Ku, [place] Follow up on 01/20/2018.   Specialty:  Cardiology Why:  9:00 am for TCM hospital follow up Contact information: [address] [place] [address] [phone]        [facility] Follow up.   Specialty:  Pension scheme manager information: [facility] [employer] [address] [place] [address] [phone]           Consultations:  Cardiology  Procedures/Studies: 2D echo   Study Conclusions - Left ventricle: The cavity size was normal. Wall thickness was normal. Systolic function was normal. The estimated ejection fraction was in the range of 60% to 65%. Wall motion was normal; there were no regional wall motion abnormalities. Doppler parameters are consistent with abnormal left ventricular relaxation (grade 1 diastolic dysfunction). - Aortic valve: There was trivial regurgitation.  Impressions: - Normal LV function; mild diastolic dysfunction; trace AI.   Dg Chest 2 View  Result Date: 12/25/2017 CLINICAL DATA:  Central chest pain.  Shortness of breath. EXAM: CHEST - 2 VIEW  COMPARISON:  December 20, 2017 FINDINGS: The heart size and mediastinal contours are within normal limits. Both lungs are clear. The visualized skeletal structures are unremarkable. IMPRESSION: No active cardiopulmonary disease. Electronically Signed   By: Gerome Sam III M.D   On: 12/25/2017 08:58   Dg Chest 2 View  Result Date: 12/20/2017 CLINICAL DATA:  Mid chest pain for 1 hour. EXAM: CHEST - 2 VIEW COMPARISON:  05/17/2017 FINDINGS: The cardiomediastinal contours are normal. The lungs are clear. Pulmonary vasculature is normal. No consolidation, pleural effusion, or pneumothorax. No acute osseous abnormalities are seen. IMPRESSION: No acute pulmonary process. Electronically Signed   By: Rubye Oaks M.D.   On: 12/20/2017 02:53      Subjective: - no chest pain, shortness of breath, no abdominal pain, nausea or vomiting.   Discharge Exam: Vitals:   12/27/17 2159 12/28/17 0509  BP: (!) 127/91 (!) 132/98  Pulse: (!) 59 61  Resp:    Temp: 98.2 F (36.8 C) (!) 97.5 F (36.4 C)  SpO2: 98% 97%    General: Pt is alert, awake, not in acute distress Cardiovascular: RRR, S1/S2 +, no rubs, no gallops Respiratory: CTA bilaterally, no wheezing, no rhonchi Abdominal: Soft, NT, ND,  bowel sounds + Extremities: no edema, no cyanosis    The results of significant diagnostics from this hospitalization (including imaging, microbiology, ancillary and laboratory) are listed below for reference.     Microbiology: No results found for this or any previous visit (from the past 240 hour(s)).   Labs: BNP (last 3 results) Recent Labs    12/25/17 0839  BNP 11.3   Basic Metabolic Panel: Recent Labs  Lab 12/25/17 0839 12/25/17 1412 12/26/17 1815 12/28/17 0558  NA 140  --  141 140  K 4.2  --  4.4 4.1  CL 107  --  110 106  CO2 23  --  24 28  GLUCOSE 106*  --  84 145*  BUN 17  --  14 11  CREATININE 1.20 1.12 1.19 1.16  CALCIUM 8.3*  --  8.4* 8.6*  MG 2.2  --   --   --    Liver  Function Tests: No results for input(s): AST, ALT, ALKPHOS, BILITOT, PROT, ALBUMIN in the last 168 hours. No results for input(s): LIPASE, AMYLASE in the last 168 hours. No results for input(s): AMMONIA in the last 168 hours. CBC: Recent Labs  Lab 12/25/17 0839 12/25/17 1412 12/26/17 0342 12/27/17 0148 12/28/17 0558  WBC 9.7 10.3 8.7 8.2 7.7  HGB 17.5* 16.1 14.3 15.3 15.5  HCT 53.5* 48.3 43.8 45.6 48.2  MCV 91.3 90.4 92.4 91.4 93.1  PLT 353 299 296 287 307   Cardiac Enzymes: Recent Labs  Lab 12/25/17 0839 12/25/17 1412 12/25/17 2021 12/26/17 0342  TROPONINI <0.03 1.96* 16.14* 12.27*   BNP: Invalid input(s): POCBNP CBG: No results for input(s): GLUCAP in the last 168 hours. D-Dimer No results for input(s): DDIMER in the last 72 hours. Hgb A1c No results for input(s): HGBA1C in the last 72 hours. Lipid Profile No results for input(s): CHOL, HDL, LDLCALC, TRIG, CHOLHDL, LDLDIRECT in the last 72 hours. Thyroid function studies No results for input(s): TSH, T4TOTAL, T3FREE, THYROIDAB in the last 72 hours.  Invalid input(s): FREET3 Anemia work up No results for input(s): VITAMINB12, FOLATE, FERRITIN, TIBC, IRON, RETICCTPCT in the last 72 hours. Urinalysis No results found for: COLORURINE, APPEARANCEUR, LABSPEC, PHURINE, GLUCOSEU, HGBUR, BILIRUBINUR, KETONESUR, PROTEINUR, UROBILINOGEN, NITRITE, LEUKOCYTESUR Sepsis Labs Invalid input(s): PROCALCITONIN,  WBC,  LACTICIDVEN   Time coordinating discharge: 20 minutes  SIGNED:  Pamella Pert, MD  [facility] 12/28/2017, 1:54 PM Pager [phone]  If 7PM-7AM, please contact night-coverage [email or web address] Password [email or web address]

## 2017-12-29 ENCOUNTER — Telehealth (HOSPITAL_COMMUNITY): Payer: Self-pay

## 2017-12-29 ENCOUNTER — Encounter (HOSPITAL_COMMUNITY): Payer: Self-pay

## 2017-12-29 NOTE — Telephone Encounter (Signed)
Attempted to call patient in regards to Cardiac Rehab - Not able to leave VM, mailed letter.

## 2017-12-29 NOTE — Telephone Encounter (Signed)
**Note De-identified Alaynah Schutter Obfuscation** No answer and no way to leave a message as the pts VM is full. Will continue to call. 

## 2017-12-30 NOTE — Telephone Encounter (Signed)
Left generic VM d/t NO DPR on file  Patient contacted regarding discharge from [facility] on 12/28/2017  Patient understands to follow up with Vin Bhagat on 01/20/2018 at 9:00 am  Left office number and advised Pt to call if any cardiac issues.

## 2018-01-20 ENCOUNTER — Ambulatory Visit: Payer: Self-pay | Admitting: Physician Assistant

## 2018-01-20 NOTE — Progress Notes (Deleted)
Cardiology Office Note    Date:  01/20/2018   ID:  Bryce Ortega, DOB 1961-02-02, MRN [id number]  PCP:  Lavinia Sharps, NP  Cardiologist:  Dr. Katrinka Blazing   Chief Complaint: Hospital follow up   History of Present Illness:   Bryce Ortega is a 57 y.o. male with hx of HTN, ventricular fibrillation arrest in the setting of cocaine use in 2016 and hyperlipidemiawho presented for hospital follow up.   Admitted 12/2017 with NSTEMI. Troponin peaked at 16.14. Cath showed non obstructive CAD. Felt likely coronary vasospasm in the setting of cocaine use versus atypical Takotsubo stress cardiomyopathy. No BB given cocaine abuse. DAPT for 6 months with ASA and plavix was recommended. Echo with normal EF and grade 1 DD.    Past Medical History:  Diagnosis Date  . Cocaine abuse (HCC)   . H/O ventricular fibrillation 2016   s/p cocaine use  . High cholesterol   . Hypertension   . MI (mitral incompetence)   . Myocardial infarction [facility]) 2016   "related to cocaine"  . Nonobstructive atherosclerosis of coronary artery 2016   LHC showed normal EF and mild non-obstructive CAD    Past Surgical History:  Procedure Laterality Date  . CARDIAC CATHETERIZATION N/A 05/10/2015   Procedure: Left Heart Cath and Coronary Angiography;  Surgeon: Dolores Patty, MD;  Location: [facility] INVASIVE CV LAB;  Service: Cardiovascular;  Laterality: N/A;  . LEFT HEART CATH AND CORONARY ANGIOGRAPHY N/A 12/27/2017   Procedure: LEFT HEART CATH AND CORONARY ANGIOGRAPHY;  Surgeon: Lyn Records, MD;  Location: [facility] INVASIVE CV LAB;  Service: Cardiovascular;  Laterality: N/A;  . TONSILLECTOMY      Current Medications: Prior to Admission medications   Medication Sig Start Date End Date Taking? Authorizing Provider  amLODipine (NORVASC) 5 MG tablet Take 1 tablet (5 mg total) by mouth daily. 07/07/17   Rama, Maryruth Bun, MD  aspirin EC 81 MG tablet Take 1 tablet (81 mg total) by mouth daily. 05/11/15   Ghimire, Werner Lean,  MD  atorvastatin (LIPITOR) 80 MG tablet Take 1 tablet (80 mg total) by mouth daily at 6 PM. 12/28/17   Leatha Gilding, MD  clopidogrel (PLAVIX) 75 MG tablet Take 1 tablet (75 mg total) by mouth daily with breakfast. 12/29/17   Leatha Gilding, MD  isosorbide mononitrate (IMDUR) 30 MG 24 hr tablet Take 1 tablet (30 mg total) by mouth daily. 07/07/17   Rama, Maryruth Bun, MD  lidocaine (LIDODERM) 5 % Place 1 patch onto the skin daily. Remove & Discard patch within 12 hours or as directed by MD 09/30/17   Petrucelli, Lelon Mast R, PA-C  losartan (COZAAR) 25 MG tablet Take 1 tablet (25 mg total) by mouth daily. 07/07/17   Rama, Maryruth Bun, MD  nitroGLYCERIN (NITROSTAT) 0.4 MG SL tablet Place 1 tablet (0.4 mg total) under the tongue every 5 (five) minutes as needed for chest pain (for 3 doses). 07/07/17   Rama, Maryruth Bun, MD    Allergies:   Propoxyphene n-acetaminophen   Social History   Socioeconomic History  . Marital status: Divorced    Spouse name: Not on file  . Number of children: Not on file  . Years of education: Not on file  . Highest education level: Not on file  Occupational History  . Not on file  Social Needs  . Financial resource strain: Not on file  . Food insecurity:    Worry: Not on file    Inability:  Not on file  . Transportation needs:    Medical: Not on file    Non-medical: Not on file  Tobacco Use  . Smoking status: Never Smoker  . Smokeless tobacco: Never Used  Substance and Sexual Activity  . Alcohol use: Yes    Alcohol/week: 13.0 standard drinks    Types: 13 Cans of beer per week    Comment: 07/06/2017 "~ 2 40 oz beers 2x/week"  . Drug use: Yes    Types: Cocaine    Comment: 07/06/2017 "weekly"  . Sexual activity: Yes  Lifestyle  . Physical activity:    Days per week: Not on file    Minutes per session: Not on file  . Stress: Not on file  Relationships  . Social connections:    Talks on phone: Not on file    Gets together: Not on file    Attends  religious service: Not on file    Active member of club or organization: Not on file    Attends meetings of clubs or organizations: Not on file    Relationship status: Not on file  Other Topics Concern  . Not on file  Social History Narrative   ** Merged History Encounter **         Family History:  The patient's family history includes CAD in his father; Heart disease in his maternal grandfather and mother; Hypertension in his father. ***  ROS:   Please see the history of present illness.    ROS All other systems reviewed and are negative.   PHYSICAL EXAM:   VS:  There were no vitals taken for this visit.   GEN: Well nourished, well developed, in no acute distress  HEENT: normal  Neck: no JVD, carotid bruits, or masses Cardiac: ***RRR; no murmurs, rubs, or gallops,no edema  Respiratory:  clear to auscultation bilaterally, normal work of breathing GI: soft, nontender, nondistended, + BS MS: no deformity or atrophy  Skin: warm and dry, no rash Neuro:  Alert and Oriented x 3, Strength and sensation are intact Psych: euthymic mood, full affect  Wt Readings from Last 3 Encounters:  12/28/17 157 lb 4.8 oz (71.4 kg)  12/20/17 162 lb (73.5 kg)  09/30/17 162 lb (73.5 kg)      Studies/Labs Reviewed:   EKG:  EKG is ordered today.  The ekg ordered today demonstrates ***  Recent Labs: 07/06/2017: ALT 20 12/25/2017: B Natriuretic Peptide 11.3; Magnesium 2.2 12/28/2017: BUN 11; Creatinine, Ser 1.16; Hemoglobin 15.5; Platelets 307; Potassium 4.1; Sodium 140   Lipid Panel    Component Value Date/Time   CHOL 134 07/07/2017 0743   TRIG 55 07/07/2017 0743   HDL 52 07/07/2017 0743   CHOLHDL 2.6 07/07/2017 0743   VLDL 11 07/07/2017 0743   LDLCALC 71 07/07/2017 0743    Additional studies/ records that were reviewed today include:   Echo 12/27/17: Study Conclusions - Left ventricle: The cavity size was normal. Wall thickness was normal. Systolic function was normal. The  estimated ejection fraction was in the range of 60% to 65%. Wall motion was normal; there were no regional wall motion abnormalities. Doppler parameters are consistent with abnormal left ventricular relaxation (grade 1 diastolic dysfunction). - Aortic valve: There was trivial regurgitation.  Impressions: - Normal LV function; mild diastolic dysfunction; trace AI.   Left heat cath 12/27/17:  Widely patent coronary arteries. Coronary anatomy.  Left main is normal.  The LAD contains 30 to 40% mid eccentric narrowing after the second diagonal. The  LAD does not wraparound the apex. The distal segment demonstrates some evidence of systolic compression.  The circumflex is widely patent. The second and third obtuse marginals contained 40% ostial narrowing.  Dominant vessel with eccentric 30% mid vessel narrowing.  Non-ST elevation myocardial infarction affecting the anterolateral wall, possibly related to coronary vasospasm in the setting of cocaine use versus atypical Takotsubo stress cardiomyopathy.  RECOMMENDATIONS:   Risk factor modification: Cocaine, blood pressure, glycemic, lipid, and tobacco (if any) control.   Frankly discussed the hazards of continued cocaine use given prior VF arrest and non-ST elevation MI on this occasion with wall motion abnormality.  Discontinue IV nitroglycerin.  Consider dual antiplatelet therapy for 6-12 months  Recommend uninterrupted dual antiplatelet therapy with Aspirin 81mg  daily and Clopidogrel 75mg  dailyfor a minimum of 6 months (stable ischemic heart disease - Class I recommendation).   ASSESSMENT & PLAN:    1. NSTEMI in setting of cocaine abuse - Cath with non obstructive CAD    Medication Adjustments/Labs and Tests Ordered: Current medicines are reviewed at length with the patient today.  Concerns regarding medicines are outlined above.  Medication changes, Labs and Tests ordered today are listed in the Patient  Instructions below. There are no Patient Instructions on file for this visit.   Lorelei Pont, [place]  01/20/2018 7:48 AM    [facility] [address] [place], [place], [place]  [address] Phone: [phone]; Fax: [phone]

## 2018-01-26 ENCOUNTER — Encounter: Payer: Self-pay | Admitting: Physician Assistant

## 2018-03-18 ENCOUNTER — Encounter (HOSPITAL_COMMUNITY): Payer: Self-pay | Admitting: Emergency Medicine

## 2018-03-18 ENCOUNTER — Inpatient Hospital Stay (HOSPITAL_COMMUNITY)
Admission: EM | Admit: 2018-03-18 | Discharge: 2018-03-20 | DRG: 281 | Disposition: A | Discharge status: Home / Self Care | Payer: Self-pay | Attending: Internal Medicine | Admitting: Internal Medicine

## 2018-03-18 ENCOUNTER — Other Ambulatory Visit: Payer: Self-pay

## 2018-03-18 ENCOUNTER — Emergency Department (HOSPITAL_COMMUNITY): Payer: Self-pay

## 2018-03-18 DIAGNOSIS — R7989 Other specified abnormal findings of blood chemistry: Secondary | ICD-10-CM

## 2018-03-18 DIAGNOSIS — I214 Non-ST elevation (NSTEMI) myocardial infarction: Principal | ICD-10-CM | POA: Diagnosis present

## 2018-03-18 DIAGNOSIS — N179 Acute kidney failure, unspecified: Secondary | ICD-10-CM | POA: Diagnosis present

## 2018-03-18 DIAGNOSIS — E7800 Pure hypercholesterolemia, unspecified: Secondary | ICD-10-CM | POA: Diagnosis present

## 2018-03-18 DIAGNOSIS — Z7902 Long term (current) use of antithrombotics/antiplatelets: Secondary | ICD-10-CM

## 2018-03-18 DIAGNOSIS — R072 Precordial pain: Secondary | ICD-10-CM | POA: Diagnosis present

## 2018-03-18 DIAGNOSIS — Z9119 Patient's noncompliance with other medical treatment and regimen: Secondary | ICD-10-CM

## 2018-03-18 DIAGNOSIS — Z79899 Other long term (current) drug therapy: Secondary | ICD-10-CM

## 2018-03-18 DIAGNOSIS — I25111 Atherosclerotic heart disease of native coronary artery with angina pectoris with documented spasm: Secondary | ICD-10-CM

## 2018-03-18 DIAGNOSIS — F1410 Cocaine abuse, uncomplicated: Secondary | ICD-10-CM | POA: Diagnosis present

## 2018-03-18 DIAGNOSIS — I252 Old myocardial infarction: Secondary | ICD-10-CM

## 2018-03-18 DIAGNOSIS — R079 Chest pain, unspecified: Secondary | ICD-10-CM

## 2018-03-18 DIAGNOSIS — Z7982 Long term (current) use of aspirin: Secondary | ICD-10-CM

## 2018-03-18 DIAGNOSIS — R739 Hyperglycemia, unspecified: Secondary | ICD-10-CM | POA: Diagnosis present

## 2018-03-18 DIAGNOSIS — Z888 Allergy status to other drugs, medicaments and biological substances status: Secondary | ICD-10-CM

## 2018-03-18 DIAGNOSIS — R778 Other specified abnormalities of plasma proteins: Secondary | ICD-10-CM

## 2018-03-18 DIAGNOSIS — Z8249 Family history of ischemic heart disease and other diseases of the circulatory system: Secondary | ICD-10-CM

## 2018-03-18 DIAGNOSIS — E785 Hyperlipidemia, unspecified: Secondary | ICD-10-CM | POA: Diagnosis present

## 2018-03-18 DIAGNOSIS — I10 Essential (primary) hypertension: Secondary | ICD-10-CM | POA: Diagnosis present

## 2018-03-18 HISTORY — DX: Adverse effect of unspecified anesthetic, initial encounter: T41.45XA

## 2018-03-18 HISTORY — DX: Other complications of anesthesia, initial encounter: T88.59XA

## 2018-03-18 LAB — BASIC METABOLIC PANEL WITH GFR
Anion gap: 6 (ref 5–15)
BUN: 10 mg/dL (ref 6–20)
CO2: 24 mmol/L (ref 22–32)
Calcium: 8.4 mg/dL — ABNORMAL LOW (ref 8.9–10.3)
Chloride: 108 mmol/L (ref 98–111)
Creatinine, Ser: 1.41 mg/dL — ABNORMAL HIGH (ref 0.61–1.24)
GFR calc Af Amer: >60 mL/min
GFR calc non Af Amer: 54 mL/min — ABNORMAL LOW
Glucose, Bld: 154 mg/dL — ABNORMAL HIGH (ref 70–99)
Potassium: 4.5 mmol/L (ref 3.5–5.1)
Sodium: 138 mmol/L (ref 135–145)

## 2018-03-18 LAB — CBC
HCT: 49.6 % (ref 39.0–52.0)
Hemoglobin: 16.3 g/dL (ref 13.0–17.0)
MCH: 31.4 pg (ref 26.0–34.0)
MCHC: 32.9 g/dL (ref 30.0–36.0)
MCV: 95.6 fL (ref 78.0–100.0)
Platelets: 276 10*3/uL (ref 150–400)
RBC: 5.19 MIL/uL (ref 4.22–5.81)
RDW: 13.4 % (ref 11.5–15.5)
WBC: 6.8 10*3/uL (ref 4.0–10.5)

## 2018-03-18 LAB — MRSA PCR SCREENING: MRSA by PCR: NEGATIVE

## 2018-03-18 LAB — PHOSPHORUS: Phosphorus: 2.8 mg/dL (ref 2.5–4.6)

## 2018-03-18 LAB — MAGNESIUM: Magnesium: 2.7 mg/dL — ABNORMAL HIGH (ref 1.7–2.4)

## 2018-03-18 LAB — I-STAT TROPONIN, ED
TROPONIN I, POC: 0.02 ng/mL (ref 0.00–0.08)
Troponin i, poc: 0.2 ng/mL (ref 0.00–0.08)

## 2018-03-18 LAB — RAPID URINE DRUG SCREEN, HOSP PERFORMED
AMPHETAMINES: NOT DETECTED
BARBITURATES: NOT DETECTED
BENZODIAZEPINES: NOT DETECTED
Cocaine: POSITIVE — AB
Opiates: NOT DETECTED
Tetrahydrocannabinol: NOT DETECTED

## 2018-03-18 LAB — HEPATIC FUNCTION PANEL
ALT: 24 U/L (ref 0–44)
AST: 28 U/L (ref 15–41)
Albumin: 3.1 g/dL — ABNORMAL LOW (ref 3.5–5.0)
Alkaline Phosphatase: 55 U/L (ref 38–126)
Bilirubin, Direct: 0.3 mg/dL — ABNORMAL HIGH (ref 0.0–0.2)
Indirect Bilirubin: 0.5 mg/dL (ref 0.3–0.9)
Total Bilirubin: 0.8 mg/dL (ref 0.3–1.2)
Total Protein: 5.3 g/dL — ABNORMAL LOW (ref 6.5–8.1)

## 2018-03-18 LAB — LIPASE, BLOOD: Lipase: 30 U/L (ref 11–51)

## 2018-03-18 LAB — TROPONIN I: Troponin I: 0.35 ng/mL

## 2018-03-18 MED ORDER — LOSARTAN POTASSIUM 25 MG PO TABS
25.0000 mg | ORAL_TABLET | Freq: Every day | ORAL | Status: DC
  Administered 2018-03-18 – 2018-03-20 (×3): 25 mg via ORAL
  Filled 2018-03-18 (×3): qty 1

## 2018-03-18 MED ORDER — ENOXAPARIN SODIUM 40 MG/0.4ML ~~LOC~~ SOLN: 40.0000 mg | SUBCUTANEOUS | Status: DC

## 2018-03-18 MED ORDER — NITROGLYCERIN 0.4 MG SL SUBL
0.4000 mg | SUBLINGUAL_TABLET | SUBLINGUAL | Status: AC | PRN
  Administered 2018-03-18: 0.4 mg via SUBLINGUAL
  Filled 2018-03-18: qty 1

## 2018-03-18 MED ORDER — HEPARIN (PORCINE) IN NACL 100-0.45 UNIT/ML-% IJ SOLN
950.0000 [IU]/h | INTRAMUSCULAR | Status: DC
  Administered 2018-03-18: 900 [IU]/h via INTRAVENOUS
  Administered 2018-03-19: 950 [IU]/h via INTRAVENOUS
  Filled 2018-03-18 (×2): qty 250

## 2018-03-18 MED ORDER — ISOSORBIDE DINITRATE 5 MG PO TABS: 5.0000 mg | ORAL_TABLET | Freq: Two times a day (BID) | ORAL | Status: DC

## 2018-03-18 MED ORDER — ISOSORBIDE MONONITRATE ER 30 MG PO TB24
30.0000 mg | ORAL_TABLET | Freq: Every day | ORAL | Status: DC
  Administered 2018-03-18 – 2018-03-20 (×3): 30 mg via ORAL
  Filled 2018-03-18 (×3): qty 1

## 2018-03-18 MED ORDER — LORAZEPAM 2 MG/ML IJ SOLN
1.0000 mg | Freq: Once | INTRAMUSCULAR | Status: AC
  Administered 2018-03-18: 1 mg via INTRAVENOUS
  Filled 2018-03-18: qty 1

## 2018-03-18 MED ORDER — FENTANYL CITRATE (PF) 100 MCG/2ML IJ SOLN: 50.0000 ug | Freq: Once | INTRAMUSCULAR | Status: DC

## 2018-03-18 MED ORDER — ENOXAPARIN SODIUM 40 MG/0.4ML ~~LOC~~ SOLN
40.0000 mg | SUBCUTANEOUS | Status: DC
  Administered 2018-03-18: 40 mg via SUBCUTANEOUS
  Filled 2018-03-18: qty 0.4

## 2018-03-18 MED ORDER — MAGNESIUM SULFATE 2 GM/50ML IV SOLN
2.0000 g | Freq: Once | INTRAVENOUS | Status: AC
  Administered 2018-03-18: 2 g via INTRAVENOUS
  Filled 2018-03-18: qty 50

## 2018-03-18 MED ORDER — NITROGLYCERIN 2 % TD OINT
1.0000 [in_us] | TOPICAL_OINTMENT | Freq: Four times a day (QID) | TRANSDERMAL | Status: AC
  Administered 2018-03-18: 1 [in_us] via TOPICAL
  Filled 2018-03-18: qty 1

## 2018-03-18 MED ORDER — SODIUM CHLORIDE 0.45 % IV SOLN
INTRAVENOUS | Status: DC
  Administered 2018-03-18: 14:00:00 via INTRAVENOUS

## 2018-03-18 MED ORDER — ASPIRIN EC 81 MG PO TBEC
81.0000 mg | DELAYED_RELEASE_TABLET | Freq: Every day | ORAL | Status: DC
  Administered 2018-03-19 – 2018-03-20 (×2): 81 mg via ORAL
  Filled 2018-03-18 (×2): qty 1

## 2018-03-18 MED ORDER — ONDANSETRON HCL 4 MG/2ML IJ SOLN: 4.0000 mg | Freq: Four times a day (QID) | INTRAMUSCULAR | Status: DC | PRN

## 2018-03-18 MED ORDER — AMLODIPINE BESYLATE 5 MG PO TABS
5.0000 mg | ORAL_TABLET | Freq: Every day | ORAL | Status: DC
  Administered 2018-03-18 – 2018-03-20 (×3): 5 mg via ORAL
  Filled 2018-03-18 (×3): qty 1

## 2018-03-18 MED ORDER — LORAZEPAM 2 MG/ML IJ SOLN: 1.0000 mg | INTRAMUSCULAR | Status: DC | PRN

## 2018-03-18 MED ORDER — ONDANSETRON HCL 4 MG PO TABS: 4.0000 mg | ORAL_TABLET | Freq: Four times a day (QID) | ORAL | Status: DC | PRN

## 2018-03-18 NOTE — Progress Notes (Addendum)
ANTICOAGULATION CONSULT NOTE - Initial Consult  Pharmacy Consult for heparin Indication: chest pain/ACS  Allergies  Allergen Reactions  . Propoxyphene N-Acetaminophen Nausea Only    REACTION: GI upset    Patient Measurements: Height: 5\' 10"  (177.8 cm) Weight: 165 lb (74.8 kg) IBW/kg (Calculated) : 73  Vital Signs: Temp: 97.4 F (36.3 C) (10/04 1604) Temp Source: Oral (10/04 1604) BP: 153/109 (10/04 1604) Pulse Rate: 62 (10/04 1604)  Labs: Recent Labs    03/18/18 0741 03/18/18 1723  HGB 16.3  --   HCT 49.6  --   PLT 276  --   CREATININE 1.41*  --   TROPONINI  --  0.35*    Estimated Creatinine Clearance: 59.7 mL/min (A) (by C-G formula based on SCr of 1.41 mg/dL (H)).   Medical History: Past Medical History:  Diagnosis Date  . Cocaine abuse (HCC)   . Complication of anesthesia   . H/O ventricular fibrillation 2016   s/p cocaine use  . High cholesterol   . Hypertension   . MI (mitral incompetence)   . Myocardial infarction (HCC) 2016; ?06/2017   "both related to cocaine" (03/18/2018)  . Nonobstructive atherosclerosis of coronary artery 2016   LHC showed normal EF and mild non-obstructive CAD    Medications:  Medications Prior to Admission  Medication Sig Dispense Refill Last Dose  . amLODipine (NORVASC) 5 MG tablet Take 1 tablet (5 mg total) by mouth daily. 30 tablet 3 03/15/2018  . aspirin EC 81 MG tablet Take 1 tablet (81 mg total) by mouth daily. 30 tablet 0 03/18/2018 at Unknown time  . isosorbide mononitrate (IMDUR) 30 MG 24 hr tablet Take 1 tablet (30 mg total) by mouth daily. 30 tablet 2 03/15/2018  . losartan (COZAAR) 25 MG tablet Take 1 tablet (25 mg total) by mouth daily. 30 tablet 2 03/15/2018  . nitroGLYCERIN (NITROSTAT) 0.4 MG SL tablet Place 1 tablet (0.4 mg total) under the tongue every 5 (five) minutes as needed for chest pain (for 3 doses). 30 tablet 0 unknown at prn  . atorvastatin (LIPITOR) 80 MG tablet Take 1 tablet (80 mg total) by mouth daily  at 6 PM. (Patient not taking: Reported on 03/18/2018) 30 tablet 0 Not Taking at Unknown time  . clopidogrel (PLAVIX) 75 MG tablet Take 1 tablet (75 mg total) by mouth daily with breakfast. (Patient not taking: Reported on 03/18/2018) 30 tablet 0 Not Taking at Unknown time  . lidocaine (LIDODERM) 5 % Place 1 patch onto the skin daily. Remove & Discard patch within 12 hours or as directed by MD (Patient not taking: Reported on 03/18/2018) 30 patch 0 Not Taking at Unknown time    Assessment: 57 y/o male active cocaine abuser who presented to the ED with chest pain. Of note, he had an NSTEMI in July thought to be due to cocaine induced coronary vasospasm; two previous caths showed mild nonobstructive CAD and normal LVEF. Troponin trending up, 0.02 > 0.2 > 0.35. Pharmacy consulted to begin IV heparin drip. No bleeding noted, CBC is normal. Enoxaparin 40 mg SQ given at 16:40 so will not bolus.   Goal of Therapy:  Heparin level 0.3-0.7 units/ml Monitor platelets by anticoagulation protocol: Yes   Plan:  - Begin heparin drip at 900 units/hr with no bolus - 6 hr heparin level - Daily heparin level and CBC - Monitor for s/sx of bleeding   Loura Back, PharmD, BCPS Clinical Pharmacist Clinical phone for 03/18/2018 until 10p is [phone] Please check AMION for  all Pharmacist numbers by unit 03/18/2018 8:32 PM

## 2018-03-18 NOTE — ED Triage Notes (Signed)
Per EMS pt was at home and developed central chest pain 10/10 around 0630.  Pt received 2 nitro tabs and 324 of Asprin.  Pt has hx of MI early this year.  Pt is AOx4 NAD noted at this time.

## 2018-03-18 NOTE — Consult Note (Addendum)
Cardiology Consultation:   Patient ID: Bryce Ortega MRN: [id number]; DOB: 1960/10/15  Admit date: 03/18/2018 Date of Consult: 03/18/2018  Primary Care Provider: Lavinia Sharps, NP Primary Cardiologist: Lesleigh Noe, MD   Patient Profile:   Bryce Ortega is a 57 y.o. male with a hx of habitual cocaine abuse, nonobstructive coronary disease, hypertension, ventricular fibrillation arrest in the setting of cocaine use in 2016 and hyperlipidemia who is being seen today for the evaluation of chest pain and elevated troponin, at the request of Dr. Robb Matar, Internal Medicine.   History of Present Illness:   Bryce Ortega is a 57 y.o. male with a hx of habitual cocaine abuse, nonobstructive coronary disease, hypertension, ventricular fibrillation arrest in the setting of cocaine use in 2016 and hyperlipidemia who is being seen today for the evaluation of chest pain and elevated troponin at the request of Dr. Robb Matar, Internal Medicine.   As noted above, he was admitted in 2016 for VF arrest, in the setting of cocaine use. Pt underwent LHC on 05/10/15 which showed mild non-obstructive CAD w/ 40% mid RCA stenosis and 30% distal LAD stenosis. LVEF was normal. His VF arrest was presumed to be likely secondary to cocaine-induced vasospasm. Pt was advised to stop cocaine use. Given mild CAD, pt also treated with ASA and stain. No  blocker secondary to cocaine use.   Unfortunately, pt has been a habitual cocaine user. He was readmitted this summer in July for CP and ruled in for NSTEMI w/ troponin peaking at 16.14. He had tested positive for cocaine. He underwent repeat cardiac catheterization which showed widely patent coronary arteries, w/o significant change from prior study. Still 30-40% mid eccentric narrowing of the LAD after the second diagonal and 40% disease in the 2nd and 3rd OM branches. Echo showed normal LVEF. Again, cocaine induced coronary vasospasm was felt to be the cause of his CP  and troponin elevation. Dr. Katrinka Blazing recommended in cath report to treat with DAPT w/ ASA and Plavix for a minimum of 6 months. Pt was discharged on 12/28/17. No outpatient cardiac f/u since then.   Pt now presents back to [facility] with same presentation. CP, cocaine use (+UDS) and elevated POC troponin at 0.20.  EKG shows sinus rhythm, abnormal R-wave progression, early transition, minimal ST elevation, inferior leads. CXR negative. CBC normal. BMP notable for renal insuffiencey w/ SCr at 1.41. Also hyperglycemia with glucose at 154. BP mild-moderately elevated at 146/91.   On my evaluation, he is asleep and resting comfortably but awakes to answer questions. He reports onset of chest pain this morning since 6AM. Described as chest tightness. Similar to previous CP episodes. Fairly constant. 6/10 pain. No dyspnea. He reports he had been compliant with his medications until running out 2 days ago.   Past Medical History:  Diagnosis Date  . Cocaine abuse (HCC)   . H/O ventricular fibrillation 2016   s/p cocaine use  . High cholesterol   . Hypertension   . MI (mitral incompetence)   . Myocardial infarction [facility]) 2016   "related to cocaine"  . Nonobstructive atherosclerosis of coronary artery 2016   LHC showed normal EF and mild non-obstructive CAD    Past Surgical History:  Procedure Laterality Date  . CARDIAC CATHETERIZATION N/A 05/10/2015   Procedure: Left Heart Cath and Coronary Angiography;  Surgeon: Dolores Patty, MD;  Location: [facility] INVASIVE CV LAB;  Service: Cardiovascular;  Laterality: N/A;  . LEFT HEART CATH AND CORONARY ANGIOGRAPHY N/A  12/27/2017   Procedure: LEFT HEART CATH AND CORONARY ANGIOGRAPHY;  Surgeon: Lyn Records, MD;  Location: [facility] INVASIVE CV LAB;  Service: Cardiovascular;  Laterality: N/A;  . TONSILLECTOMY       Home Medications:  Prior to Admission medications   Medication Sig Start Date End Date Taking? Authorizing Provider  amLODipine (NORVASC) 5 MG tablet Take 1  tablet (5 mg total) by mouth daily. 07/07/17  Yes Rama, Maryruth Bun, MD  aspirin EC 81 MG tablet Take 1 tablet (81 mg total) by mouth daily. 05/11/15  Yes Ghimire, Werner Lean, MD  isosorbide mononitrate (IMDUR) 30 MG 24 hr tablet Take 1 tablet (30 mg total) by mouth daily. 07/07/17  Yes Rama, Maryruth Bun, MD  losartan (COZAAR) 25 MG tablet Take 1 tablet (25 mg total) by mouth daily. 07/07/17  Yes Rama, Maryruth Bun, MD  nitroGLYCERIN (NITROSTAT) 0.4 MG SL tablet Place 1 tablet (0.4 mg total) under the tongue every 5 (five) minutes as needed for chest pain (for 3 doses). 07/07/17  Yes Rama, Maryruth Bun, MD  atorvastatin (LIPITOR) 80 MG tablet Take 1 tablet (80 mg total) by mouth daily at 6 PM. Patient not taking: Reported on 03/18/2018 12/28/17   Leatha Gilding, MD  clopidogrel (PLAVIX) 75 MG tablet Take 1 tablet (75 mg total) by mouth daily with breakfast. Patient not taking: Reported on 03/18/2018 12/29/17   Leatha Gilding, MD  lidocaine (LIDODERM) 5 % Place 1 patch onto the skin daily. Remove & Discard patch within 12 hours or as directed by MD Patient not taking: Reported on 03/18/2018 09/30/17   Petrucelli, Pleas Koch, PA-C    Inpatient Medications: Scheduled Meds:  Continuous Infusions: . sodium chloride 88 mL/hr at 03/18/18 1339  . magnesium sulfate 1 - 4 g bolus IVPB 2 g (03/18/18 1339)   PRN Meds: LORazepam  Allergies:    Allergies  Allergen Reactions  . Propoxyphene N-Acetaminophen Nausea Only    REACTION: GI upset    Social History:   Social History   Socioeconomic History  . Marital status: Divorced    Spouse name: Not on file  . Number of children: Not on file  . Years of education: Not on file  . Highest education level: Not on file  Occupational History  . Not on file  Social Needs  . Financial resource strain: Not on file  . Food insecurity:    Worry: Not on file    Inability: Not on file  . Transportation needs:    Medical: Not on file    Non-medical: Not on  file  Tobacco Use  . Smoking status: Never Smoker  . Smokeless tobacco: Never Used  Substance and Sexual Activity  . Alcohol use: Yes    Alcohol/week: 13.0 standard drinks    Types: 13 Cans of beer per week    Comment: 07/06/2017 "~ 2 40 oz beers 2x/week"  . Drug use: Yes    Types: Cocaine    Comment: 07/06/2017 "weekly"  . Sexual activity: Yes  Lifestyle  . Physical activity:    Days per week: Not on file    Minutes per session: Not on file  . Stress: Not on file  Relationships  . Social connections:    Talks on phone: Not on file    Gets together: Not on file    Attends religious service: Not on file    Active member of club or organization: Not on file    Attends meetings of clubs or  organizations: Not on file    Relationship status: Not on file  . Intimate partner violence:    Fear of current or ex partner: Not on file    Emotionally abused: Not on file    Physically abused: Not on file    Forced sexual activity: Not on file  Other Topics Concern  . Not on file  Social History Narrative   ** Merged History Encounter **        Family History:    Family History  Problem Relation Age of Onset  . Hypertension Father   . CAD Father        s/p CABG  . Heart disease Mother   . Heart disease Maternal Grandfather      ROS:  Please see the history of present illness.   All other ROS reviewed and negative.     Physical Exam/Data:   Vitals:   03/18/18 0915 03/18/18 1015 03/18/18 1245 03/18/18 1315  BP: (!) 148/99 (!) 143/83 (!) 161/107 (!) 143/103  Pulse: (!) 57 (!) 55 68 (!) 52  Resp: 16 16 16 16   Temp:      TempSrc:      SpO2: 96% 96% 97% 98%  Weight:      Height:       No intake or output data in the 24 hours ending 03/18/18 1340 Filed Weights   03/18/18 0732  Weight: 74.8 kg   Body mass index is 23.68 kg/m.  General:  Middle aged thin white male in NAD HEENT: normal Lymph: no adenopathy Neck: no JVD Endocrine:  No thryomegaly Vascular: No  carotid bruits; FA pulses 2+ bilaterally without bruits  Cardiac:  normal S1, S2; RRR; no murmur  Lungs:  clear to auscultation bilaterally, no wheezing, rhonchi or rales  Abd: soft, nontender, no hepatomegaly  Ext: no edema Musculoskeletal:  No deformities, BUE and BLE strength normal and equal Skin: warm and dry  Neuro:  CNs 2-12 intact, no focal abnormalities noted Psych:  Normal affect   EKG:  The EKG was personally reviewed and demonstrates:  Sinus rhythm, Abnormal R-wave progression, early transition, Minimal ST elevation, inferior leads Telemetry:  Telemetry was personally reviewed and demonstrates:  NSR  Relevant CV Studies:  [facility] 12/27/17 LEFT HEART CATH AND CORONARY ANGIOGRAPHY  Conclusion    Widely patent coronary arteries.  Coronary anatomy.  Left main is normal.  The LAD contains 30 to 40% mid eccentric narrowing after the second diagonal.  The LAD does not wraparound the apex.  The distal segment demonstrates some evidence of systolic compression.  The circumflex is widely patent.  The second and third obtuse marginals contained 40% ostial narrowing.  Dominant vessel with eccentric 30% mid vessel narrowing.  Non-ST elevation myocardial infarction affecting the anterolateral wall, possibly related to coronary vasospasm in the setting of cocaine use versus atypical Takotsubo stress cardiomyopathy.  RECOMMENDATIONS:   Risk factor modification: Cocaine, blood pressure, glycemic, lipid, and tobacco (if any) control.    Frankly discussed the hazards of continued cocaine use given prior VF arrest and non-ST elevation MI on this occasion with wall motion abnormality.  Discontinue IV nitroglycerin.  Consider dual antiplatelet therapy for 6-12 months   2D Echo 12/27/17  Study Conclusions  - Left ventricle: The cavity size was normal. Wall thickness was   normal. Systolic function was normal. The estimated ejection   fraction was in the range of 60% to 65%. Wall  motion was normal;   there were no regional wall  motion abnormalities. Doppler   parameters are consistent with abnormal left ventricular   relaxation (grade 1 diastolic dysfunction). - Aortic valve: There was trivial regurgitation.  Impressions:  - Normal LV function; mild diastolic dysfunction; trace AI.    Laboratory Data:  Chemistry Recent Labs  Lab 03/18/18 0741  NA 138  K 4.5  CL 108  CO2 24  GLUCOSE 154*  BUN 10  CREATININE 1.41*  CALCIUM 8.4*  GFRNONAA 54*  GFRAA >60  ANIONGAP 6    Recent Labs  Lab 03/18/18 0741  PROT 5.3*  ALBUMIN 3.1*  AST 28  ALT 24  ALKPHOS 55  BILITOT 0.8   Hematology Recent Labs  Lab 03/18/18 0741  WBC 6.8  RBC 5.19  HGB 16.3  HCT 49.6  MCV 95.6  MCH 31.4  MCHC 32.9  RDW 13.4  PLT 276   Cardiac EnzymesNo results for input(s): TROPONINI in the last 168 hours.  Recent Labs  Lab 03/18/18 0748 03/18/18 1153  TROPIPOC 0.02 0.20*    BNPNo results for input(s): BNP, PROBNP in the last 168 hours.  DDimer No results for input(s): DDIMER in the last 168 hours.  Radiology/Studies:  Dg Chest 2 View  Result Date: 03/18/2018 CLINICAL DATA:  Chest pain and shortness of breath EXAM: CHEST - 2 VIEW COMPARISON:  December 25, 2017 FINDINGS: The lungs are clear. Heart size and pulmonary vascularity are normal. No adenopathy. No evident bone lesions. No pneumothorax. IMPRESSION: No edema or consolidation. Electronically Signed   By: Bretta Bang III M.D.   On: 03/18/2018 08:17    Assessment and Plan:   Bryce Ortega is a 57 y.o. male with a hx of habitual cocaine abuse, nonobstructive coronary disease, hypertension, ventricular fibrillation arrest in the setting of cocaine use in 2016 and hyperlipidemia who is being seen today for the evaluation of chest pain and elevated troponin, at the request of Dr. Robb Matar, Internal Medicine.   1. Chest Pain (presume cocaine induced coronary vasospasm) : pt with several prior  hospitalizations for chest pain and elevated troponin in the setting of cocaine use. 2 caths, one in 2016 and most recent in July of this year, both showing mild nonobstructive CAD and normal LVEF. Prior CP episodes felt caused by cocaine induced coronary vasospasm. Now with recurrent CP after recent cocaine use. UDS +. Symptom onset earlier this morning. SSC tightness. Constant 6/10 pain (appears comfortable on examination). VSS. EKG w/o significant ST elevation. Initial POC troponin 0.20. Suspect coronary vasospasm. Hospitalist to admit for observation. Continue to cycle troponins x 3 to assess trend. No plans for repeat cath, unless significant enzyme elevation. Otherwise, if troponin's remain relatively flat, will plan on treadmill exercise stress test in the morning to r/o ischemia as he does have a h/o CAD (rule out ischemia/disease progression). For now, continue medical management with ASA, Plavix, statin and amlodipine. No  blocker given habitual cocaine use. Will add low dose oral nitrate, isordil, to amlodipine for vasospams. Stop ARB if BP drops  ? benzodiazepines per IM. Complete cessation from further cocaine use imperative, especially given his VF arrest from vasospasm in 2016. MD to follow with further recommendations.     For questions or updates, please contact [facility] Please consult [email or web address] for contact info under     Signed, Robbie Lis, PA-C  03/18/2018 1:40 PM   Patient examined chart reviewed discussed care with PA. 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 Exam with disheveled white male clear lungs no murmurs soft abdomen and no edema with good peripheral pulses Would add nitrates for potential spasm in setting cocaine use avoid beta blockers if no trend / elevation in troponin can consider ETT in am   Charlton Haws

## 2018-03-18 NOTE — ED Provider Notes (Signed)
[facility] EMERGENCY DEPARTMENT Provider Note   CSN: [id number] Arrival date & time: 03/18/18  4098     History   Chief Complaint Chief Complaint  Patient presents with  . Chest Pain    HPI Bryce Ortega is a 57 y.o. male.  The history is provided by medical records and the patient. No language interpreter was used.  Chest Pain   This is a recurrent problem. The current episode started yesterday. The problem occurs constantly. The problem has been rapidly improving. The pain is associated with exertion. The pain is present in the substernal region. The pain is at a severity of 10/10. The pain is severe. The quality of the pain is described as exertional, heavy and pressure-like. The pain radiates to the left shoulder. Duration of episode(s) is 2 days. The symptoms are aggravated by exertion. Associated symptoms include diaphoresis, exertional chest pressure and shortness of breath. Pertinent negatives include no abdominal pain, no back pain, no claudication, no cough, no dizziness, no fever, no headaches, no irregular heartbeat, no leg pain, no lower extremity edema, no malaise/fatigue, no nausea, no numbness, no palpitations and no vomiting. He has tried nitroglycerin for the symptoms. The treatment provided significant relief. Risk factors include male gender.  His past medical history is significant for MI.    Past Medical History:  Diagnosis Date  . Cocaine abuse (HCC)   . H/O ventricular fibrillation 2016   s/p cocaine use  . High cholesterol   . Hypertension   . MI (mitral incompetence)   . Myocardial infarction [facility]) 2016   "related to cocaine"  . Nonobstructive atherosclerosis of coronary artery 2016   LHC showed normal EF and mild non-obstructive CAD    Patient Active Problem List   Diagnosis Date Noted  . AKI (acute kidney injury) (HCC) 07/07/2017  . Ischemia, myocardial, acute (HCC) 07/06/2017  . Elevated troponin   . Cocaine adverse  reaction 02/23/2017  . Chest pain 02/22/2017  . Mild CAD 02/22/2017  . NSTEMI (non-ST elevated myocardial infarction) (HCC) 05/15/2015  . Cocaine abuse (HCC) 05/15/2015  . Chest pain at rest   . Cardiac arrest (HCC) 05/10/2015  . Hyperlipidemia 05/10/2015  . Essential hypertension 01/10/2009  . SHOULDER PAIN, RIGHT 01/10/2009  . CERVICALGIA 01/10/2009  . LOW BACK PAIN 01/10/2009    Past Surgical History:  Procedure Laterality Date  . CARDIAC CATHETERIZATION N/A 05/10/2015   Procedure: Left Heart Cath and Coronary Angiography;  Surgeon: Dolores Patty, MD;  Location: [facility] INVASIVE CV LAB;  Service: Cardiovascular;  Laterality: N/A;  . LEFT HEART CATH AND CORONARY ANGIOGRAPHY N/A 12/27/2017   Procedure: LEFT HEART CATH AND CORONARY ANGIOGRAPHY;  Surgeon: Lyn Records, MD;  Location: [facility] INVASIVE CV LAB;  Service: Cardiovascular;  Laterality: N/A;  . TONSILLECTOMY          Home Medications    Prior to Admission medications   Medication Sig Start Date End Date Taking? Authorizing Provider  amLODipine (NORVASC) 5 MG tablet Take 1 tablet (5 mg total) by mouth daily. 07/07/17   Rama, Maryruth Bun, MD  aspirin EC 81 MG tablet Take 1 tablet (81 mg total) by mouth daily. 05/11/15   Ghimire, Werner Lean, MD  atorvastatin (LIPITOR) 80 MG tablet Take 1 tablet (80 mg total) by mouth daily at 6 PM. 12/28/17   Leatha Gilding, MD  clopidogrel (PLAVIX) 75 MG tablet Take 1 tablet (75 mg total) by mouth daily with breakfast. 12/29/17   Elvera Lennox, [employer]  M, MD  isosorbide mononitrate (IMDUR) 30 MG 24 hr tablet Take 1 tablet (30 mg total) by mouth daily. 07/07/17   Rama, Maryruth Bun, MD  lidocaine (LIDODERM) 5 % Place 1 patch onto the skin daily. Remove & Discard patch within 12 hours or as directed by MD 09/30/17   Petrucelli, Lelon Mast R, PA-C  losartan (COZAAR) 25 MG tablet Take 1 tablet (25 mg total) by mouth daily. 07/07/17   Rama, Maryruth Bun, MD  nitroGLYCERIN (NITROSTAT) 0.4 MG SL tablet Place 1  tablet (0.4 mg total) under the tongue every 5 (five) minutes as needed for chest pain (for 3 doses). 07/07/17   Rama, Maryruth Bun, MD    Family History Family History  Problem Relation Age of Onset  . Hypertension Father   . CAD Father        s/p CABG  . Heart disease Mother   . Heart disease Maternal Grandfather     Social History Social History   Tobacco Use  . Smoking status: Never Smoker  . Smokeless tobacco: Never Used  Substance Use Topics  . Alcohol use: Yes    Alcohol/week: 13.0 standard drinks    Types: 13 Cans of beer per week    Comment: 07/06/2017 "~ 2 40 oz beers 2x/week"  . Drug use: Yes    Types: Cocaine    Comment: 07/06/2017 "weekly"     Allergies   Propoxyphene n-acetaminophen   Review of Systems Review of Systems  Constitutional: Positive for diaphoresis. Negative for chills, fatigue, fever and malaise/fatigue.  HENT: Negative for congestion.   Eyes: Negative for visual disturbance.  Respiratory: Positive for chest tightness and shortness of breath. Negative for cough and choking.   Cardiovascular: Positive for chest pain. Negative for palpitations, claudication and leg swelling.  Gastrointestinal: Negative for abdominal pain, constipation, diarrhea, nausea and vomiting.  Genitourinary: Negative for dysuria.  Musculoskeletal: Negative for back pain, neck pain and neck stiffness.  Skin: Negative for rash and wound.  Neurological: Negative for dizziness, light-headedness, numbness and headaches.  Psychiatric/Behavioral: Negative for agitation.  All other systems reviewed and are negative.    Physical Exam Updated Vital Signs BP (!) 153/109   Pulse 62   Temp (!) 97.4 F (36.3 C) (Oral)   Resp 13   Ht 5\' 10"  (1.778 m)   Wt 74.8 kg   SpO2 98%   BMI 23.68 kg/m   Physical Exam  Constitutional: He is oriented to person, place, and time. He appears well-developed and well-nourished.  Non-toxic appearance. He does not appear ill. No distress.    HENT:  Head: Normocephalic and atraumatic.  Mouth/Throat: Oropharynx is clear and moist.  Eyes: Pupils are equal, round, and reactive to light. Conjunctivae are normal.  Neck: Normal range of motion. Neck supple.  Cardiovascular: Normal rate, regular rhythm and intact distal pulses.  No murmur heard. Pulmonary/Chest: Effort normal and breath sounds normal. No respiratory distress. He has no decreased breath sounds. He has no wheezes. He has no rhonchi. He has no rales. He exhibits no tenderness.  Abdominal: Soft. There is no tenderness.  Musculoskeletal: He exhibits no edema or tenderness.  Neurological: He is alert and oriented to person, place, and time.  Skin: Skin is warm and dry. He is not diaphoretic. No erythema. No pallor.  Psychiatric: He has a normal mood and affect.  Nursing note and vitals reviewed.    ED Treatments / Results  Labs (all labs ordered are listed, but only abnormal results are displayed)  Labs Reviewed  BASIC METABOLIC PANEL - Abnormal; Notable for the following components:      Result Value   Glucose, Bld 154 (*)    Creatinine, Ser 1.41 (*)    Calcium 8.4 (*)    GFR calc non Af Amer 54 (*)    All other components within normal limits  RAPID URINE DRUG SCREEN, HOSP PERFORMED - Abnormal; Notable for the following components:   Cocaine POSITIVE (*)    All other components within normal limits  HEPATIC FUNCTION PANEL - Abnormal; Notable for the following components:   Total Protein 5.3 (*)    Albumin 3.1 (*)    Bilirubin, Direct 0.3 (*)    All other components within normal limits  I-STAT TROPONIN, ED - Abnormal; Notable for the following components:   Troponin i, poc 0.20 (*)    All other components within normal limits  MRSA PCR SCREENING  CBC  LIPASE, BLOOD  TROPONIN I  TROPONIN I  MAGNESIUM  PHOSPHORUS  TROPONIN I  HIV ANTIBODY (ROUTINE TESTING W REFLEX)  I-STAT TROPONIN, ED    EKG EKG Interpretation  Date/Time:  Friday March 18 2018  07:29:19 EDT Ventricular Rate:  63 PR Interval:    QRS Duration: 82 QT Interval:  433 QTC Calculation: 444 R Axis:   59 Text Interpretation:  Sinus rhythm Abnormal R-wave progression, early transition Minimal ST elevation, inferior leads When compared to prior,  t wave inversion more prominent in lead AVL.  No STEMI Confirmed by Theda Belfast ([id number]) on 03/18/2018 8:02:21 AM Also confirmed by Theda Belfast ([id number]), editor Elita Quick [phone])  on 03/18/2018 8:29:23 AM   Radiology Dg Chest 2 View  Result Date: 03/18/2018 CLINICAL DATA:  Chest pain and shortness of breath EXAM: CHEST - 2 VIEW COMPARISON:  December 25, 2017 FINDINGS: The lungs are clear. Heart size and pulmonary vascularity are normal. No adenopathy. No evident bone lesions. No pneumothorax. IMPRESSION: No edema or consolidation. Electronically Signed   By: Bretta Bang III M.D.   On: 03/18/2018 08:17    Procedures Procedures (including critical care time)  CRITICAL CARE Performed by: Canary Brim Jaiana Sheffer Total critical care time: 35 minutes Critical care time was exclusive of separately billable procedures and treating other patients. Critical care was necessary to treat or prevent imminent or life-threatening deterioration. Chest pain with positive troponin. Critical care was time spent personally by me on the following activities: development of treatment plan with patient and/or surrogate as well as nursing, discussions with consultants, evaluation of patient's response to treatment, examination of patient, obtaining history from patient or surrogate, ordering and performing treatments and interventions, ordering and review of laboratory studies, ordering and review of radiographic studies, pulse oximetry and re-evaluation of patient's condition.   Medications Ordered in ED Medications  0.45 % sodium chloride infusion ( Intravenous New Bag/Given 03/18/18 1339)  LORazepam (ATIVAN) injection 1 mg (has no  administration in time range)  amLODipine (NORVASC) tablet 5 mg (5 mg Oral Given 03/18/18 1440)  aspirin EC tablet 81 mg (81 mg Oral Not Given 03/18/18 1554)  isosorbide mononitrate (IMDUR) 24 hr tablet 30 mg (has no administration in time range)  losartan (COZAAR) tablet 25 mg (has no administration in time range)  ondansetron (ZOFRAN) tablet 4 mg (has no administration in time range)    Or  ondansetron (ZOFRAN) injection 4 mg (has no administration in time range)  enoxaparin (LOVENOX) injection 40 mg (has no administration in time range)  nitroGLYCERIN (NITROSTAT) SL tablet 0.4  mg (0.4 mg Sublingual Given 03/18/18 0824)  LORazepam (ATIVAN) injection 1 mg (1 mg Intravenous Given 03/18/18 1240)  magnesium sulfate IVPB 2 g 50 mL (2 g Intravenous New Bag/Given 03/18/18 1339)  nitroGLYCERIN (NITROGLYN) 2 % ointment 1 inch (1 inch Topical Given 03/18/18 1441)     Initial Impression / Assessment and Plan / ED Course  I have reviewed the triage vital signs and the nursing notes.  Pertinent labs & imaging results that were available during my care of the patient were reviewed by me and considered in my medical decision making (see chart for details).     AVIYON HOCEVAR is a 57 y.o. male with a past medical history significant for hypertension, hyperlipidemia, prior N STEMI related to cocaine abuse and prior cardiac arrest who presents with chest pain.  He reports that he used cocaine yesterday afternoon and several hours later began having chest pain.  He describes a similar to his prior MI.  He reports the pain is pressure in his central chest abrasion to his left shoulder.  He reports associated shortness of breath and diaphoresis.  He reports it is exertional.  He says he had no episodes of vomiting or nausea.  He denies recent leg pain or leg swelling.  No history of DVT or PE.  He reports he has been out of his blood pressure medicine for the last 2 days.  He denies other complaints on arrival.  He  reports this morning he woke up at 6:30 AM with the continued chest pain.  He reports he called EMS and received 2 nitroglycerin which took his pain from a 10 out of 10 severity after 3 out of 10 currently.  He reports he took 324 aspirin.  He reports his pain continues to improve but he still concerned about his discomfort as it feels similar to his prior MI this year.    On exam, chest is nontender.  Lungs are clear.  No murmur.  Abdomen is nontender.  Legs have no significant edema.  Patient resting comfortably on room air.  EKG appears similar to prior with only mild T wave inversion in aVL compared to prior.  Clinical I suspect patient is having chest pain related to his cocaine use yesterday.  Patient will have troponin trended as well as labs and imaging.  Anticipate touch base with cardiology when work-up was completed.  Patient given another sublingual nitroglycerin to try to alleviate his discomfort.  Patient's pain resolved with nitroglycerin.  He separately had recurrent pain and he was given Ativan which helped his pain.  Patient's second troponin turn positive pericardial G was called and requested admission to medicine service.  They will see the patient in consultation as well.  Suspect cocaine related chest pain.  Chest x-ray was unremarkable.  Patient admitted for further management and was chest pain-free on admission.  Final Clinical Impressions(s) / ED Diagnoses   Final diagnoses:  Chest pain, unspecified type  Elevated troponin    ED Discharge Orders    None      Clinical Impression: 1. Chest pain, unspecified type   2. Elevated troponin     Disposition: Admit  This note was prepared with assistance of Dragon voice recognition software. Occasional wrong-word or sound-a-like substitutions may have occurred due to the inherent limitations of voice recognition software.     Jamone Garrido, Canary Brim, MD 03/18/18 [phone]

## 2018-03-18 NOTE — H&P (Signed)
History and Physical    HANS FREIRE QQP:[id number] DOB: 1960-08-25 DOA: 03/18/2018  PCP: Lavinia Sharps, NP   Patient coming from: Home.  I have personally briefly reviewed patient's old medical records in [facility]  Chief Complaint: Chest pain.  HPI: Bryce Ortega is a 57 y.o. male with medical history significant of cocaine abuse, history of ventricular fibrillation/cardiac arrest after cocaine use, mitral insufficiency, nonobstructive CAD, history of MI due to cocaine abuse, hyperlipidemia, hypertension who is coming to the emergency department due to chest pain after using "a small amount" snorted cocaine yesterday evening.  The patient states he went to sleep and woke up this morning around 630 with central chest pain, pressure-like, worsened by exertion, radiated to his left shoulder, associated with dyspnea and diaphoresis.  He denies nausea, emesis, dizziness or palpitations.  He denies PND, orthopnea or pitting edema of the lower extremities.  No abdominal pain, diarrhea, constipation, melena or hematochezia he denies dysuria, frequency or hematuria no heat or cold intolerance.  No polyuria, polydipsia, polyphagia or blurred vision.  Denies skin rashes.  ED Course: Initial vital signs temperature 97.9 F, pulse 66, respirations 18, blood pressure 138/94 mmHg and O2 sat 98% on room air.  His EKG was sinus rhythm with a normal R wave progression, early transition and minimal ST elevation on inferior leads.  His first i-STAT troponin was normal.  I-STAT troponin was 0.2 ng/mL.His urine drug screen was positive for cocaine.  His CBC was normal.  His electrolytes were normal, except for calcium of 8.4 mg/dL, which is normal when corrected to a low albumin.  Glucose was 154 and creatinine 1.41 mg/dL.  Total protein was 5.3 and albumin 3.1 g/dL, all other hepatic function tests are within normal limits.  Lipase was 30 units/L.  Review of Systems: As per HPI otherwise 10 point  review of systems negative.   Past Medical History:  Diagnosis Date  . Cocaine abuse (HCC)   . H/O ventricular fibrillation 2016   s/p cocaine use  . High cholesterol   . Hypertension   . MI (mitral incompetence)   . Myocardial infarction [facility]) 2016   "related to cocaine"  . Nonobstructive atherosclerosis of coronary artery 2016   LHC showed normal EF and mild non-obstructive CAD    Past Surgical History:  Procedure Laterality Date  . CARDIAC CATHETERIZATION N/A 05/10/2015   Procedure: Left Heart Cath and Coronary Angiography;  Surgeon: Dolores Patty, MD;  Location: [facility] INVASIVE CV LAB;  Service: Cardiovascular;  Laterality: N/A;  . LEFT HEART CATH AND CORONARY ANGIOGRAPHY N/A 12/27/2017   Procedure: LEFT HEART CATH AND CORONARY ANGIOGRAPHY;  Surgeon: Lyn Records, MD;  Location: [facility] INVASIVE CV LAB;  Service: Cardiovascular;  Laterality: N/A;  . TONSILLECTOMY       reports that he has never smoked. He has never used smokeless tobacco. He reports that he drinks about 13.0 standard drinks of alcohol per week. He reports that he has current or past drug history. Drug: Cocaine.  Allergies  Allergen Reactions  . Propoxyphene N-Acetaminophen Nausea Only    REACTION: GI upset    Family History  Problem Relation Age of Onset  . Hypertension Father   . CAD Father        s/p CABG  . Heart disease Mother   . Heart disease Maternal Grandfather    Prior to Admission medications   Medication Sig Start Date End Date Taking? Authorizing Provider  amLODipine (NORVASC) 5 MG  tablet Take 1 tablet (5 mg total) by mouth daily. 07/07/17  Yes Rama, Maryruth Bun, MD  aspirin EC 81 MG tablet Take 1 tablet (81 mg total) by mouth daily. 05/11/15  Yes Ghimire, Werner Lean, MD  isosorbide mononitrate (IMDUR) 30 MG 24 hr tablet Take 1 tablet (30 mg total) by mouth daily. 07/07/17  Yes Rama, Maryruth Bun, MD  losartan (COZAAR) 25 MG tablet Take 1 tablet (25 mg total) by mouth daily. 07/07/17  Yes Rama,  Maryruth Bun, MD  nitroGLYCERIN (NITROSTAT) 0.4 MG SL tablet Place 1 tablet (0.4 mg total) under the tongue every 5 (five) minutes as needed for chest pain (for 3 doses). 07/07/17  Yes Rama, Maryruth Bun, MD  atorvastatin (LIPITOR) 80 MG tablet Take 1 tablet (80 mg total) by mouth daily at 6 PM. Patient not taking: Reported on 03/18/2018 12/28/17   Leatha Gilding, MD  clopidogrel (PLAVIX) 75 MG tablet Take 1 tablet (75 mg total) by mouth daily with breakfast. Patient not taking: Reported on 03/18/2018 12/29/17   Leatha Gilding, MD  lidocaine (LIDODERM) 5 % Place 1 patch onto the skin daily. Remove & Discard patch within 12 hours or as directed by MD Patient not taking: Reported on 03/18/2018 09/30/17   Cherly Anderson, PA-C    Physical Exam: Vitals:   03/18/18 1315 03/18/18 1345 03/18/18 1415 03/18/18 1445  BP: (!) 143/103 (!) 139/107 (!) 146/91 118/76  Pulse: (!) 52 61 63 66  Resp: 16 11 15 13   Temp:      TempSrc:      SpO2: 98% 99% 96% 99%  Weight:      Height:        Constitutional: NAD, calm, comfortable Eyes: PERRL, lids and conjunctivae normal ENMT: Mucous membranes are moist. Posterior pharynx clear of any exudate or lesions. Neck: normal, supple, no masses, no thyromegaly Respiratory: clear to auscultation bilaterally, no wheezing, no crackles. Normal respiratory effort. No accessory muscle use.  Cardiovascular: Regular rate and rhythm, no murmurs / rubs / gallops. No extremity edema. 2+ pedal pulses. No carotid bruits.  Abdomen: no tenderness, no masses palpated. No hepatosplenomegaly. Bowel sounds positive.  Musculoskeletal: no clubbing / cyanosis. Good ROM, no contractures. Normal muscle tone.  Skin: no significant rashes, lesions, ulcers on limited dermatological examination.. No induration Neurologic: CN 2-12 grossly intact. Sensation intact, DTR normal. Strength 5/5 in all 4.  Psychiatric: Normal judgment and insight. Alert and oriented x 3. Normal mood.    Labs  on Admission: I have personally reviewed following labs and imaging studies  CBC: Recent Labs  Lab 03/18/18 0741  WBC 6.8  HGB 16.3  HCT 49.6  MCV 95.6  PLT 276   Basic Metabolic Panel: Recent Labs  Lab 03/18/18 0741  NA 138  K 4.5  CL 108  CO2 24  GLUCOSE 154*  BUN 10  CREATININE 1.41*  CALCIUM 8.4*   GFR: Estimated Creatinine Clearance: 59.7 mL/min (A) (by C-G formula based on SCr of 1.41 mg/dL (H)). Liver Function Tests: Recent Labs  Lab 03/18/18 0741  AST 28  ALT 24  ALKPHOS 55  BILITOT 0.8  PROT 5.3*  ALBUMIN 3.1*   Recent Labs  Lab 03/18/18 0741  LIPASE 30   No results for input(s): AMMONIA in the last 168 hours. Coagulation Profile: No results for input(s): INR, PROTIME in the last 168 hours. Cardiac Enzymes: No results for input(s): CKTOTAL, CKMB, CKMBINDEX, TROPONINI in the last 168 hours. BNP (last 3 results) No results  for input(s): PROBNP in the last 8760 hours. HbA1C: No results for input(s): HGBA1C in the last 72 hours. CBG: No results for input(s): GLUCAP in the last 168 hours. Lipid Profile: No results for input(s): CHOL, HDL, LDLCALC, TRIG, CHOLHDL, LDLDIRECT in the last 72 hours. Thyroid Function Tests: No results for input(s): TSH, T4TOTAL, FREET4, T3FREE, THYROIDAB in the last 72 hours. Anemia Panel: No results for input(s): VITAMINB12, FOLATE, FERRITIN, TIBC, IRON, RETICCTPCT in the last 72 hours. Urine analysis: No results found for: COLORURINE, APPEARANCEUR, LABSPEC, PHURINE, GLUCOSEU, HGBUR, BILIRUBINUR, KETONESUR, PROTEINUR, UROBILINOGEN, NITRITE, LEUKOCYTESUR  Radiological Exams on Admission: Dg Chest 2 View  Result Date: 03/18/2018 CLINICAL DATA:  Chest pain and shortness of breath EXAM: CHEST - 2 VIEW COMPARISON:  December 25, 2017 FINDINGS: The lungs are clear. Heart size and pulmonary vascularity are normal. No adenopathy. No evident bone lesions. No pneumothorax. IMPRESSION: No edema or consolidation. Electronically Signed    By: Bretta Bang III M.D.   On: 03/18/2018 08:17    EKG: Independently reviewed.  Vent. rate 63 BPM PR interval * ms QRS duration 82 ms QT/QTc 433/444 ms P-R-T axes 60 59 72 Sinus rhythm Abnormal R-wave progression, early transition Minimal ST elevation, inferior leads  Assessment/Plan Principal Problem:   Chest pain Observation/telemetry. Trend troponin level. Resume aspirin 81 mg p.o. daily. Avoid beta-blockers. Nitropaste x1 dose to anterior chest wall. Lorazepam as needed for anxiety. Per ED consult, cardiology will be evaluating the patient.  Active Problems:   Cocaine abuse (HCC) Advised the patient to discontinue cocaine abuse given his medical history associated with it. The patient voiced understanding and will try to avoid cocaine use in the future.    Essential hypertension Avoid beta-blocker usage. Resume amlodipine 5 mg p.o. daily. Resume losartan 25 mg p.o. daily.    Hyperlipidemia Advised to follow-up with his PCP for long-term therapy.    DVT prophylaxis: Lovenox. Code Status: Full code. Family Communication: Disposition Plan: Observation for troponin level trending, serial EKGs and chest pain control. Consults called: Cardiology was consulted by the ED and will see the patient. Admission status: Observation/telemetry.   Bobette Mo MD [facility] Pager [phone].  If 7PM-7AM, please contact night-coverage [email or web address] Password [email or web address]  03/18/2018, 3:04 PM

## 2018-03-18 NOTE — Progress Notes (Signed)
CRITICAL VALUE ALERT  Critical Value:  Troponin 0.35 Date & Time Notied:  03/18/2018 1856  Provider Notified: Dr. Delton See on call for Cardiology   Orders Received/Actions taken: tex paged MD results

## 2018-03-19 DIAGNOSIS — I10 Essential (primary) hypertension: Secondary | ICD-10-CM

## 2018-03-19 DIAGNOSIS — E785 Hyperlipidemia, unspecified: Secondary | ICD-10-CM

## 2018-03-19 DIAGNOSIS — I214 Non-ST elevation (NSTEMI) myocardial infarction: Principal | ICD-10-CM

## 2018-03-19 DIAGNOSIS — F1410 Cocaine abuse, uncomplicated: Secondary | ICD-10-CM

## 2018-03-19 DIAGNOSIS — E7800 Pure hypercholesterolemia, unspecified: Secondary | ICD-10-CM

## 2018-03-19 LAB — BASIC METABOLIC PANEL
ANION GAP: 13 (ref 5–15)
BUN: 13 mg/dL (ref 6–20)
CALCIUM: 8.5 mg/dL — AB (ref 8.9–10.3)
CO2: 22 mmol/L (ref 22–32)
Chloride: 106 mmol/L (ref 98–111)
Creatinine, Ser: 1.13 mg/dL (ref 0.61–1.24)
GFR calc Af Amer: >60 mL/min (ref >60)
GFR calc non Af Amer: >60 mL/min (ref >60)
GLUCOSE: 106 mg/dL — AB (ref 70–99)
Potassium: 4.5 mmol/L (ref 3.5–5.1)
Sodium: 141 mmol/L (ref 135–145)

## 2018-03-19 LAB — HIV ANTIBODY (ROUTINE TESTING W REFLEX): HIV Screen 4th Generation wRfx: NONREACTIVE

## 2018-03-19 LAB — CBC
HCT: 50.6 % (ref 39.0–52.0)
Hemoglobin: 16.3 g/dL (ref 13.0–17.0)
MCH: 30.8 pg (ref 26.0–34.0)
MCHC: 32.2 g/dL (ref 30.0–36.0)
MCV: 95.5 fL (ref 78.0–100.0)
PLATELETS: 262 10*3/uL (ref 150–400)
RBC: 5.3 MIL/uL (ref 4.22–5.81)
RDW: 13.3 % (ref 11.5–15.5)
WBC: 7.1 10*3/uL (ref 4.0–10.5)

## 2018-03-19 LAB — HEPARIN LEVEL (UNFRACTIONATED)
Heparin Unfractionated: 0.34 IU/mL (ref 0.30–0.70)
Heparin Unfractionated: 0.3 IU/mL (ref 0.30–0.70)

## 2018-03-19 LAB — TROPONIN I
TROPONIN I: 1.82 ng/mL — AB (ref <0.03)
Troponin I: 1.91 ng/mL (ref <0.03)

## 2018-03-19 NOTE — Progress Notes (Signed)
PROGRESS NOTE   Bryce Ortega  GUR:[id number]    DOB: [birth year]    DOA: 03/18/2018  PCP: Lavinia Sharps, NP   I have briefly reviewed patients previous medical records in [facility].  Brief Narrative:  58 year old male with PMH of cocaine abuse, V. fib cardiac arrest after cocaine use, mitral insufficiency, nonobstructive CAD, MI due to cocaine use, HLD, HTN presented to the ED on 10/4 due to chest pain after snorting cocaine the night prior and associated with radiation to left shoulder, dyspnea and diaphoresis.  Nonacute EKG and troponin elevation.  Cardiology consulted.   Assessment & Plan:   Principal Problem:   Chest pain Active Problems:   Essential hypertension   Hyperlipidemia   NSTEMI (non-ST elevated myocardial infarction) (HCC)   Cocaine abuse (HCC)   NSTEMI due to vasospasm from cocaine use/nonobstructive CAD: EKG without acute changes.  Troponin elevation (0.35 > 1.82 > 1.91).  Cardiology consulted and directed care.  Avoiding beta-blockers given habitual cocaine use.  Had cardiac catheterization during similar presentation 3 months ago which showed widely patent arteries with minor luminal irregularities.  Continue anti-vasospasm medication such as isosorbide and amlodipine.  Cardiology indicates that given his elevated troponin peak of 1.9 and ongoing cocaine use, this portends a worsened prognosis overall.  Cardiology recommends another 24 hours of IV heparin infusion and discharge 10/6.  Continue aspirin.  Cocaine abuse: Cessation counseled.  Essential hypertension: Continue amlodipine, Imdur and losartan.  Controlled.  History of V. fib arrest: In the context of cocaine use.  Patient has been warned multiple times that if he continues to utilize cocaine, this may lead to death.  He verbalizes understanding.  Hyperlipidemia: Outpatient follow-up with PCP for long-term treatment.  Acute kidney injury: Resolved.   DVT prophylaxis: Lovenox Code Status:  Full Family Communication: None at bedside Disposition: As per cardiology recommendations, continue IV heparin infusion overnight and possible discharge in a.m.  Since patient presented with chest pain/NSTEMI in the context of cocaine abuse and likely to have recurrence CP, continue close monitoring on telemetry (prior history of V. fib arrest), IV heparin infusion.  Thereby will transition to inpatient status.   Consultants:  Cardiology  Procedures:  None  Antimicrobials:  None   Subjective: Denies chest pain.  States that he has not had any chest pain since last afternoon.  No dyspnea or any other complaints.  ROS: As above, otherwise negative.  Objective:  Vitals:   03/19/18 0044 03/19/18 0402 03/19/18 0832 03/19/18 1134  BP: 97/67 110/73 104/71 116/70  Pulse: 66 84 65 66  Resp: 18 18 18    Temp: 98.2 F (36.8 C) 97.8 F (36.6 C) 97.7 F (36.5 C) 98.2 F (36.8 C)  TempSrc: Axillary Oral Oral Oral  SpO2: 95% 97% 98% 97%  Weight:  73.3 kg    Height:        Examination:  General exam: Young male, moderately built and nourished lying comfortably supine in bed. Respiratory system: Clear to auscultation. Respiratory effort normal. Cardiovascular system: S1 & S2 heard, RRR. No JVD, murmurs, rubs, gallops or clicks. No pedal edema.  Telemetry personally reviewed: SB in the 50s-SR. Gastrointestinal system: Abdomen is nondistended, soft and nontender. No organomegaly or masses felt. Normal bowel sounds heard. Central nervous system: Alert and oriented. No focal neurological deficits. Extremities: Symmetric 5 x 5 power. Skin: No rashes, lesions or ulcers Psychiatry: Judgement and insight appear normal. Mood & affect appropriate.     Data Reviewed: I have personally  reviewed following labs and imaging studies  CBC: Recent Labs  Lab 03/18/18 0741 03/19/18 0733  WBC 6.8 7.1  HGB 16.3 16.3  HCT 49.6 50.6  MCV 95.6 95.5  PLT 276 262   Basic Metabolic Panel: Recent  Labs  Lab 03/18/18 0741 03/18/18 1723 03/19/18 0744  NA 138  --  141  K 4.5  --  4.5  CL 108  --  106  CO2 24  --  22  GLUCOSE 154*  --  106*  BUN 10  --  13  CREATININE 1.41*  --  1.13  CALCIUM 8.4*  --  8.5*  MG  --  2.7*  --   PHOS  --  2.8  --    Liver Function Tests: Recent Labs  Lab 03/18/18 0741  AST 28  ALT 24  ALKPHOS 55  BILITOT 0.8  PROT 5.3*  ALBUMIN 3.1*   Cardiac Enzymes: Recent Labs  Lab 03/18/18 1723 03/19/18 0003 03/19/18 0733  TROPONINI 0.35* 1.82* 1.91*     Recent Results (from the past 240 hour(s))  MRSA PCR Screening     Status: None   Collection Time: 03/18/18  4:49 PM  Result Value Ref Range Status   MRSA by PCR NEGATIVE NEGATIVE Final    Comment:        The GeneXpert MRSA Assay (FDA approved for NASAL specimens only), is one component of a comprehensive MRSA colonization surveillance program. It is not intended to diagnose MRSA infection nor to guide or monitor treatment for MRSA infections. Performed at [facility], [address] [place], [place] [address]          Radiology Studies: Dg Chest 2 View  Result Date: 03/18/2018 CLINICAL DATA:  Chest pain and shortness of breath EXAM: CHEST - 2 VIEW COMPARISON:  December 25, 2017 FINDINGS: The lungs are clear. Heart size and pulmonary vascularity are normal. No adenopathy. No evident bone lesions. No pneumothorax. IMPRESSION: No edema or consolidation. Electronically Signed   By: Bretta Bang III M.D.   On: 03/18/2018 08:17        Scheduled Meds: . amLODipine  5 mg Oral Daily  . aspirin EC  81 mg Oral Daily  . isosorbide mononitrate  30 mg Oral Daily  . losartan  25 mg Oral Daily   Continuous Infusions: . heparin 950 Units/hr (03/19/18 1048)     LOS: 0 days     Marcellus Scott, MD, FACP, [facility]. [facility] Pager [phone]  If 7PM-7AM, please contact night-coverage [email or web address] Password [email or web address] 03/19/2018, 2:39 PM

## 2018-03-19 NOTE — Progress Notes (Signed)
ANTICOAGULATION CONSULT NOTE - Follow-up Consult  Pharmacy Consult for heparin Indication: chest pain/ACS  Allergies  Allergen Reactions  . Propoxyphene N-Acetaminophen Nausea Only    REACTION: GI upset    Patient Measurements: Height: 5\' 10"  (177.8 cm) Weight: 165 lb (74.8 kg) IBW/kg (Calculated) : 73  Vital Signs: Temp: 98.2 F (36.8 C) (10/05 0044) Temp Source: Axillary (10/05 0044) BP: 97/67 (10/05 0044) Pulse Rate: 66 (10/05 0044)  Labs: Recent Labs    03/18/18 0741 03/18/18 1723 03/19/18 0003  HGB 16.3  --   --   HCT 49.6  --   --   PLT 276  --   --   HEPARINUNFRC  --   --  0.34  CREATININE 1.41*  --   --   TROPONINI  --  0.35*  --     Estimated Creatinine Clearance: 59.7 mL/min (A) (by C-G formula based on SCr of 1.41 mg/dL (H)).  Assessment: 57 y/o male active cocaine abuser who presented to the ED with chest pain. Of note, he had an NSTEMI in July thought to be due to cocaine induced coronary vasospasm; two previous caths showed mild nonobstructive CAD and normal LVEF. Troponin trending up, 0.02 > 0.2 > 0.35. Pt started on heparin drop. Enoxaparin 40 mg SQ given at 16:40 so no bolus given.  Heparin level therapeutic (0.34) on gtt at 900 units/hr. However this level was drawn only about 2 hours post heparin gtt start.   Goal of Therapy:  Heparin level 0.3-0.7 units/ml Monitor platelets by anticoagulation protocol: Yes   Plan:  - Continue heparin drip at 900 units/hr - Will f/u 6 hr heparin level  - Daily heparin level and CBC - Monitor for s/sx of bleeding  Christoper Fabian, PharmD, BCPS Clinical pharmacist  **Pharmacist phone directory can now be found on [email or web address] (PW [email or web address]).  Listed under [facility]. 03/19/2018 1:31 AM

## 2018-03-19 NOTE — Progress Notes (Signed)
ANTICOAGULATION CONSULT NOTE - Follow-up Consult  Pharmacy Consult for heparin Indication: chest pain/ACS  Allergies  Allergen Reactions  . Propoxyphene N-Acetaminophen Nausea Only    REACTION: GI upset    Patient Measurements: Height: 5\' 10"  (177.8 cm) Weight: 161 lb 8 oz (73.3 kg) IBW/kg (Calculated) : 73  Vital Signs: Temp: 97.7 F (36.5 C) (10/05 0832) Temp Source: Oral (10/05 0832) BP: 104/71 (10/05 0832) Pulse Rate: 65 (10/05 0832)  Labs: Recent Labs    03/18/18 0741 03/18/18 1723 03/19/18 0003 03/19/18 0733 03/19/18 0744  HGB 16.3  --   --  16.3  --   HCT 49.6  --   --  50.6  --   PLT 276  --   --  262  --   HEPARINUNFRC  --   --  0.34 0.30  --   CREATININE 1.41*  --   --   --  1.13  TROPONINI  --  0.35* 1.82* 1.91*  --     Estimated Creatinine Clearance: 74.5 mL/min (by C-G formula based on SCr of 1.13 mg/dL).  Assessment: 57 y/o male active cocaine abuser who presented to the ED with chest pain. Of note, he had an NSTEMI in July thought to be due to cocaine induced coronary vasospasm; two previous caths showed mild nonobstructive CAD and normal LVEF. Troponin trending up, 0.02 > 0.2 > 0.35. Pt started on heparin drop. Enoxaparin 40 mg SQ given at 16:40 so no bolus given.  Heparin level therapeutic at 0.30, CBC stable. Will increase drip rate slightly.  Goal of Therapy:  Heparin level 0.3-0.7 units/ml Monitor platelets by anticoagulation protocol: Yes   Plan:  -Increase heparin to 950 units/hr -Recheck heparin level and CBC with am labs -Follow-up plans for cath  Fredonia Highland, PharmD, BCPS Clinical Pharmacist [phone] Please check AMION for all [facility] Pharm[facility] numbers 03/19/2018

## 2018-03-19 NOTE — Progress Notes (Signed)
Progress Note  Patient Name: Bryce Ortega Date of Encounter: 03/19/2018  Primary Cardiologist: Lesleigh Noe, MD   Subjective   Overall chest pain-free.  No shortness of breath.  Inpatient Medications    Scheduled Meds: . amLODipine  5 mg Oral Daily  . aspirin EC  81 mg Oral Daily  . isosorbide mononitrate  30 mg Oral Daily  . losartan  25 mg Oral Daily   Continuous Infusions: . heparin 950 Units/hr (03/19/18 1048)   PRN Meds: LORazepam, ondansetron **OR** ondansetron (ZOFRAN) IV   Vital Signs    Vitals:   03/18/18 2033 03/19/18 0044 03/19/18 0402 03/19/18 0832  BP: 126/80 97/67 110/73 104/71  Pulse: 74 66 84 65  Resp:  18 18 18   Temp: 98.1 F (36.7 C) 98.2 F (36.8 C) 97.8 F (36.6 C) 97.7 F (36.5 C)  TempSrc: Oral Axillary Oral Oral  SpO2: 97% 95% 97% 98%  Weight:   73.3 kg   Height:        Intake/Output Summary (Last 24 hours) at 03/19/2018 1108 Last data filed at 03/19/2018 0300 Gross per 24 hour  Intake 730.98 ml  Output 1125 ml  Net -394.02 ml   Filed Weights   03/18/18 0732 03/19/18 0402  Weight: 74.8 kg 73.3 kg    Telemetry    No adverse arrhythmias.  Sinus bradycardia no other adverse arrhythmias.- Personally Reviewed  ECG    03/18/2018-7:29 AM- sinus rhythm with minimal J-point elevation noted inferiorly.  No significant change from prior ECG- Personally Reviewed  Physical Exam   GEN: No acute distress.   Neck: No JVD Cardiac: RRR, no murmurs, rubs, or gallops.  Respiratory: Clear to auscultation bilaterally. GI: Soft, nontender, non-distended  MS: No edema; No deformity. Neuro:  Nonfocal  Psych: Normal affect   Labs    Chemistry Recent Labs  Lab 03/18/18 0741 03/19/18 0744  NA 138 141  K 4.5 4.5  CL 108 106  CO2 24 22  GLUCOSE 154* 106*  BUN 10 13  CREATININE 1.41* 1.13  CALCIUM 8.4* 8.5*  PROT 5.3*  --   ALBUMIN 3.1*  --   AST 28  --   ALT 24  --   ALKPHOS 55  --   BILITOT 0.8  --   GFRNONAA 54* >60    GFRAA >60 >60  ANIONGAP 6 13     Hematology Recent Labs  Lab 03/18/18 0741 03/19/18 0733  WBC 6.8 7.1  RBC 5.19 5.30  HGB 16.3 16.3  HCT 49.6 50.6  MCV 95.6 95.5  MCH 31.4 30.8  MCHC 32.9 32.2  RDW 13.4 13.3  PLT 276 262    Cardiac Enzymes Recent Labs  Lab 03/18/18 1723 03/19/18 0003 03/19/18 0733  TROPONINI 0.35* 1.82* 1.91*    Recent Labs  Lab 03/18/18 0748 03/18/18 1153  TROPIPOC 0.02 0.20*     BNPNo results for input(s): BNP, PROBNP in the last 168 hours.   DDimer No results for input(s): DDIMER in the last 168 hours.   Radiology    Dg Chest 2 View  Result Date: 03/18/2018 CLINICAL DATA:  Chest pain and shortness of breath EXAM: CHEST - 2 VIEW COMPARISON:  December 25, 2017 FINDINGS: The lungs are clear. Heart size and pulmonary vascularity are normal. No adenopathy. No evident bone lesions. No pneumothorax. IMPRESSION: No edema or consolidation. Electronically Signed   By: Bretta Bang III M.D.   On: 03/18/2018 08:17    Cardiac Studies   Echocardiogram  12/27/2017: - Normal LV function; mild diastolic dysfunction; trace AI.  Cardiac catheterization 12/27/2017:  Widely patent coronary arteries.  Coronary anatomy.  Left main is normal.  The LAD contains 30 to 40% mid eccentric narrowing after the second diagonal.  The LAD does not wraparound the apex.  The distal segment demonstrates some evidence of systolic compression.  The circumflex is widely patent.  The second and third obtuse marginals contained 40% ostial narrowing.  Dominant vessel with eccentric 30% mid vessel narrowing.  Non-ST elevation myocardial infarction affecting the anterolateral wall, possibly related to coronary vasospasm in the setting of cocaine use versus atypical Takotsubo stress cardiomyopathy.  RECOMMENDATIONS:   Risk factor modification: Cocaine, blood pressure, glycemic, lipid, and tobacco (if any) control.    Frankly discussed the hazards of continued cocaine use  given prior VF arrest and non-ST elevation MI on this occasion with wall motion abnormality.  Discontinue IV nitroglycerin.  Consider dual antiplatelet therapy for 6-12 months    Patient Profile     57 y.o. male with coronary vasospasm in the setting of cocaine use resulting in non-ST elevation myocardial infarction  Assessment & Plan    Non-ST elevation myocardial infarction secondary to vasospasm and cocaine use -Avoid beta-blocker given habitual cocaine use. -Recent cardiac catheterization less than 3 months ago reviewed.  Widely patent arteries.  Minor luminal irregularities. -Agree with anti-vasospasm medication such as isosorbide, amlodipine. - Of course, continue to encourage cocaine cessation.  Given his elevated troponin peak 1.9 and ongoing cocaine use, this does portend a worsened prognosis overall. -He was wondering if he can go home today.  I would advocate for another 24 hours of heparin IV and then discharge tomorrow.  Prior history of VF arrest - Has had multiple frank discussions about ongoing cocaine use.  Ultimately if he continues to utilize cocaine, this may lead to his death.  He understands and takes full responsibility.   No further cardiac testing at this time.  [facility] will sign off.   Medication Recommendations: Continue with isosorbide, amlodipine, aspirin Other recommendations (labs, testing, etc): None Follow up as an outpatient: Continue follow-up with his primary physician.  For questions or updates, please contact [facility] Please consult [email or web address] for contact info under      Signed, Donato Schultz, MD  03/19/2018, 11:08 AM

## 2018-03-20 LAB — CBC
HEMATOCRIT: 50.4 % (ref 39.0–52.0)
HEMOGLOBIN: 16.3 g/dL (ref 13.0–17.0)
MCH: 31 pg (ref 26.0–34.0)
MCHC: 32.3 g/dL (ref 30.0–36.0)
MCV: 96 fL (ref 78.0–100.0)
Platelets: 280 10*3/uL (ref 150–400)
RBC: 5.25 MIL/uL (ref 4.22–5.81)
RDW: 13.2 % (ref 11.5–15.5)
WBC: 6.6 10*3/uL (ref 4.0–10.5)

## 2018-03-20 LAB — HEPARIN LEVEL (UNFRACTIONATED): HEPARIN UNFRACTIONATED: 0.16 [IU]/mL — AB (ref 0.30–0.70)

## 2018-03-20 MED ORDER — NITROGLYCERIN 0.4 MG SL SUBL: 0.4000 mg | SUBLINGUAL_TABLET | SUBLINGUAL | 0 refills | Status: DC | PRN

## 2018-03-20 MED ORDER — ISOSORBIDE MONONITRATE ER 30 MG PO TB24: 30.0000 mg | ORAL_TABLET | Freq: Every day | ORAL | 0 refills | Status: DC

## 2018-03-20 MED ORDER — AMLODIPINE BESYLATE 5 MG PO TABS: 5.0000 mg | ORAL_TABLET | Freq: Every day | ORAL | 0 refills | Status: DC

## 2018-03-20 MED ORDER — ASPIRIN EC 81 MG PO TBEC: 81.0000 mg | DELAYED_RELEASE_TABLET | Freq: Every day | ORAL | 0 refills | Status: AC

## 2018-03-20 MED ORDER — LOSARTAN POTASSIUM 25 MG PO TABS: 25.0000 mg | ORAL_TABLET | Freq: Every day | ORAL | 0 refills | Status: DC

## 2018-03-20 NOTE — Discharge Instructions (Addendum)

## 2018-03-20 NOTE — Discharge Summary (Signed)
Physician Discharge Summary  Bryce Ortega NIO:[id number] DOB: 1960/06/30  PCP: Bryce Sharps, NP  Admit date: 03/18/2018 Discharge date: 03/20/2018  Recommendations for Outpatient Follow-up:  1. Bryce Abrahams Placey, NP/PCP in 1 week with repeat labs (CBC & BMP). 2. Dr. Verdis Ortega, Cardiology  Home Health: None Equipment/Devices: None  Discharge Condition: Improved and stable CODE STATUS: Full Diet recommendation: Heart healthy diet.  Discharge Diagnoses:  Principal Problem:   Chest pain Active Problems:   Essential hypertension   Hyperlipidemia   NSTEMI (non-ST elevated myocardial infarction) (HCC)   Cocaine abuse (HCC)   Brief Summary: 57 year old male with PMH of cocaine abuse, V. fib cardiac arrest after cocaine use, mitral insufficiency, nonobstructive CAD, MI due to cocaine use, HLD, HTN presented to the ED on 10/4 due to chest pain after snorting cocaine the night prior and associated with radiation to left shoulder, dyspnea and diaphoresis.  Nonacute EKG and troponin elevation.  Cardiology consulted.   Assessment & Plan:   NSTEMI due to vasospasm from cocaine use/nonobstructive CAD: EKG without acute changes.  Troponin elevation (0.35 > 1.82 > 1.91).  Cardiology consulted and directed care.  Avoiding beta-blockers given habitual cocaine use.  Had cardiac catheterization during similar presentation 3 months ago which showed widely patent arteries with minor luminal irregularities.  Continue anti-vasospasm medication such as isosorbide and amlodipine.  Cardiology indicates that given his elevated troponin peak of 1.9 and ongoing cocaine use, this portends a worsened prognosis overall. Continue aspirin.  As per cardiology recommendations yesterday, he was continued on IV heparin drip for additional 24 hours without recurrence of chest pain.  Stable for discharge.  Cocaine cessation has been extensively counseled, repeatedly and by multiple providers.  He verbalizes  understanding.  Cocaine abuse: Cessation counseled.  Essential hypertension: Continue amlodipine, Imdur and losartan.  Controlled.  History of V. fib arrest: In the context of cocaine use.  Patient has been warned multiple times that if he continues to utilize cocaine, this may lead to death.  He verbalizes understanding.  Hyperlipidemia: Outpatient follow-up with PCP for long-term treatment.  Suspect noncompliance to statins in the past.  Acute kidney injury: Resolved.   Consultants:  Cardiology  Procedures:  None   Discharge Instructions  Discharge Instructions    Call MD for:  difficulty breathing, headache or visual disturbances   Complete by:  As directed    Call MD for:  extreme fatigue   Complete by:  As directed    Call MD for:  persistant dizziness or light-headedness   Complete by:  As directed    Call MD for:  severe uncontrolled pain   Complete by:  As directed    Diet - low sodium heart healthy   Complete by:  As directed    Increase activity slowly   Complete by:  As directed        Medication List    STOP taking these medications   atorvastatin 80 MG tablet Commonly known as:  LIPITOR   clopidogrel 75 MG tablet Commonly known as:  PLAVIX   lidocaine 5 % Commonly known as:  LIDODERM     TAKE these medications   amLODipine 5 MG tablet Commonly known as:  NORVASC Take 1 tablet (5 mg total) by mouth daily.   aspirin EC 81 MG tablet Take 1 tablet (81 mg total) by mouth daily.   isosorbide mononitrate 30 MG 24 hr tablet Commonly known as:  IMDUR Take 1 tablet (30 mg total) by mouth daily.  losartan 25 MG tablet Commonly known as:  COZAAR Take 1 tablet (25 mg total) by mouth daily.   nitroGLYCERIN 0.4 MG SL tablet Commonly known as:  NITROSTAT Place 1 tablet (0.4 mg total) under the tongue every 5 (five) minutes as needed for chest pain (for 3 doses).      Follow-up Information    Ortega, Bryce Abrahams, NP. Schedule an appointment  as soon as possible for a visit in 1 week(s).   Why:  To be seen with repeat labs (CBC & BMP). Contact information: [address] [place] [address] [phone]        Bryce Records, MD. Schedule an appointment as soon as possible for a visit.   Specialty:  Cardiology Contact information: [address] [place] [address] [phone]          Allergies  Allergen Reactions  . Propoxyphene N-Acetaminophen Nausea Only    REACTION: GI upset      Procedures/Studies: Dg Chest 2 View  Result Date: 03/18/2018 CLINICAL DATA:  Chest pain and shortness of breath EXAM: CHEST - 2 VIEW COMPARISON:  December 25, 2017 FINDINGS: The lungs are clear. Heart size and pulmonary vascularity are normal. No adenopathy. No evident bone lesions. No pneumothorax. IMPRESSION: No edema or consolidation. Electronically Signed   By: Bretta Bang III M.D.   On: 03/18/2018 08:17      Subjective: Patient denies complaints.  Wants to know when he is going home.  No recurrence of chest pain since 10/4.  As per RN, no acute issues noted.  Discharge Exam:  Vitals:   03/19/18 1134 03/19/18 1626 03/19/18 1941 03/20/18 0512  BP: 116/70 118/85 115/80 (!) 129/91  Pulse: 66 60 61 62  Resp:   16 17  Temp: 98.2 F (36.8 C) 97.9 F (36.6 C) 98.2 F (36.8 C) 97.6 F (36.4 C)  TempSrc: Oral Oral Oral Oral  SpO2: 97% 96% 95% 96%  Weight:    73.3 kg  Height:        General exam: Young male, moderately built and nourished lying comfortably supine in bed. Respiratory system: Clear to auscultation. Respiratory effort normal. Cardiovascular system: S1 & S2 heard, RRR. No JVD, murmurs, rubs, gallops or clicks. No pedal edema.    Telemetry personally reviewed: SB in the 50s-SR. Gastrointestinal system: Abdomen is nondistended, soft and nontender. No organomegaly or masses felt. Normal bowel sounds heard. Central nervous system: Alert and oriented. No focal neurological  deficits. Extremities: Symmetric 5 x 5 power. Skin: No rashes, lesions or ulcers Psychiatry: Judgement and insight appear normal. Mood & affect appropriate.     The results of significant diagnostics from this hospitalization (including imaging, microbiology, ancillary and laboratory) are listed below for reference.     Microbiology: Recent Results (from the past 240 hour(s))  MRSA PCR Screening     Status: None   Collection Time: 03/18/18  4:49 PM  Result Value Ref Range Status   MRSA by PCR NEGATIVE NEGATIVE Final    Comment:        The GeneXpert MRSA Assay (FDA approved for NASAL specimens only), is one component of a comprehensive MRSA colonization surveillance program. It is not intended to diagnose MRSA infection nor to guide or monitor treatment for MRSA infections. Performed at [facility], [address], [place], [place] [address]      Labs: CBC: Recent Labs  Lab 03/18/18 0741 03/19/18 0733 03/20/18 0651  WBC 6.8 7.1 6.6  HGB 16.3 16.3 16.3  HCT 49.6 50.6 50.4  MCV 95.6 95.5 96.0  PLT 276 262 280   Basic Metabolic Panel: Recent Labs  Lab 03/18/18 0741 03/18/18 1723 03/19/18 0744  NA 138  --  141  K 4.5  --  4.5  CL 108  --  106  CO2 24  --  22  GLUCOSE 154*  --  106*  BUN 10  --  13  CREATININE 1.41*  --  1.13  CALCIUM 8.4*  --  8.5*  MG  --  2.7*  --   PHOS  --  2.8  --    Liver Function Tests: Recent Labs  Lab 03/18/18 0741  AST 28  ALT 24  ALKPHOS 55  BILITOT 0.8  PROT 5.3*  ALBUMIN 3.1*   Cardiac Enzymes: Recent Labs  Lab 03/18/18 1723 03/19/18 0003 03/19/18 0733  TROPONINI 0.35* 1.82* 1.91*      Time coordinating discharge: 25 minutes  SIGNED:  Marcellus Scott, MD, FACP, [facility]. [facility] Pager [phone]  If 7PM-7AM, please contact night-coverage [email or web address] Password [email or web address] 03/20/2018, 10:43 AM

## 2018-03-20 NOTE — Progress Notes (Signed)
ANTICOAGULATION CONSULT NOTE - Follow-up Consult  Pharmacy Consult for heparin Indication: chest pain/ACS  Allergies  Allergen Reactions  . Propoxyphene N-Acetaminophen Nausea Only    REACTION: GI upset    Patient Measurements: Height: 5\' 10"  (177.8 cm) Weight: 161 lb 9.6 oz (73.3 kg) IBW/kg (Calculated) : 73  Vital Signs: Temp: 97.6 F (36.4 C) (10/06 0512) Temp Source: Oral (10/06 0512) BP: 129/91 (10/06 0512) Pulse Rate: 62 (10/06 0512)  Labs: Recent Labs    03/18/18 0741 03/18/18 1723 03/19/18 0003 03/19/18 0733 03/19/18 0744 03/20/18 0651  HGB 16.3  --   --  16.3  --  16.3  HCT 49.6  --   --  50.6  --  50.4  PLT 276  --   --  262  --  280  HEPARINUNFRC  --   --  0.34 0.30  --  0.16*  CREATININE 1.41*  --   --   --  1.13  --   TROPONINI  --  0.35* 1.82* 1.91*  --   --     Estimated Creatinine Clearance: 74.5 mL/min (by C-G formula based on SCr of 1.13 mg/dL).  Assessment: 57 y/o male active cocaine abuser who presented to the ED with chest pain. Of note, he had an NSTEMI in July thought to be due to cocaine induced coronary vasospasm; two previous caths showed mild nonobstructive CAD and normal LVEF. Troponin trending up, 0.02 > 0.2 > 0.35. Pt started on heparin drop. Enoxaparin 40 mg SQ given at 16:40 so no bolus given.  Heparin level subtherapeutic but pt to be discharged in an hour per RN. Will make no adjustments at this time.  Goal of Therapy:  Heparin level 0.3-0.7 units/ml Monitor platelets by anticoagulation protocol: Yes   Plan:  -Continue heparin 950/h until discharge  Fredonia Highland, PharmD, BCPS Clinical Pharmacist [phone] Please check AMION for all [facility] Pharmacy numbers 03/20/2018

## 2018-03-20 NOTE — Progress Notes (Signed)
Spoke w patient, he states he has an apt at [facility] 10/9. He has no new medications at DC. His roommate will be providing transportation home. No other CM needs identified.
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
## 2019-09-26 ENCOUNTER — Encounter (HOSPITAL_COMMUNITY): Payer: Self-pay | Admitting: Obstetrics and Gynecology

## 2019-09-26 ENCOUNTER — Emergency Department (HOSPITAL_COMMUNITY)
Admission: EM | Admit: 2019-09-26 | Discharge: 2019-09-26 | Disposition: A | Discharge status: Home / Self Care | Payer: Self-pay | Attending: Emergency Medicine | Admitting: Emergency Medicine

## 2019-09-26 DIAGNOSIS — Z79899 Other long term (current) drug therapy: Secondary | ICD-10-CM | POA: Insufficient documentation

## 2019-09-26 DIAGNOSIS — I10 Essential (primary) hypertension: Secondary | ICD-10-CM | POA: Insufficient documentation

## 2019-09-26 DIAGNOSIS — T50901A Poisoning by unspecified drugs, medicaments and biological substances, accidental (unintentional), initial encounter: Secondary | ICD-10-CM | POA: Insufficient documentation

## 2019-09-26 DIAGNOSIS — Z7982 Long term (current) use of aspirin: Secondary | ICD-10-CM | POA: Insufficient documentation

## 2019-09-26 LAB — COMPREHENSIVE METABOLIC PANEL
ALT: 72 U/L — ABNORMAL HIGH (ref 0–44)
AST: 76 U/L — ABNORMAL HIGH (ref 15–41)
Albumin: 3.6 g/dL (ref 3.5–5.0)
Alkaline Phosphatase: 66 U/L (ref 38–126)
Anion gap: 16 — ABNORMAL HIGH (ref 5–15)
BUN: 6 mg/dL (ref 6–20)
CO2: 18 mmol/L — ABNORMAL LOW (ref 22–32)
Calcium: 8.2 mg/dL — ABNORMAL LOW (ref 8.9–10.3)
Chloride: 104 mmol/L (ref 98–111)
Creatinine, Ser: 0.98 mg/dL (ref 0.61–1.24)
GFR calc Af Amer: >60 mL/min (ref >60)
GFR calc non Af Amer: >60 mL/min (ref >60)
Glucose, Bld: 159 mg/dL — ABNORMAL HIGH (ref 70–99)
Potassium: 3.7 mmol/L (ref 3.5–5.1)
Sodium: 138 mmol/L (ref 135–145)
Total Bilirubin: 0.6 mg/dL (ref 0.3–1.2)
Total Protein: 6.7 g/dL (ref 6.5–8.1)

## 2019-09-26 LAB — CBC WITH DIFFERENTIAL/PLATELET
Abs Immature Granulocytes: 0.04 10*3/uL (ref 0.00–0.07)
Basophils Absolute: 0.1 10*3/uL (ref 0.0–0.1)
Basophils Relative: 1 %
Eosinophils Absolute: 0.1 10*3/uL (ref 0.0–0.5)
Eosinophils Relative: 2 %
HCT: 48.7 % (ref 39.0–52.0)
Hemoglobin: 15.7 g/dL (ref 13.0–17.0)
Immature Granulocytes: 1 %
Lymphocytes Relative: 18 %
Lymphs Abs: 1.2 10*3/uL (ref 0.7–4.0)
MCH: 31 pg (ref 26.0–34.0)
MCHC: 32.2 g/dL (ref 30.0–36.0)
MCV: 96.1 fL (ref 80.0–100.0)
Monocytes Absolute: 0.5 10*3/uL (ref 0.1–1.0)
Monocytes Relative: 8 %
Neutro Abs: 4.6 10*3/uL (ref 1.7–7.7)
Neutrophils Relative %: 70 %
Platelets: 292 10*3/uL (ref 150–400)
RBC: 5.07 MIL/uL (ref 4.22–5.81)
RDW: 12.9 % (ref 11.5–15.5)
WBC: 6.6 10*3/uL (ref 4.0–10.5)
nRBC: 0 % (ref 0.0–0.2)

## 2019-09-26 LAB — TROPONIN I (HIGH SENSITIVITY)
Troponin I (High Sensitivity): 3 ng/L (ref <18)
Troponin I (High Sensitivity): 9 ng/L (ref <18)

## 2019-09-26 LAB — URINALYSIS, ROUTINE W REFLEX MICROSCOPIC
Bacteria, UA: NONE SEEN
Bilirubin Urine: NEGATIVE
Glucose, UA: 50 mg/dL — AB
Ketones, ur: NEGATIVE mg/dL
Leukocytes,Ua: NEGATIVE
Nitrite: NEGATIVE
Protein, ur: 30 mg/dL — AB
Specific Gravity, Urine: 1.011 (ref 1.005–1.030)
pH: 5 (ref 5.0–8.0)

## 2019-09-26 LAB — RAPID URINE DRUG SCREEN, HOSP PERFORMED
Amphetamines: NOT DETECTED
Barbiturates: NOT DETECTED
Benzodiazepines: NOT DETECTED
Cocaine: POSITIVE — AB
Opiates: NOT DETECTED
Tetrahydrocannabinol: NOT DETECTED

## 2019-09-26 LAB — ETHANOL: Alcohol, Ethyl (B): 67 mg/dL — ABNORMAL HIGH (ref <10)

## 2019-09-26 LAB — LACTIC ACID, PLASMA
Lactic Acid, Venous: 2.4 mmol/L (ref 0.5–1.9)
Lactic Acid, Venous: 1.9 mmol/L (ref 0.5–1.9)

## 2019-09-26 MED ORDER — SODIUM CHLORIDE 0.9 % IV BOLUS
1000.0000 mL | Freq: Once | INTRAVENOUS | Status: AC
  Administered 2019-09-26: 1000 mL via INTRAVENOUS

## 2019-09-26 MED ORDER — ONDANSETRON HCL 4 MG/2ML IJ SOLN
4.0000 mg | Freq: Once | INTRAMUSCULAR | Status: AC
  Administered 2019-09-26: 4 mg via INTRAVENOUS
  Filled 2019-09-26: qty 2
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
## 2019-09-26 NOTE — ED Notes (Signed)
Date and time results received: 09/26/19 6:35 PM  (use smartphrase ".now" to insert current time)  Test: Lactic Critical Value: 2.4  Name of Provider Notified: Delo  Orders Received? Or Actions Taken?: Orders Received - See Orders for details

## 2019-09-26 NOTE — Discharge Instructions (Signed)
Refrain from further illicit drug use.  Return to the ER for any new and/or concerning symptoms.

## 2019-09-26 NOTE — ED Provider Notes (Signed)
[facility]-EMERGENCY DEPT Provider Note   CSN: [id number] Arrival date & time: 09/26/19  1600     History No chief complaint on file.   Bryce Ortega is a 59 y.o. male.  Patient is a 59 year old male with past medical history of polysubstance abuse, hypertension, cocaine induced MI, hyperlipidemia.  He presents today for evaluation of altered mental status.  According to what I am told, patient was found by his friends unresponsive at his house.  He was apparently smoking what he believed was crack cocaine, however was actually heroin.  I am told that he was "blue" when first responders arrived.  The fire department applied bag-valve-mask and performed respirations until EMS arrived.  By that time, the patient was awake and speaking.  Patient reports nausea, but has no other symptoms.  He does report drinking alcohol as well today.  The history is provided by the patient.       Past Medical History:  Diagnosis Date  . Cocaine abuse (HCC)   . Complication of anesthesia   . H/O ventricular fibrillation 2016   s/p cocaine use  . High cholesterol   . Hypertension   . MI (mitral incompetence)   . Myocardial infarction (HCC) 2016; ?06/2017   "both related to cocaine" (03/18/2018)  . Nonobstructive atherosclerosis of coronary artery 2016   LHC showed normal EF and mild non-obstructive CAD    Patient Active Problem List   Diagnosis Date Noted  . AKI (acute kidney injury) (HCC) 07/07/2017  . Ischemia, myocardial, acute (HCC) 07/06/2017  . Elevated troponin   . Cocaine adverse reaction 02/23/2017  . Chest pain 02/22/2017  . Mild CAD 02/22/2017  . NSTEMI (non-ST elevated myocardial infarction) (HCC) 05/15/2015  . Cocaine abuse (HCC) 05/15/2015  . Chest pain at rest   . Cardiac arrest (HCC) 05/10/2015  . Hyperlipidemia 05/10/2015  . Essential hypertension 01/10/2009  . SHOULDER PAIN, RIGHT 01/10/2009  . CERVICALGIA 01/10/2009  . LOW BACK PAIN 01/10/2009      Past Surgical History:  Procedure Laterality Date  . CARDIAC CATHETERIZATION N/A 05/10/2015   Procedure: Left Heart Cath and Coronary Angiography;  Surgeon: Dolores Patty, MD;  Location: [facility] INVASIVE CV LAB;  Service: Cardiovascular;  Laterality: N/A;  . LEFT HEART CATH AND CORONARY ANGIOGRAPHY N/A 12/27/2017   Procedure: LEFT HEART CATH AND CORONARY ANGIOGRAPHY;  Surgeon: Lyn Records, MD;  Location: [facility] INVASIVE CV LAB;  Service: Cardiovascular;  Laterality: N/A;  . TONSILLECTOMY         Family History  Problem Relation Age of Onset  . Hypertension Father   . CAD Father        s/p CABG  . Heart disease Mother   . Heart disease Maternal Grandfather     Social History   Tobacco Use  . Smoking status: Never Smoker  . Smokeless tobacco: Never Used  Substance Use Topics  . Alcohol use: Yes    Alcohol/week: 13.0 standard drinks    Types: 13 Cans of beer per week    Comment: 10/4//2019 "~ 2 40 oz beers 2x/week"  . Drug use: Yes    Types: Cocaine    Comment: 10/4//2019 ""maybe once/month"    Home Medications Prior to Admission medications   Medication Sig Start Date End Date Taking? Authorizing Provider  amLODipine (NORVASC) 5 MG tablet Take 1 tablet (5 mg total) by mouth daily. 03/20/18   Hongalgi, Maximino Greenland, MD  aspirin EC 81 MG tablet Take 1 tablet (81  mg total) by mouth daily. 03/20/18   Hongalgi, Maximino Greenland, MD  isosorbide mononitrate (IMDUR) 30 MG 24 hr tablet Take 1 tablet (30 mg total) by mouth daily. 03/20/18   Hongalgi, Maximino Greenland, MD  losartan (COZAAR) 25 MG tablet Take 1 tablet (25 mg total) by mouth daily. 03/20/18   Hongalgi, Maximino Greenland, MD  nitroGLYCERIN (NITROSTAT) 0.4 MG SL tablet Place 1 tablet (0.4 mg total) under the tongue every 5 (five) minutes as needed for chest pain (for 3 doses). 03/20/18   Hongalgi, Maximino Greenland, MD    Allergies    Propoxyphene n-acetaminophen  Review of Systems   Review of Systems  All other systems reviewed and are negative.   Physical  Exam Updated Vital Signs There were no vitals taken for this visit.  Physical Exam Vitals and nursing note reviewed.  Constitutional:      General: He is not in acute distress.    Appearance: He is well-developed. He is diaphoretic.     Comments: Patient is a 59 year old male who appears uncomfortable.  He is actively retching and vomiting.  He is somewhat diaphoretic.  HENT:     Head: Normocephalic and atraumatic.  Eyes:     Comments: Pupils are pinpoint, but reactive.  Cardiovascular:     Rate and Rhythm: Normal rate and regular rhythm.     Heart sounds: No murmur. No friction rub.  Pulmonary:     Effort: Pulmonary effort is normal. No respiratory distress.     Breath sounds: Normal breath sounds. No wheezing or rales.  Abdominal:     General: Bowel sounds are normal. There is no distension.     Palpations: Abdomen is soft.     Tenderness: There is no abdominal tenderness.  Musculoskeletal:        General: Normal range of motion.     Cervical back: Normal range of motion and neck supple.  Skin:    General: Skin is warm.  Neurological:     Mental Status: He is alert and oriented to person, place, and time.     Cranial Nerves: No cranial nerve deficit.     Sensory: No sensory deficit.     Coordination: Coordination normal.     ED Results / Procedures / Treatments   Labs (all labs ordered are listed, but only abnormal results are displayed) Labs Reviewed  COMPREHENSIVE METABOLIC PANEL  CBC WITH DIFFERENTIAL/PLATELET  URINALYSIS, ROUTINE W REFLEX MICROSCOPIC  RAPID URINE DRUG SCREEN, HOSP PERFORMED  LACTIC ACID, PLASMA  LACTIC ACID, PLASMA  ETHANOL  TROPONIN I (HIGH SENSITIVITY)    EKG None  Radiology No results found.  Procedures Procedures (including critical care time)  Medications Ordered in ED Medications  ondansetron (ZOFRAN) injection 4 mg (has no administration in time range)  sodium chloride 0.9 % bolus 1,000 mL (has no administration in time  range)    ED Course  I have reviewed the triage vital signs and the nursing notes.  Pertinent labs & imaging results that were available during my care of the patient were reviewed by me and considered in my medical decision making (see chart for details).    MDM Rules/Calculators/A&P  Patient is a 59 year old male with history of polysubstance abuse presenting after being found unresponsive and blue by his acquaintances.  He was resuscitated with bag-valve-mask by the fire department and has been awake and protecting his airway since.  Laboratory studies show an EtOH level of 67 and tox screen positive for cocaine.  Patient  observed for a prolonged period of time and at this point I feel can be safely discharged.  This appears to be an accidental overdose.  Final Clinical Impression(s) / ED Diagnoses Final diagnoses:  None    Rx / DC Orders ED Discharge Orders    None       Veryl Speak, MD 09/26/19 2202

## 2020-06-16 ENCOUNTER — Emergency Department (HOSPITAL_COMMUNITY)
Admission: EM | Admit: 2020-06-16 | Discharge: 2020-06-16 | Disposition: A | Discharge status: Home / Self Care | Payer: Self-pay | Attending: Emergency Medicine | Admitting: Emergency Medicine

## 2020-06-16 ENCOUNTER — Other Ambulatory Visit: Payer: Self-pay

## 2020-06-16 ENCOUNTER — Encounter (HOSPITAL_COMMUNITY): Payer: Self-pay | Admitting: Emergency Medicine

## 2020-06-16 ENCOUNTER — Emergency Department (HOSPITAL_COMMUNITY): Payer: Self-pay

## 2020-06-16 DIAGNOSIS — Z5321 Procedure and treatment not carried out due to patient leaving prior to being seen by health care provider: Secondary | ICD-10-CM | POA: Insufficient documentation

## 2020-06-16 DIAGNOSIS — Z7982 Long term (current) use of aspirin: Secondary | ICD-10-CM | POA: Insufficient documentation

## 2020-06-16 DIAGNOSIS — R079 Chest pain, unspecified: Secondary | ICD-10-CM | POA: Insufficient documentation

## 2020-06-16 DIAGNOSIS — I10 Essential (primary) hypertension: Secondary | ICD-10-CM | POA: Insufficient documentation

## 2020-06-16 DIAGNOSIS — Z79899 Other long term (current) drug therapy: Secondary | ICD-10-CM | POA: Insufficient documentation

## 2020-06-16 LAB — CBC
HCT: 50.5 % (ref 39.0–52.0)
Hemoglobin: 16.7 g/dL (ref 13.0–17.0)
MCH: 31.2 pg (ref 26.0–34.0)
MCHC: 33.1 g/dL (ref 30.0–36.0)
MCV: 94.2 fL (ref 80.0–100.0)
Platelets: 283 10*3/uL (ref 150–400)
RBC: 5.36 MIL/uL (ref 4.22–5.81)
RDW: 13.1 % (ref 11.5–15.5)
WBC: 7 10*3/uL (ref 4.0–10.5)
nRBC: 0 % (ref 0.0–0.2)

## 2020-06-16 LAB — BASIC METABOLIC PANEL
Anion gap: 8 (ref 5–15)
BUN: 11 mg/dL (ref 6–20)
CO2: 24 mmol/L (ref 22–32)
Calcium: 8.8 mg/dL — ABNORMAL LOW (ref 8.9–10.3)
Chloride: 105 mmol/L (ref 98–111)
Creatinine, Ser: 1.04 mg/dL (ref 0.61–1.24)
GFR, Estimated: >60 mL/min (ref >60)
Glucose, Bld: 101 mg/dL — ABNORMAL HIGH (ref 70–99)
Potassium: 3.9 mmol/L (ref 3.5–5.1)
Sodium: 137 mmol/L (ref 135–145)

## 2020-06-16 LAB — TROPONIN I (HIGH SENSITIVITY): Troponin I (High Sensitivity): 13 ng/L (ref <18)

## 2020-06-16 NOTE — ED Notes (Signed)
Pt did not answer call 

## 2020-06-16 NOTE — ED Notes (Signed)
Pt did not respond to call. 

## 2020-06-16 NOTE — ED Triage Notes (Addendum)
Patient states he ran out of his BP medication 2 days ago, today he had central chest pain that woke him up out of sleep. He found one of his medications and took that, isorbide mononitrate, but could not find his losartan 50 mg, or the amlodipine 5mg  or 81 mg aspirin. Denies N/V, numbness tingling, or pain radiation. Cocaine use 2 days ago.

## 2020-06-26 ENCOUNTER — Other Ambulatory Visit: Payer: Self-pay

## 2020-06-26 ENCOUNTER — Inpatient Hospital Stay (HOSPITAL_COMMUNITY)
Admission: EM | Admit: 2020-06-26 | Discharge: 2020-06-27 | DRG: 918 | Disposition: A | Discharge status: Home / Self Care | Payer: Self-pay | Attending: Interventional Cardiology | Admitting: Interventional Cardiology

## 2020-06-26 ENCOUNTER — Observation Stay (HOSPITAL_COMMUNITY): Payer: Self-pay

## 2020-06-26 ENCOUNTER — Encounter (HOSPITAL_COMMUNITY): Payer: Self-pay | Admitting: Student

## 2020-06-26 ENCOUNTER — Emergency Department (HOSPITAL_COMMUNITY): Payer: Self-pay

## 2020-06-26 DIAGNOSIS — Z9114 Patient's other noncompliance with medication regimen: Secondary | ICD-10-CM

## 2020-06-26 DIAGNOSIS — I2510 Atherosclerotic heart disease of native coronary artery without angina pectoris: Secondary | ICD-10-CM | POA: Diagnosis present

## 2020-06-26 DIAGNOSIS — F1410 Cocaine abuse, uncomplicated: Secondary | ICD-10-CM | POA: Diagnosis present

## 2020-06-26 DIAGNOSIS — I214 Non-ST elevation (NSTEMI) myocardial infarction: Secondary | ICD-10-CM | POA: Diagnosis present

## 2020-06-26 DIAGNOSIS — Z8249 Family history of ischemic heart disease and other diseases of the circulatory system: Secondary | ICD-10-CM

## 2020-06-26 DIAGNOSIS — I469 Cardiac arrest, cause unspecified: Secondary | ICD-10-CM | POA: Diagnosis present

## 2020-06-26 DIAGNOSIS — Z7982 Long term (current) use of aspirin: Secondary | ICD-10-CM

## 2020-06-26 DIAGNOSIS — Z79899 Other long term (current) drug therapy: Secondary | ICD-10-CM

## 2020-06-26 DIAGNOSIS — Z20822 Contact with and (suspected) exposure to covid-19: Secondary | ICD-10-CM | POA: Diagnosis present

## 2020-06-26 DIAGNOSIS — I2119 ST elevation (STEMI) myocardial infarction involving other coronary artery of inferior wall: Secondary | ICD-10-CM

## 2020-06-26 DIAGNOSIS — I472 Ventricular tachycardia: Secondary | ICD-10-CM | POA: Diagnosis present

## 2020-06-26 DIAGNOSIS — I428 Other cardiomyopathies: Secondary | ICD-10-CM

## 2020-06-26 DIAGNOSIS — E7800 Pure hypercholesterolemia, unspecified: Secondary | ICD-10-CM | POA: Diagnosis present

## 2020-06-26 DIAGNOSIS — R778 Other specified abnormalities of plasma proteins: Secondary | ICD-10-CM

## 2020-06-26 DIAGNOSIS — I10 Essential (primary) hypertension: Secondary | ICD-10-CM | POA: Diagnosis present

## 2020-06-26 DIAGNOSIS — T405X1A Poisoning by cocaine, accidental (unintentional), initial encounter: Principal | ICD-10-CM | POA: Diagnosis present

## 2020-06-26 DIAGNOSIS — I248 Other forms of acute ischemic heart disease: Secondary | ICD-10-CM | POA: Diagnosis present

## 2020-06-26 DIAGNOSIS — I252 Old myocardial infarction: Secondary | ICD-10-CM

## 2020-06-26 DIAGNOSIS — Z886 Allergy status to analgesic agent status: Secondary | ICD-10-CM

## 2020-06-26 DIAGNOSIS — E785 Hyperlipidemia, unspecified: Secondary | ICD-10-CM | POA: Diagnosis present

## 2020-06-26 LAB — CBC
HCT: 51.6 % (ref 39.0–52.0)
Hemoglobin: 16.8 g/dL (ref 13.0–17.0)
MCH: 30.6 pg (ref 26.0–34.0)
MCHC: 32.6 g/dL (ref 30.0–36.0)
MCV: 94 fL (ref 80.0–100.0)
Platelets: 334 10*3/uL (ref 150–400)
RBC: 5.49 MIL/uL (ref 4.22–5.81)
RDW: 12.7 % (ref 11.5–15.5)
WBC: 9 10*3/uL (ref 4.0–10.5)
nRBC: 0 % (ref 0.0–0.2)

## 2020-06-26 LAB — TSH: TSH: 3.27 u[IU]/mL (ref 0.350–4.500)

## 2020-06-26 LAB — RESP PANEL BY RT-PCR (FLU A&B, COVID) ARPGX2
Influenza A by PCR: NEGATIVE
Influenza B by PCR: NEGATIVE
SARS Coronavirus 2 by RT PCR: NEGATIVE

## 2020-06-26 LAB — BASIC METABOLIC PANEL
Anion gap: 9 (ref 5–15)
BUN: 10 mg/dL (ref 6–20)
CO2: 26 mmol/L (ref 22–32)
Calcium: 8.5 mg/dL — ABNORMAL LOW (ref 8.9–10.3)
Chloride: 102 mmol/L (ref 98–111)
Creatinine, Ser: 1.17 mg/dL (ref 0.61–1.24)
GFR, Estimated: >60 mL/min (ref >60)
Glucose, Bld: 128 mg/dL — ABNORMAL HIGH (ref 70–99)
Potassium: 4.2 mmol/L (ref 3.5–5.1)
Sodium: 137 mmol/L (ref 135–145)

## 2020-06-26 LAB — ECHOCARDIOGRAM COMPLETE
Area-P 1/2: 2.76 cm2
Height: 70 in
S' Lateral: 3.2 cm
Weight: 2560.86 oz

## 2020-06-26 LAB — HIV ANTIBODY (ROUTINE TESTING W REFLEX): HIV Screen 4th Generation wRfx: NONREACTIVE

## 2020-06-26 LAB — TROPONIN I (HIGH SENSITIVITY)
Troponin I (High Sensitivity): 34 ng/L — ABNORMAL HIGH (ref <18)
Troponin I (High Sensitivity): 5418 ng/L (ref <18)

## 2020-06-26 LAB — BRAIN NATRIURETIC PEPTIDE: B Natriuretic Peptide: 97.7 pg/mL (ref 0.0–100.0)

## 2020-06-26 LAB — MAGNESIUM: Magnesium: 2.2 mg/dL (ref 1.7–2.4)

## 2020-06-26 LAB — CBG MONITORING, ED: Glucose-Capillary: 116 mg/dL — ABNORMAL HIGH (ref 70–99)

## 2020-06-26 LAB — HEPARIN LEVEL (UNFRACTIONATED): Heparin Unfractionated: 0.2 IU/mL — ABNORMAL LOW (ref 0.30–0.70)

## 2020-06-26 MED ORDER — ASPIRIN 300 MG RE SUPP: 300.0000 mg | RECTAL | Status: AC

## 2020-06-26 MED ORDER — ATORVASTATIN CALCIUM 80 MG PO TABS
80.0000 mg | ORAL_TABLET | Freq: Every day | ORAL | Status: DC
  Administered 2020-06-26 – 2020-06-27 (×2): 80 mg via ORAL
  Filled 2020-06-26 (×2): qty 1

## 2020-06-26 MED ORDER — MAGNESIUM SULFATE 2 GM/50ML IV SOLN
2.0000 g | Freq: Once | INTRAVENOUS | Status: AC
  Administered 2020-06-26: 2 g via INTRAVENOUS
  Filled 2020-06-26: qty 50

## 2020-06-26 MED ORDER — ASPIRIN 81 MG PO CHEW: 324.0000 mg | CHEWABLE_TABLET | ORAL | Status: AC

## 2020-06-26 MED ORDER — ONDANSETRON HCL 4 MG/2ML IJ SOLN
4.0000 mg | Freq: Once | INTRAMUSCULAR | Status: AC
  Administered 2020-06-26: 4 mg via INTRAVENOUS
  Filled 2020-06-26: qty 2

## 2020-06-26 MED ORDER — ASPIRIN EC 81 MG PO TBEC
81.0000 mg | DELAYED_RELEASE_TABLET | Freq: Every day | ORAL | Status: DC
  Administered 2020-06-27: 81 mg via ORAL
  Filled 2020-06-26: qty 1

## 2020-06-26 MED ORDER — LIDOCAINE IN D5W 4-5 MG/ML-% IV SOLN
1.0000 mg/min | INTRAVENOUS | Status: DC
  Administered 2020-06-26: 1 mg/min via INTRAVENOUS
  Filled 2020-06-26: qty 500

## 2020-06-26 MED ORDER — NITROGLYCERIN IN D5W 200-5 MCG/ML-% IV SOLN
0.0000 ug/min | INTRAVENOUS | Status: DC
  Administered 2020-06-26: 25 ug/min via INTRAVENOUS
  Filled 2020-06-26: qty 250

## 2020-06-26 MED ORDER — AMLODIPINE BESYLATE 5 MG PO TABS
5.0000 mg | ORAL_TABLET | Freq: Every day | ORAL | Status: DC
  Administered 2020-06-26 – 2020-06-27 (×2): 5 mg via ORAL
  Filled 2020-06-26 (×2): qty 1

## 2020-06-26 MED ORDER — LIDOCAINE BOLUS VIA INFUSION
75.0000 mg | Freq: Once | INTRAVENOUS | Status: AC
  Administered 2020-06-26: 75 mg via INTRAVENOUS
  Filled 2020-06-26: qty 76

## 2020-06-26 MED ORDER — NITROGLYCERIN 0.4 MG SL SUBL: 0.4000 mg | SUBLINGUAL_TABLET | SUBLINGUAL | Status: DC | PRN

## 2020-06-26 MED ORDER — LOSARTAN POTASSIUM 25 MG PO TABS
25.0000 mg | ORAL_TABLET | Freq: Every day | ORAL | Status: DC
  Administered 2020-06-26 – 2020-06-27 (×2): 25 mg via ORAL
  Filled 2020-06-26 (×2): qty 1

## 2020-06-26 MED ORDER — HEPARIN (PORCINE) 25000 UT/250ML-% IV SOLN
1100.0000 [IU]/h | INTRAVENOUS | Status: DC
  Administered 2020-06-26: 900 [IU]/h via INTRAVENOUS
  Filled 2020-06-26: qty 250

## 2020-06-26 MED ORDER — HEPARIN BOLUS VIA INFUSION
3500.0000 [IU] | Freq: Once | INTRAVENOUS | Status: AC
  Administered 2020-06-26: 3500 [IU] via INTRAVENOUS
  Filled 2020-06-26: qty 3500

## 2020-06-26 MED ORDER — ASPIRIN 81 MG PO CHEW
324.0000 mg | CHEWABLE_TABLET | Freq: Once | ORAL | Status: AC
  Administered 2020-06-26: 324 mg via ORAL
  Filled 2020-06-26: qty 4

## 2020-06-26 MED ORDER — MORPHINE SULFATE (PF) 4 MG/ML IV SOLN
4.0000 mg | Freq: Once | INTRAVENOUS | Status: AC
  Administered 2020-06-26: 4 mg via INTRAVENOUS
  Filled 2020-06-26: qty 1

## 2020-06-26 MED ORDER — NITROGLYCERIN 0.4 MG SL SUBL
0.4000 mg | SUBLINGUAL_TABLET | SUBLINGUAL | Status: DC | PRN
  Administered 2020-06-26: 0.4 mg via SUBLINGUAL
  Filled 2020-06-26: qty 1

## 2020-06-26 NOTE — Progress Notes (Signed)
ANTICOAGULATION CONSULT NOTE - Initial Consult  Pharmacy Consult for heparin Indication: chest pain/ACS  Allergies  Allergen Reactions  . Propoxyphene N-Acetaminophen Nausea Only    REACTION: GI upset    Patient Measurements: Height: 5\' 10"  (177.8 cm) Weight: 72.6 kg (160 lb 0.9 oz) IBW/kg (Calculated) : 73 Heparin Dosing Weight: 72.6 kg  Vital Signs: Temp: 97.9 F (36.6 C) (01/12 0200) Temp Source: Oral (01/12 0200) BP: 156/96 (01/12 0230) Pulse Rate: 77 (01/12 0230)  Labs: No results for input(s): HGB, HCT, PLT, APTT, LABPROT, INR, HEPARINUNFRC, HEPRLOWMOCWT, CREATININE, CKTOTAL, CKMB, TROPONINIHS in the last 72 hours.  Estimated Creatinine Clearance: 78.5 mL/min (by C-G formula based on SCr of 1.04 mg/dL).   Medical History: Past Medical History:  Diagnosis Date  . Cocaine abuse (HCC)   . Complication of anesthesia   . H/O ventricular fibrillation 2016   s/p cocaine use  . High cholesterol   . Hypertension   . MI (mitral incompetence)   . Myocardial infarction (HCC) 2016; ?06/2017   "both related to cocaine" (03/18/2018)  . Nonobstructive atherosclerosis of coronary artery 2016   LHC showed normal EF and mild non-obstructive CAD    Medications:  See medication history  Assessment: 60 yo man to start heparin for CP.  He was not on anticoagulation PTA.  Goal of Therapy:  Heparin level 0.3-0.7 units/ml Monitor platelets by anticoagulation protocol: Yes   Plan:  Heparin bolus 3500 units and drip at 900 units/hr Check heparin level in 6-8 hours Daily HL and CBC Monitor for bleeding complications  Thanks for allowing pharmacy to be a part of this patient's care.  60, PharmD Clinical Pharmacist 06/26/2020,2:37 AM

## 2020-06-26 NOTE — Progress Notes (Signed)
ANTICOAGULATION CONSULT NOTE  Pharmacy Consult for heparin Indication: chest pain/ACS  Allergies  Allergen Reactions  . Propoxyphene N-Acetaminophen Nausea Only    REACTION: GI upset    Patient Measurements: Height: 5\' 10"  (177.8 cm) Weight: 72.6 kg (160 lb 0.9 oz) IBW/kg (Calculated) : 73 Heparin Dosing Weight: 72.6 kg  Vital Signs: Temp: 97.6 F (36.4 C) (01/12 1100) Temp Source: Oral (01/12 1100) BP: 131/88 (01/12 1100) Pulse Rate: 69 (01/12 1100)  Labs: Recent Labs    06/26/20 0156 06/26/20 0424 06/26/20 1034  HGB 16.8  --   --   HCT 51.6  --   --   PLT 334  --   --   HEPARINUNFRC  --   --  0.20*  CREATININE 1.17  --   --   TROPONINIHS 34* 5,418*  --     Estimated Creatinine Clearance: 69.8 mL/min (by C-G formula based on SCr of 1.17 mg/dL).   Medical History: Past Medical History:  Diagnosis Date  . Cocaine abuse (HCC)   . Complication of anesthesia   . H/O ventricular fibrillation 2016   s/p cocaine use  . High cholesterol   . Hypertension   . MI (mitral incompetence)   . Myocardial infarction (HCC) 2016; ?06/2017   "both related to cocaine" (03/18/2018)  . Nonobstructive atherosclerosis of coronary artery 2016   LHC showed normal EF and mild non-obstructive CAD    Medications:  See medication history  Assessment: 60 yo man to start heparin for CP.  He was not on anticoagulation PTA.   Initial heparin level 0.2 on 900 units/hr. No bleeding or IV issues noted.   Goal of Therapy:  Heparin level 0.3-0.7 units/ml Monitor platelets by anticoagulation protocol: Yes   Plan:  Heparin increased to 1100 units/hr Check heparin level in 6-8 hours Daily HL and CBC Monitor for bleeding complications  Thanks for allowing pharmacy to be a part of this patient's care.  46 PharmD., BCPS Clinical Pharmacist 06/26/2020 1:10 PM

## 2020-06-26 NOTE — Progress Notes (Signed)
  Echocardiogram 2D Echocardiogram has been performed.  Dorena Dew Stefanos Haynesworth 06/26/2020, 9:37 AM

## 2020-06-26 NOTE — ED Provider Notes (Signed)
[facility] EMERGENCY DEPARTMENT Provider Note   CSN: [id number] Arrival date & time: 06/26/20  0149     History Chief Complaint  Patient presents with  . Chest Pain    Bryce Ortega is a 60 y.o. male.  60 yo M with a chief complaint of chest pain.  This has been going on for the past hour or so.  Patient had done cocaine a couple hours ago.  Feels like the last time he had a heart attack.  Sharp left-sided radiates into the left arm.  Having shortness of breath with it.  No diaphoresis nausea or vomiting.  He denies cough congestion or fever.  Denies abdominal pain.  Denies lower extremity edema.  Has a history of hypertension has been off his medications.  The history is provided by the patient and the EMS personnel.  Chest Pain Pain location:  L chest Pain quality: sharp and shooting   Pain radiates to:  L arm Pain severity:  Severe Onset quality:  Sudden Duration:  1 hour Timing:  Constant Progression:  Worsening Chronicity:  New Relieved by:  Nothing Worsened by:  Nothing Ineffective treatments:  None tried Associated symptoms: shortness of breath   Associated symptoms: no abdominal pain, no fever, no headache, no palpitations and no vomiting        Past Medical History:  Diagnosis Date  . Cocaine abuse (HCC)   . Complication of anesthesia   . H/O ventricular fibrillation 2016   s/p cocaine use  . High cholesterol   . Hypertension   . MI (mitral incompetence)   . Myocardial infarction (HCC) 2016; ?06/2017   "both related to cocaine" (03/18/2018)  . Nonobstructive atherosclerosis of coronary artery 2016   LHC showed normal EF and mild non-obstructive CAD    Patient Active Problem List   Diagnosis Date Noted  . AKI (acute kidney injury) (HCC) 07/07/2017  . Ischemia, myocardial, acute (HCC) 07/06/2017  . Elevated troponin   . Cocaine adverse reaction 02/23/2017  . Chest pain 02/22/2017  . Mild CAD 02/22/2017  . NSTEMI (non-ST elevated  myocardial infarction) (HCC) 05/15/2015  . Cocaine abuse (HCC) 05/15/2015  . Chest pain at rest   . Cardiac arrest (HCC) 05/10/2015  . Hyperlipidemia 05/10/2015  . Essential hypertension 01/10/2009  . SHOULDER PAIN, RIGHT 01/10/2009  . CERVICALGIA 01/10/2009  . LOW BACK PAIN 01/10/2009    Past Surgical History:  Procedure Laterality Date  . CARDIAC CATHETERIZATION N/A 05/10/2015   Procedure: Left Heart Cath and Coronary Angiography;  Surgeon: Dolores Patty, MD;  Location: [facility] INVASIVE CV LAB;  Service: Cardiovascular;  Laterality: N/A;  . LEFT HEART CATH AND CORONARY ANGIOGRAPHY N/A 12/27/2017   Procedure: LEFT HEART CATH AND CORONARY ANGIOGRAPHY;  Surgeon: Lyn Records, MD;  Location: [facility] INVASIVE CV LAB;  Service: Cardiovascular;  Laterality: N/A;  . TONSILLECTOMY         Family History  Problem Relation Age of Onset  . Hypertension Father   . CAD Father        s/p CABG  . Heart disease Mother   . Heart disease Maternal Grandfather     Social History   Tobacco Use  . Smoking status: Never Smoker  . Smokeless tobacco: Never Used  Vaping Use  . Vaping Use: Never used  Substance Use Topics  . Alcohol use: Yes    Alcohol/week: 13.0 standard drinks    Types: 13 Cans of beer per week    Comment: 10/4//2019 "~  2 40 oz beers 2x/week"  . Drug use: Yes    Types: Cocaine    Comment: 10/4//2019 ""maybe once/month"    Home Medications Prior to Admission medications   Medication Sig Start Date End Date Taking? Authorizing Provider  amLODipine (NORVASC) 5 MG tablet Take 1 tablet (5 mg total) by mouth daily. 03/20/18   Hongalgi, Maximino Greenland, MD  aspirin EC 81 MG tablet Take 1 tablet (81 mg total) by mouth daily. 03/20/18   Hongalgi, Maximino Greenland, MD  isosorbide mononitrate (IMDUR) 30 MG 24 hr tablet Take 1 tablet (30 mg total) by mouth daily. 03/20/18   Hongalgi, Maximino Greenland, MD  losartan (COZAAR) 25 MG tablet Take 1 tablet (25 mg total) by mouth daily. 03/20/18   Hongalgi, Maximino Greenland, MD   nitroGLYCERIN (NITROSTAT) 0.4 MG SL tablet Place 1 tablet (0.4 mg total) under the tongue every 5 (five) minutes as needed for chest pain (for 3 doses). 03/20/18   Hongalgi, Maximino Greenland, MD    Allergies    Propoxyphene n-acetaminophen  Review of Systems   Review of Systems  Constitutional: Negative for chills and fever.  HENT: Negative for congestion and facial swelling.   Eyes: Negative for discharge and visual disturbance.  Respiratory: Positive for shortness of breath.   Cardiovascular: Positive for chest pain. Negative for palpitations.  Gastrointestinal: Negative for abdominal pain, diarrhea and vomiting.  Musculoskeletal: Negative for arthralgias and myalgias.  Skin: Negative for color change and rash.  Neurological: Negative for tremors, syncope and headaches.  Psychiatric/Behavioral: Negative for confusion and dysphoric mood.    Physical Exam Updated Vital Signs BP (!) 185/119   Pulse 71   Temp 97.9 F (36.6 C) (Oral)   Resp 15   Ht 5\' 10"  (1.778 m)   Wt 72.6 kg   SpO2 98%   BMI 22.97 kg/m   Physical Exam Vitals and nursing note reviewed.  Constitutional:      Appearance: He is well-developed and well-nourished.  HENT:     Head: Normocephalic and atraumatic.  Eyes:     Extraocular Movements: EOM normal.     Pupils: Pupils are equal, round, and reactive to light.  Neck:     Vascular: No JVD.  Cardiovascular:     Rate and Rhythm: Normal rate and regular rhythm.     Heart sounds: No murmur heard. No friction rub. No gallop.   Pulmonary:     Effort: No respiratory distress.     Breath sounds: No wheezing.  Chest:     Chest wall: Tenderness present.     Comments: Tenderness to the left chest wall about the medial clavicular line ribs 4 through 6. Abdominal:     General: There is no distension.     Tenderness: There is no guarding or rebound.  Musculoskeletal:        General: Normal range of motion.     Cervical back: Normal range of motion and neck supple.   Skin:    Coloration: Skin is not pale.     Findings: No rash.  Neurological:     Mental Status: He is alert and oriented to person, place, and time.  Psychiatric:        Mood and Affect: Mood and affect normal.        Behavior: Behavior normal.     ED Results / Procedures / Treatments   Labs (all labs ordered are listed, but only abnormal results are displayed) Labs Reviewed  BASIC METABOLIC PANEL - Abnormal; Notable  for the following components:      Result Value   Glucose, Bld 128 (*)    Calcium 8.5 (*)    All other components within normal limits  CBG MONITORING, ED - Abnormal; Notable for the following components:   Glucose-Capillary 116 (*)    All other components within normal limits  TROPONIN I (HIGH SENSITIVITY) - Abnormal; Notable for the following components:   Troponin I (High Sensitivity) 34 (*)    All other components within normal limits  RESP PANEL BY RT-PCR (FLU A&B, COVID) ARPGX2  CBC  HEPARIN LEVEL (UNFRACTIONATED)  TROPONIN I (HIGH SENSITIVITY)    EKG None  Radiology DG Chest Portable 1 View  Result Date: 06/26/2020 CLINICAL DATA:  Chest pain EXAM: PORTABLE CHEST 1 VIEW COMPARISON:  06/16/2020 FINDINGS: The heart size and mediastinal contours are within normal limits. Both lungs are clear. The visualized skeletal structures are unremarkable. IMPRESSION: No active disease. Electronically Signed   By: Deatra Robinson M.D.   On: 06/26/2020 02:28    Procedures Procedures (including critical care time)  Medications Ordered in ED Medications  nitroGLYCERIN (NITROSTAT) SL tablet 0.4 mg (0.4 mg Sublingual Given 06/26/20 0220)  heparin ADULT infusion 100 units/mL (25000 units/29mL) (900 Units/hr Intravenous New Bag/Given 06/26/20 0404)  nitroGLYCERIN 50 mg in dextrose 5 % 250 mL (0.2 mg/mL) infusion (25 mcg/min Intravenous New Bag/Given 06/26/20 0407)  aspirin chewable tablet 324 mg (324 mg Oral Given 06/26/20 0225)  morphine 4 MG/ML injection 4 mg (4 mg  Intravenous Given 06/26/20 0222)  ondansetron (ZOFRAN) injection 4 mg (4 mg Intravenous Given 06/26/20 0221)  heparin bolus via infusion 3,500 Units (3,500 Units Intravenous Bolus from Bag 06/26/20 0405)    ED Course  I have reviewed the triage vital signs and the nursing notes.  Pertinent labs & imaging results that were available during my care of the patient were reviewed by me and considered in my medical decision making (see chart for details).    MDM Rules/Calculators/A&P                          60 yo M with a significant past medical history of cocaine abuse comes in with a chief complaint of chest pain.  Started having pain shortly after using cocaine.  Will obtain a laboratory evaluation and EKG.  Initial EKG was concerning for an inferior ST elevation MI.  He quickly went into a more rapid wide rhythm with a less than a rate of 120.  He felt much better with this rhythm.  After a few moments then went into a normal sinus without any obvious ST changes.  Will discuss with the STEMI cardiologist.  I discussed case with Dr. [place], cardiology he independently reviewed the EKGs and based on my history and the patient currently being pain-free and no longer having ST elevation he did not feel that he would benefit from activation of the Cath Lab.  Did recommend starting him on heparin and nitro and suggested that I call the cardiology fellow for a bedside evaluation.  CRITICAL CARE Performed by: Rae Roam   Total critical care time: 80 minutes  Critical care time was exclusive of separately billable procedures and treating other patients.  Critical care was necessary to treat or prevent imminent or life-threatening deterioration.  Critical care was time spent personally by me on the following activities: development of treatment plan with patient and/or surrogate as well as nursing, discussions with consultants,  evaluation of patient's response to treatment, examination of  patient, obtaining history from patient or surrogate, ordering and performing treatments and interventions, ordering and review of laboratory studies, ordering and review of radiographic studies, pulse oximetry and re-evaluation of patient's condition.  The patients results and plan were reviewed and discussed.   Any x-rays performed were independently reviewed by myself.   Differential diagnosis were considered with the presenting HPI.  Medications  nitroGLYCERIN (NITROSTAT) SL tablet 0.4 mg (0.4 mg Sublingual Given 06/26/20 0220)  heparin ADULT infusion 100 units/mL (25000 units/24mL) (900 Units/hr Intravenous New Bag/Given 06/26/20 0404)  nitroGLYCERIN 50 mg in dextrose 5 % 250 mL (0.2 mg/mL) infusion (25 mcg/min Intravenous New Bag/Given 06/26/20 0407)  aspirin chewable tablet 324 mg (324 mg Oral Given 06/26/20 0225)  morphine 4 MG/ML injection 4 mg (4 mg Intravenous Given 06/26/20 0222)  ondansetron (ZOFRAN) injection 4 mg (4 mg Intravenous Given 06/26/20 0221)  heparin bolus via infusion 3,500 Units (3,500 Units Intravenous Bolus from Bag 06/26/20 0405)    Vitals:   06/26/20 0315 06/26/20 0330 06/26/20 0345 06/26/20 0400  BP: (!) 146/104 (!) 168/113 (!) 183/118 (!) 185/119  Pulse: 74 70 76 71  Resp: 14 18 19 15   Temp:      TempSrc:      SpO2: 95% 97% 97% 98%  Weight:      Height:        Final diagnoses:  Acute ST elevation myocardial infarction (STEMI) of inferior wall (HCC)    Admission/ observation were discussed with the admitting physician, patient and/or family and they are comfortable with the plan.    Final Clinical Impression(s) / ED Diagnoses Final diagnoses:  Acute ST elevation myocardial infarction (STEMI) of inferior wall [facility])    Rx / DC Orders ED Discharge Orders    None       [facility], DO 06/26/20 0411

## 2020-06-26 NOTE — H&P (Addendum)
Cardiology H&P    Patient ID: Bryce Ortega MRN: [id number], DOB/AGE: Jul 23, 1960   Admit date: 06/26/2020 Date of Consult: 06/26/2020  Primary Physician: Lavinia Sharps, NP Primary Cardiologist: Lesleigh Noe, MD  Patient Profile    Bryce Ortega is a 60 y.o. male with a history of cocaine use disorder, VF arrest following cocaine use, mitral regurgitation, non-obstructive CAD, cocaine induced MI who presented to ED with chest pain following cocaine use this evening.  History of Present Illness    60 year old male with past medical history as above who presented early this morning after he developed chest pain following cocaine insufflation.  On arrival to the ED ECG with inferior STE and some reciprocal anterior changes.  On review of telemetry he then developed polymorphic VT followed by a wide complex regular rhythm captured on ECG, favor AIVR v. Slow VT (a bit fast for AIVR @ 112).  This resolved to normal sinus rhythm without ST deviation.  He was paged as STEMI and decided not to activate.  I evaluated at bedside subsequently where he was having persistent chest pain and inferior ST deviations.  Started on nitroglycerin with a contemporaneous COVID swab which was quite upsetting to him.  Unclear if the adrenergic surge from the swab or steal in the setting of vasospasm provoked recurrent frequent episodes of fast NSVT (~170), not captured on ECG.  I gave magnesium and started on lidocaine infusion at that point.  Ventricular ectopy has abated and he was chest pain free at time of my departure resting comfortably.  He reports that he didn't use "much cocaine" and regretted the choice.  He is on multiple antihypertensives but has not taken them in a week as he ran out.  When he's not using cocaine he doesn't have shortness of breath or chest pain to speak of.  He had a similar presentation in October 2019 following an admission in July during which he underwent a coronary  angiogram which showed mild/moderate eccentric disease of mid/distal LAD, patent LCx, moderate ostial disease of LCx, and mid RCA w/ 30% stenosis.  He was discharged on vasodilators and has done well with rare relapses.  Past Medical History   Past Medical History:  Diagnosis Date  . Cocaine abuse (HCC)   . Complication of anesthesia   . H/O ventricular fibrillation 2016   s/p cocaine use  . High cholesterol   . Hypertension   . MI (mitral incompetence)   . Myocardial infarction (HCC) 2016; ?06/2017   "both related to cocaine" (03/18/2018)  . Nonobstructive atherosclerosis of coronary artery 2016   LHC showed normal EF and mild non-obstructive CAD    Past Surgical History:  Procedure Laterality Date  . CARDIAC CATHETERIZATION N/A 05/10/2015   Procedure: Left Heart Cath and Coronary Angiography;  Surgeon: Dolores Patty, MD;  Location: [facility] INVASIVE CV LAB;  Service: Cardiovascular;  Laterality: N/A;  . LEFT HEART CATH AND CORONARY ANGIOGRAPHY N/A 12/27/2017   Procedure: LEFT HEART CATH AND CORONARY ANGIOGRAPHY;  Surgeon: Lyn Records, MD;  Location: [facility] INVASIVE CV LAB;  Service: Cardiovascular;  Laterality: N/A;  . TONSILLECTOMY       Allergies  Allergen Reactions  . Propoxyphene N-Acetaminophen Nausea Only    REACTION: GI upset   Inpatient Medications    . amLODipine  5 mg Oral Daily  . aspirin  324 mg Oral NOW   Or  . aspirin  300 mg Rectal NOW  . Melene Muller  ON 06/27/2020] aspirin EC  81 mg Oral Daily  . atorvastatin  80 mg Oral Daily  . losartan  25 mg Oral Daily    Family History    Family History  Problem Relation Age of Onset  . Hypertension Father   . CAD Father        s/p CABG  . Heart disease Mother   . Heart disease Maternal Grandfather    He indicated that the status of his mother is unknown. He indicated that the status of his father is unknown. He indicated that his maternal grandmother is deceased. He indicated that his maternal grandfather is deceased.  He indicated that his paternal grandmother is deceased. He indicated that his paternal grandfather is deceased.   Social History    Social History   Socioeconomic History  . Marital status: Divorced    Spouse name: Not on file  . Number of children: Not on file  . Years of education: Not on file  . Highest education level: Not on file  Occupational History  . Not on file  Tobacco Use  . Smoking status: Never Smoker  . Smokeless tobacco: Never Used  Vaping Use  . Vaping Use: Never used  Substance and Sexual Activity  . Alcohol use: Yes    Alcohol/week: 13.0 standard drinks    Types: 13 Cans of beer per week    Comment: 10/4//2019 "~ 2 40 oz beers 2x/week"  . Drug use: Yes    Types: Cocaine    Comment: 10/4//2019 ""maybe once/month"  . Sexual activity: Yes  Other Topics Concern  . Not on file  Social History Narrative   ** Merged History Encounter **       Social Determinants of Health   Financial Resource Strain: Not on file  Food Insecurity: Not on file  Transportation Needs: Not on file  Physical Activity: Not on file  Stress: Not on file  Social Connections: Not on file  Intimate Partner Violence: Not on file     Review of Systems    General:  No chills, fever, night sweats or weight changes.  Cardiovascular:  No chest pain, dyspnea on exertion, edema, orthopnea, palpitations, paroxysmal nocturnal dyspnea. Dermatological: No rash, lesions/masses Respiratory: No cough, dyspnea Urologic: No hematuria, dysuria Abdominal:   No nausea, vomiting, diarrhea, bright red blood per rectum, melena, or hematemesis Neurologic:  No visual changes, wkns, changes in mental status. All other systems reviewed and are otherwise negative except as noted above.  Physical Exam    Blood pressure (!) 185/119, pulse 71, temperature 97.9 F (36.6 C), temperature source Oral, resp. rate 15, height 5\' 10"  (1.778 m), weight 72.6 kg, SpO2 98 %.    No intake or output data in the 24  hours ending 06/26/20 0501 Wt Readings from Last 3 Encounters:  06/26/20 72.6 kg  06/16/20 72.6 kg  09/26/19 74.8 kg    CONSTITUTIONAL: alert and conversant, well-appearing, nourished, no distress HEENT: oropharynx clear and moist, no mucosal lesions, normal dentition, conjunctiva normal, EOM intact, pupils equal, no lid lag. NECK: supple, no cervical adenopathy, no thyromegaly CARDIOVASCULAR: Regular rhythm. No gallop, murmur, or rub. Normal S1/S2. Radial pulses intact. JVP 10 cm H20. No carotid bruits. PULMONARY/CHEST WALL: no deformities, normal breath sounds bilaterally, normal work of breathing ABDOMINAL: soft, non-tender, non-distended EXTREMITIES: no edema or muscle atrophy, warm and well-perfused SKIN: Dry and intact without apparent rashes or wounds. NEUROLOGIC: alert, normal gait, no abnormal movements, cranial nerves grossly intact.   Labs  Troponin (Point of Care Test) No results for input(s): TROPIPOC in the last 72 hours. No results for input(s): CKTOTAL, CKMB, TROPONINI in the last 72 hours. Lab Results  Component Value Date   WBC 9.0 06/26/2020   HGB 16.8 06/26/2020   HCT 51.6 06/26/2020   MCV 94.0 06/26/2020   PLT 334 06/26/2020    Recent Labs  Lab 06/26/20 0156  NA 137  K 4.2  CL 102  CO2 26  BUN 10  CREATININE 1.17  CALCIUM 8.5*  GLUCOSE 128*   Lab Results  Component Value Date   CHOL 134 07/07/2017   HDL 52 07/07/2017   LDLCALC 71 07/07/2017   TRIG 55 07/07/2017   Lab Results  Component Value Date   DDIMER <0.27 04/16/2017     Radiology Studies    DG Chest 2 View  Result Date: 06/16/2020 CLINICAL DATA:  Chest pain EXAM: CHEST - 2 VIEW COMPARISON:  03/18/2018 chest radiograph. FINDINGS: Stable cardiomediastinal silhouette with normal heart size. No pneumothorax. No pleural effusion. Lungs appear clear, with no acute consolidative airspace disease and no pulmonary edema. IMPRESSION: No active cardiopulmonary disease. Electronically Signed    By: Delbert Phenix M.D.   On: 06/16/2020 10:50   DG Chest Portable 1 View  Result Date: 06/26/2020 CLINICAL DATA:  Chest pain EXAM: PORTABLE CHEST 1 VIEW COMPARISON:  06/16/2020 FINDINGS: The heart size and mediastinal contours are within normal limits. Both lungs are clear. The visualized skeletal structures are unremarkable. IMPRESSION: No active disease. Electronically Signed   By: Deatra Robinson M.D.   On: 06/26/2020 02:28    ECG & Cardiac Imaging   [facility] 12/27/17  Widely patent coronary arteries.  Coronary anatomy.  Left main is normal.  The LAD contains 30 to 40% mid eccentric narrowing after the second diagonal.  The LAD does not wraparound the apex.  The distal segment demonstrates some evidence of systolic compression.  The circumflex is widely patent.  The second and third obtuse marginals contained 40% ostial narrowing.  Dominant vessel with eccentric 30% mid vessel narrowing.  Non-ST elevation myocardial infarction affecting the anterolateral wall, possibly related to coronary vasospasm in the setting of cocaine use versus atypical Takotsubo stress cardiomyopathy.  TTE7/15/19 - Left ventricle: The cavity size was normal. Wall thickness was  normal. Systolic function was normal. The estimated ejection  fraction was in the range of 60% to 65%. Wall motion was normal;  there were no regional wall motion abnormalities. Doppler  parameters are consistent with abnormal left ventricular  relaxation (grade 1 diastolic dysfunction).  - Aortic valve: There was trivial regurgitation.  Impressions: - Normal LV function; mild diastolic dysfunction; trace AI.    Assessment & Plan    60 year old male with history of cocaine use disorder, hypertension, prior VF arrest in setting of cocaine use, cocaine-induced NSTEMI 12/2017, non-obstructive CAD presenting with cocaine-induced MI and ventricular arrhythmias.  ECG progression shows inferior STEMI morphology followed by induced VT  and subsequent reperfusion rhythm w/ slow VT/AIVR.  Troponin is mildly elevated and chest pain is resolving on nitroglycerin infusion.  Started on lidocaine drip given recurrent ventricular ectopy during my evaluation but this can likely be weaned off this morning as the sympathomimetic washes out.  Favor noninvasive management given medication nonadherence.  Will check echocardiogram with elevated JVP to ensure he hasn't progressed to LV dysfunction with continued sympathomimetic abuse.  Problem list Cocaine-induced myocardial infarction Ventricular tachycardia Hypertension Cocaine use disorder Medication nonadherence  Plan #Cocaine-induced myocardial  infarction - ASA 325 mg and aspirin 81 mg indefinitely - Consider DAPT, 6 month course, if non-invasive strategy decided - High intensity statin - Risk stratification labs - Check echocardiogram  #Ventricular tachycardia - Mg+ bolus - Lidocaine bolus @ 1 mg/kg and infusion @ 1 mg/min, stop once electrically quiet and chest pain resolved. - Favor avoiding long-term antiarrhythmic as no malignant arrhythmias known outside context of cocaine use.  #Hypertension - Resume home amlodipine, isosorbide  #Cocaine use disorder - Discussed importance of absolute abstinence with patient.  Dispo: Telemetry Full Code  Signed, Regino Schultze, MD 06/26/2020, 5:01 AM   I have examined the patient and reviewed assessment and plan and discussed with patient.  Agree with above as stated.    I personally reviewed the ECGs and lab results this morning and agreed with the decision to not cath the patient emergently.  I reviewed the echo done urgently this morning.  It does appear that his LVEF is preserved and that he has normal inferior wall motion.  Despite elevated troponin, I don't think repeat cath is needed at this time.   I suspect his arrhythmia and ECG changes were related to his cocaine use.  Will d/c IV lidocaine and watch on tele.  If  rhythm is stable, ok to discharge tomorrow.   Lance Muss   For questions or updates, please contact   Please consult [email or web address] for contact info under Cardiology/STEMI.

## 2020-06-26 NOTE — ED Notes (Signed)
Echo at bedside

## 2020-06-26 NOTE — ED Triage Notes (Signed)
Pt from home by EMS; says snorted crack about 2.5 hours ago, then has CP about 1.5 hours ago; c/o CP on arrival.

## 2020-06-27 DIAGNOSIS — I10 Essential (primary) hypertension: Secondary | ICD-10-CM

## 2020-06-27 DIAGNOSIS — E785 Hyperlipidemia, unspecified: Secondary | ICD-10-CM

## 2020-06-27 DIAGNOSIS — I214 Non-ST elevation (NSTEMI) myocardial infarction: Secondary | ICD-10-CM

## 2020-06-27 LAB — BASIC METABOLIC PANEL
Anion gap: 9 (ref 5–15)
BUN: 18 mg/dL (ref 6–20)
CO2: 25 mmol/L (ref 22–32)
Calcium: 8.6 mg/dL — ABNORMAL LOW (ref 8.9–10.3)
Chloride: 106 mmol/L (ref 98–111)
Creatinine, Ser: 1.4 mg/dL — ABNORMAL HIGH (ref 0.61–1.24)
GFR, Estimated: 58 mL/min — ABNORMAL LOW (ref >60)
Glucose, Bld: 115 mg/dL — ABNORMAL HIGH (ref 70–99)
Potassium: 4.2 mmol/L (ref 3.5–5.1)
Sodium: 140 mmol/L (ref 135–145)

## 2020-06-27 LAB — CBC
HCT: 49.8 % (ref 39.0–52.0)
Hemoglobin: 16.8 g/dL (ref 13.0–17.0)
MCH: 31.6 pg (ref 26.0–34.0)
MCHC: 33.7 g/dL (ref 30.0–36.0)
MCV: 93.8 fL (ref 80.0–100.0)
Platelets: 318 10*3/uL (ref 150–400)
RBC: 5.31 MIL/uL (ref 4.22–5.81)
RDW: 13.2 % (ref 11.5–15.5)
WBC: 8.4 10*3/uL (ref 4.0–10.5)
nRBC: 0 % (ref 0.0–0.2)

## 2020-06-27 LAB — LIPID PANEL
Cholesterol: 156 mg/dL (ref 0–200)
HDL: 50 mg/dL (ref >40)
LDL Cholesterol: 83 mg/dL (ref 0–99)
Total CHOL/HDL Ratio: 3.1 RATIO
Triglycerides: 113 mg/dL (ref <150)
VLDL: 23 mg/dL (ref 0–40)

## 2020-06-27 MED ORDER — ATORVASTATIN CALCIUM 40 MG PO TABS: 40.0000 mg | ORAL_TABLET | Freq: Every day | ORAL | 0 refills | Status: DC

## 2020-06-27 NOTE — Plan of Care (Signed)
  Problem: Education: Goal: Knowledge of General Education information will improve Description: Including pain rating scale, medication(s)/side effects and non-pharmacologic comfort measures Outcome: Progressing   Problem: Health Behavior/Discharge Planning: Goal: Ability to manage health-related needs will improve Outcome: Progressing   Problem: Clinical Measurements: Goal: Ability to maintain clinical measurements within normal limits will improve Outcome: Progressing Goal: Will remain free from infection Outcome: Progressing   Problem: Activity: Goal: Risk for activity intolerance will decrease Outcome: Progressing   Problem: Nutrition: Goal: Adequate nutrition will be maintained Outcome: Progressing   Problem: Coping: Goal: Level of anxiety will decrease Outcome: Progressing   Problem: Pain Managment: Goal: General experience of comfort will improve Outcome: Progressing   Problem: Safety: Goal: Ability to remain free from injury will improve Outcome: Progressing   

## 2020-06-27 NOTE — Discharge Summary (Addendum)
Discharge Summary    Patient ID: MESHULEM DUNAGAN MRN: [id number]; DOB: May 30, 1961  Admit date: 06/26/2020 Discharge date: 06/27/2020  Primary Care Provider: Lavinia Sharps, NP  Primary Cardiologist: Lesleigh Noe, MD  Primary Electrophysiologist:  None   Discharge Diagnoses    Principal Problem:   NSTEMI (non-ST elevated myocardial infarction) [facility]) Active Problems:   Essential hypertension   Cardiac arrest (HCC)   Hyperlipidemia   Cocaine abuse (HCC)    Diagnostic Studies/Procedures    Echo: 06/26/20  IMPRESSIONS    1. Septal and inferior wall hypokinesis . Left ventricular ejection  fraction, by estimation, is 45 to 50%. The left ventricle has mildly  decreased function. The left ventricle has no regional wall motion  abnormalities. There is mild asymmetric left  ventricular hypertrophy of the basal and septal segments. Left ventricular  diastolic parameters were normal.  2. Right ventricular systolic function is normal. The right ventricular  size is normal.  3. The mitral valve is normal in structure. Trivial mitral valve  regurgitation. No evidence of mitral stenosis.  4. The aortic valve is normal in structure. Aortic valve regurgitation is  not visualized. No aortic stenosis is present.  5. The inferior vena cava is normal in size with greater than 50%  respiratory variability, suggesting right atrial pressure of 3 mmHg.  _____________   History of Present Illness     Bryce Ortega is a 60 y.o. male with a history of cocaine use disorder, VF arrest following cocaine use, mitral regurgitation, non-obstructive CAD, cocaine induced MI who presented to ED with chest pain following cocaine use. Presented early the morning of admission after he developed chest pain following cocaine insufflation.  On arrival to the ED ECG with inferior STE and some reciprocal anterior changes.  On review of telemetry he then developed polymorphic VT followed by a wide  complex regular rhythm captured on ECG, favor AIVR v. Slow VT (a bit fast for AIVR @ 112).  This resolved to normal sinus rhythm without ST deviation.  He was paged as STEMI and decided not to activate.  He was evaluated at bedside subsequently where he was having persistent chest pain and inferior ST deviations.  Started on nitroglycerin with a contemporaneous COVID swab which was quite upsetting to him.  Unclear if the adrenergic surge from the swab or still in the setting of vasospasm provoked recurrent frequent episodes of fast NSVT (~170), not captured on ECG.  He was given IV magnesium and started on lidocaine infusion at that point.  Ventricular ectopy then abated and he was chest pain free at time of admission.  He reported that he didn't use "much cocaine" and regretted the choice.  He was suppose to be on multiple antihypertensives but had not taken them in a week as he ran out. When he does not use cocaine he doesn't have shortness of breath or chest pain to speak of.  He had a similar presentation in October 2019 following an admission in July during which he underwent a coronary angiogram which showed mild/moderate eccentric disease of mid/distal LAD, patent LCx, moderate ostial disease of LCx, and mid RCA w/ 30% stenosis.  He was discharged on vasodilators and has done well with rare relapses. He was admitted to cardiology for further management.   Hospital Course     He was admitted and observed. hsTn peaked at 5418. IV lidocaine was stopped and his rhythm remained stable. It was felt his arrhythmia and EKG changes  were related to his cocaine use. No further episodes of chest pain during admission. Follow up echo showed EF of 45-50% with septal and inferior wall hypokinesis, mild LVH. He was weaned from IV nitro and able to ambulate without chest pain. Counseled on the need to abstain from cocaine use in the future which he voiced understanding.   General: Well developed, well nourished,  male appearing in no acute distress. Head: Normocephalic, atraumatic.  Neck: Supple without bruits, JVD. Lungs:  Resp regular and unlabored, CTA. Heart: RRR, S1, S2, no S3, S4, or murmur; no rub. Abdomen: Soft, non-tender, non-distended with normoactive bowel sounds. No hepatomegaly. No rebound/guarding. No obvious abdominal masses. Extremities: No clubbing, cyanosis, edema. Distal pedal pulses are 2+ bilaterally. Neuro: Alert and oriented X 3. Moves all extremities spontaneously. Psych: Normal affect.   Did the patient have an acute coronary syndrome (MI, NSTEMI, STEMI, etc) this admission?:  No.   The elevated Troponin was due to the acute medical illness (demand ischemia).      _____________  Discharge Vitals Blood pressure 126/83, pulse 76, temperature 97.8 F (36.6 C), temperature source Oral, resp. rate 16, height 5\' 10"  (1.778 m), weight 69.4 kg, SpO2 94 %.  Filed Weights   06/26/20 0158 06/27/20 0415  Weight: 72.6 kg 69.4 kg    Labs & Radiologic Studies    CBC Recent Labs    06/26/20 0156 06/27/20 0242  WBC 9.0 8.4  HGB 16.8 16.8  HCT 51.6 49.8  MCV 94.0 93.8  PLT 334 318   Basic Metabolic Panel Recent Labs    06/29/20 0156 06/26/20 0424 06/27/20 0242  NA 137  --  140  K 4.2  --  4.2  CL 102  --  106  CO2 26  --  25  GLUCOSE 128*  --  115*  BUN 10  --  18  CREATININE 1.17  --  1.40*  CALCIUM 8.5*  --  8.6*  MG  --  2.2  --    Liver Function Tests No results for input(s): AST, ALT, ALKPHOS, BILITOT, PROT, ALBUMIN in the last 72 hours. No results for input(s): LIPASE, AMYLASE in the last 72 hours. High Sensitivity Troponin:   Recent Labs  Lab 06/16/20 1036 06/26/20 0156 06/26/20 0424  TROPONINIHS 13 34* 5,418*    BNP Invalid input(s): POCBNP D-Dimer No results for input(s): DDIMER in the last 72 hours. Hemoglobin A1C No results for input(s): HGBA1C in the last 72 hours. Fasting Lipid Panel Recent Labs    06/27/20 0242  CHOL 156  HDL 50   LDLCALC 83  TRIG 06/29/20  CHOLHDL 3.1   Thyroid Function Tests Recent Labs    06/26/20 1034  TSH 3.270   _____________  DG Chest 2 View  Result Date: 06/16/2020 CLINICAL DATA:  Chest pain EXAM: CHEST - 2 VIEW COMPARISON:  03/18/2018 chest radiograph. FINDINGS: Stable cardiomediastinal silhouette with normal heart size. No pneumothorax. No pleural effusion. Lungs appear clear, with no acute consolidative airspace disease and no pulmonary edema. IMPRESSION: No active cardiopulmonary disease. Electronically Signed   By: 05/18/2018 M.D.   On: 06/16/2020 10:50   DG Chest Portable 1 View  Result Date: 06/26/2020 CLINICAL DATA:  Chest pain EXAM: PORTABLE CHEST 1 VIEW COMPARISON:  06/16/2020 FINDINGS: The heart size and mediastinal contours are within normal limits. Both lungs are clear. The visualized skeletal structures are unremarkable. IMPRESSION: No active disease. Electronically Signed   By: 08/14/2020 M.D.   On: 06/26/2020 02:28  ECHOCARDIOGRAM COMPLETE  Result Date: 06/26/2020    ECHOCARDIOGRAM REPORT   Patient Name:   Bryce Ortega Date of Exam: 06/26/2020 Medical Rec #:  [id number]        Height:       70.0 in Accession #:    [id number]       Weight:       160.1 lb Date of Birth:  11/25/1960        BSA:          1.899 m Patient Age:    59 years         BP:           134/99 mmHg Patient Gender: M                HR:           62 bpm. Exam Location:  Inpatient Procedure: 2D Echo, Cardiac Doppler and Color Doppler Indications:    I42.9 Cardiomyopathy (unspecified)  History:        Patient has prior history of Echocardiogram examinations, most                 recent 12/27/2017. Previous Myocardial Infarction; Risk                 Factors:Hypertension and Dyslipidemia. Cocaine abuse.  Sonographer:    Elmarie Shiley Dance Referring Phys: [id number] CARRIEL T NIPP  Sonographer Comments: Suboptimal subcostal window. IMPRESSIONS  1. Septal and inferior wall hypokinesis . Left ventricular ejection fraction, by  estimation, is 45 to 50%. The left ventricle has mildly decreased function. The left ventricle has no regional wall motion abnormalities. There is mild asymmetric left ventricular hypertrophy of the basal and septal segments. Left ventricular diastolic parameters were normal.  2. Right ventricular systolic function is normal. The right ventricular size is normal.  3. The mitral valve is normal in structure. Trivial mitral valve regurgitation. No evidence of mitral stenosis.  4. The aortic valve is normal in structure. Aortic valve regurgitation is not visualized. No aortic stenosis is present.  5. The inferior vena cava is normal in size with greater than 50% respiratory variability, suggesting right atrial pressure of 3 mmHg. FINDINGS  Left Ventricle: Septal and inferior wall hypokinesis. Left ventricular ejection fraction, by estimation, is 45 to 50%. The left ventricle has mildly decreased function. The left ventricle has no regional wall motion abnormalities. The left ventricular internal cavity size was normal in size. There is mild asymmetric left ventricular hypertrophy of the basal and septal segments. Left ventricular diastolic parameters were normal. Right Ventricle: The right ventricular size is normal. No increase in right ventricular wall thickness. Right ventricular systolic function is normal. Left Atrium: Left atrial size was normal in size. Right Atrium: Right atrial size was normal in size. Pericardium: There is no evidence of pericardial effusion. Mitral Valve: The mitral valve is normal in structure. Trivial mitral valve regurgitation. No evidence of mitral valve stenosis. Tricuspid Valve: The tricuspid valve is normal in structure. Tricuspid valve regurgitation is not demonstrated. No evidence of tricuspid stenosis. Aortic Valve: The aortic valve is normal in structure. Aortic valve regurgitation is not visualized. No aortic stenosis is present. Pulmonic Valve: The pulmonic valve was normal in  structure. Pulmonic valve regurgitation is not visualized. No evidence of pulmonic stenosis. Aorta: The aortic root is normal in size and structure. Venous: The inferior vena cava is normal in size with greater than 50% respiratory variability, suggesting right atrial pressure of 3 mmHg.  IAS/Shunts: No atrial level shunt detected by color flow Doppler.  LEFT VENTRICLE PLAX 2D LVIDd:         4.10 cm  Diastology LVIDs:         3.20 cm  LV e' medial:    6.20 cm/s LV PW:         1.20 cm  LV E/e' medial:  8.7 LV IVS:        1.00 cm  LV e' lateral:   7.29 cm/s LVOT diam:     2.20 cm  LV E/e' lateral: 7.4 LV SV:         58 LV SV Index:   31 LVOT Area:     3.80 cm  RIGHT VENTRICLE            IVC RV Basal diam:  2.70 cm    IVC diam: 1.70 cm RV S prime:     9.14 cm/s TAPSE (M-mode): 1.8 cm LEFT ATRIUM             Index       RIGHT ATRIUM           Index LA diam:        3.40 cm 1.79 cm/m  RA Area:     14.30 cm LA Vol (A2C):   46.5 ml 24.49 ml/m RA Volume:   32.40 ml  17.06 ml/m LA Vol (A4C):   36.4 ml 19.17 ml/m LA Biplane Vol: 43.6 ml 22.96 ml/m  AORTIC VALVE LVOT Vmax:   77.30 cm/s LVOT Vmean:  47.000 cm/s LVOT VTI:    0.153 m  AORTA Ao Root diam: 3.70 cm Ao Asc diam:  3.70 cm MITRAL VALVE MV Area (PHT): 2.76 cm    SHUNTS MV Decel Time: 275 msec    Systemic VTI:  0.15 m MV E velocity: 54.10 cm/s  Systemic Diam: 2.20 cm MV A velocity: 67.70 cm/s MV E/A ratio:  0.80 Charlton HawsPeter Nishan MD Electronically signed by Charlton HawsPeter Nishan MD Signature Date/Time: 06/26/2020/10:06:08 AM    Final    Disposition   Pt is being discharged home today in good condition.  Follow-up Plans & Appointments     Follow-up Information    NowataBhagat, Sharrell KuBhavinkumar, [place] Follow up on 07/10/2020.   Specialty: Cardiology Why: at 2:15pm for your follow up appt. Contact information: [address] [place] [address] [phone]              Discharge Instructions    Diet - low sodium heart healthy   Complete by: As directed     Increase activity slowly   Complete by: As directed       Discharge Medications   Allergies as of 06/27/2020      Reactions   Propoxyphene N-acetaminophen Nausea Only   REACTION: GI upset      Medication List    TAKE these medications   amLODipine 5 MG tablet Commonly known as: NORVASC Take 1 tablet (5 mg total) by mouth daily.   aspirin EC 81 MG tablet Take 1 tablet (81 mg total) by mouth daily.   atorvastatin 40 MG tablet Commonly known as: LIPITOR Take 1 tablet (40 mg total) by mouth daily.   isosorbide mononitrate 30 MG 24 hr tablet Commonly known as: IMDUR Take 1 tablet (30 mg total) by mouth daily.   losartan 25 MG tablet Commonly known as: COZAAR Take 1 tablet (25 mg total) by mouth daily.   nitroGLYCERIN 0.4 MG SL tablet Commonly known  as: Nitrostat Place 1 tablet (0.4 mg total) under the tongue every 5 (five) minutes as needed for chest pain (for 3 doses).          Outstanding Labs/Studies   N/a   Duration of Discharge Encounter   Greater than 30 minutes including physician time.  Signed, Laverda Page, NP 06/27/2020, 9:51 AM   I have examined the patient and reviewed assessment and plan and discussed with patient.  Agree with above as stated.    No chest pain.  Tele stable off of lidocaine gtt.   I stressed the importance of avoiding illegal drugs.  Medical therapy for HTN and lipids.  Mild CAD noted in the past.  Healthy plant based diet will be helpful.    He has a roommate that he lives with, who is in good health.    Lance Muss

## 2020-06-27 NOTE — Care Management (Signed)
1156 06-27-20 Patient is aware that medications have been sent to the [facility] Department. Patient will schedule an appointment with Bartholome Bill and will see her when he leaves the hospital today. Case Manager provided the patient with a bus pass for transportation. No further needs from Case Manager at this time. Gala Lewandowsky, RN,BSN Case Manager

## 2020-07-10 ENCOUNTER — Ambulatory Visit: Payer: Self-pay | Admitting: Physician Assistant

## 2020-07-10 NOTE — Progress Notes (Deleted)
Cardiology Office Note:    Date:  07/10/2020   ID:  Bryce Ortega, DOB 1960/11/26, MRN [id number]  PCP:  Lavinia Sharps, NP  [facility] Ortega Cardiologist:  Lesleigh Noe, MD  [facility] Ortega Electrophysiologist:  None   Chief Complaint:   History of Present Illness:    Bryce Ortega is a 60 y.o. male with a hx of ***  Past Medical History:  Diagnosis Date  . Cocaine abuse (HCC)   . Complication of anesthesia   . H/O ventricular fibrillation 2016   s/p cocaine use  . High cholesterol   . Hypertension   . MI (mitral incompetence)   . Myocardial infarction (HCC) 2016; ?06/2017   "both related to cocaine" (03/18/2018)  . Nonobstructive atherosclerosis of coronary artery 2016   LHC showed normal EF and mild non-obstructive CAD    Past Surgical History:  Procedure Laterality Date  . CARDIAC CATHETERIZATION N/A 05/10/2015   Procedure: Left Heart Cath and Coronary Angiography;  Surgeon: Dolores Patty, MD;  Location: [facility] INVASIVE CV LAB;  Service: Cardiovascular;  Laterality: N/A;  . LEFT HEART CATH AND CORONARY ANGIOGRAPHY N/A 12/27/2017   Procedure: LEFT HEART CATH AND CORONARY ANGIOGRAPHY;  Surgeon: Lyn Records, MD;  Location: [facility] INVASIVE CV LAB;  Service: Cardiovascular;  Laterality: N/A;  . TONSILLECTOMY      Current Medications: No outpatient medications have been marked as taking for the 07/10/20 encounter (Appointment) with Manson Passey, PA.     Allergies:   Propoxyphene n-acetaminophen   Social History   Socioeconomic History  . Marital status: Divorced    Spouse name: Not on file  . Number of children: Not on file  . Years of education: Not on file  . Highest education level: Not on file  Occupational History  . Not on file  Tobacco Use  . Smoking status: Never Smoker  . Smokeless tobacco: Never Used  Vaping Use  . Vaping Use: Never used  Substance and Sexual Activity  . Alcohol use: Yes    Alcohol/week: 13.0 standard drinks     Types: 13 Cans of beer per week    Comment: 10/4//2019 "~ 2 40 oz beers 2x/week"  . Drug use: Yes    Types: Cocaine    Comment: 10/4//2019 ""maybe once/month"  . Sexual activity: Yes  Other Topics Concern  . Not on file  Social History Narrative   ** Merged History Encounter **       Social Determinants of Health   Financial Resource Strain: Not on file  Food Insecurity: Not on file  Transportation Needs: Not on file  Physical Activity: Not on file  Stress: Not on file  Social Connections: Not on file     Family History: The patient's family history includes CAD in his father; Heart disease in his maternal grandfather and mother; Hypertension in his father.  ***  ROS:   Please see the history of present illness.    All other systems reviewed and are negative. ***  EKGs/Labs/Other Studies Reviewed:    The following studies were reviewed today: ***  EKG:  EKG is *** ordered today.  The ekg ordered today demonstrates ***  Recent Labs: 09/26/2019: ALT 72 06/26/2020: B Natriuretic Peptide 97.7; Magnesium 2.2; TSH 3.270 06/27/2020: BUN 18; Creatinine, Ser 1.40; Hemoglobin 16.8; Platelets 318; Potassium 4.2; Sodium 140  Recent Lipid Panel    Component Value Date/Time   CHOL 156 06/27/2020 0242   TRIG 113 06/27/2020 0242  HDL 50 06/27/2020 0242   CHOLHDL 3.1 06/27/2020 0242   VLDL 23 06/27/2020 0242   LDLCALC 83 06/27/2020 0242     Risk Assessment/Calculations:   {Does this patient have ATRIAL FIBRILLATION?:[phone]}   Physical Exam:    VS:  There were no vitals taken for this visit.    Wt Readings from Last 3 Encounters:  06/27/20 153 lb (69.4 kg)  06/16/20 160 lb (72.6 kg)  09/26/19 165 lb (74.8 kg)     GEN: *** Well nourished, well developed in no acute distress HEENT: Normal NECK: No JVD; No carotid bruits LYMPHATICS: No lymphadenopathy CARDIAC: ***RRR, no murmurs, rubs, gallops RESPIRATORY:  Clear to auscultation without rales, wheezing or rhonchi   ABDOMEN: Soft, non-tender, non-distended MUSCULOSKELETAL:  No edema; No deformity  SKIN: Warm and dry NEUROLOGIC:  Alert and oriented x 3 PSYCHIATRIC:  Normal affect   ASSESSMENT AND PLAN:    1. ***  2. ***  Medication Adjustments/Labs and Tests Ordered: Current medicines are reviewed at length with the patient today.  Concerns regarding medicines are outlined above.  No orders of the defined types were placed in this encounter.  No orders of the defined types were placed in this encounter.   There are no Patient Instructions on file for this visit.   Lorelei Pont, [place]  07/10/2020 10:41 AM    Bryce Ortega

## 2020-08-25 ENCOUNTER — Encounter (HOSPITAL_COMMUNITY): Payer: Self-pay | Admitting: Emergency Medicine

## 2020-08-25 ENCOUNTER — Observation Stay (HOSPITAL_COMMUNITY)
Admission: EM | Admit: 2020-08-25 | Discharge: 2020-08-26 | Disposition: A | Discharge status: Home / Self Care | Payer: Self-pay | Attending: Family Medicine | Admitting: Family Medicine

## 2020-08-25 ENCOUNTER — Other Ambulatory Visit: Payer: Self-pay

## 2020-08-25 ENCOUNTER — Emergency Department (HOSPITAL_COMMUNITY): Payer: Self-pay

## 2020-08-25 DIAGNOSIS — Z79899 Other long term (current) drug therapy: Secondary | ICD-10-CM | POA: Insufficient documentation

## 2020-08-25 DIAGNOSIS — I10 Essential (primary) hypertension: Secondary | ICD-10-CM | POA: Insufficient documentation

## 2020-08-25 DIAGNOSIS — R079 Chest pain, unspecified: Secondary | ICD-10-CM

## 2020-08-25 DIAGNOSIS — R072 Precordial pain: Principal | ICD-10-CM

## 2020-08-25 DIAGNOSIS — I2510 Atherosclerotic heart disease of native coronary artery without angina pectoris: Secondary | ICD-10-CM | POA: Insufficient documentation

## 2020-08-25 DIAGNOSIS — Z20822 Contact with and (suspected) exposure to covid-19: Secondary | ICD-10-CM | POA: Insufficient documentation

## 2020-08-25 DIAGNOSIS — F1410 Cocaine abuse, uncomplicated: Secondary | ICD-10-CM

## 2020-08-25 DIAGNOSIS — Z7982 Long term (current) use of aspirin: Secondary | ICD-10-CM | POA: Insufficient documentation

## 2020-08-25 DIAGNOSIS — F1490 Cocaine use, unspecified, uncomplicated: Secondary | ICD-10-CM

## 2020-08-25 DIAGNOSIS — R778 Other specified abnormalities of plasma proteins: Secondary | ICD-10-CM

## 2020-08-25 LAB — MAGNESIUM: Magnesium: 2.4 mg/dL (ref 1.7–2.4)

## 2020-08-25 LAB — BASIC METABOLIC PANEL
Anion gap: 8 (ref 5–15)
BUN: 14 mg/dL (ref 6–20)
CO2: 23 mmol/L (ref 22–32)
Calcium: 9.1 mg/dL (ref 8.9–10.3)
Chloride: 105 mmol/L (ref 98–111)
Creatinine, Ser: 1.11 mg/dL (ref 0.61–1.24)
GFR, Estimated: >60 mL/min (ref >60)
Glucose, Bld: 106 mg/dL — ABNORMAL HIGH (ref 70–99)
Potassium: 4.9 mmol/L (ref 3.5–5.1)
Sodium: 136 mmol/L (ref 135–145)

## 2020-08-25 LAB — CBC
HCT: 48.2 % (ref 39.0–52.0)
Hemoglobin: 16.1 g/dL (ref 13.0–17.0)
MCH: 31.8 pg (ref 26.0–34.0)
MCHC: 33.4 g/dL (ref 30.0–36.0)
MCV: 95.3 fL (ref 80.0–100.0)
Platelets: 322 10*3/uL (ref 150–400)
RBC: 5.06 MIL/uL (ref 4.22–5.81)
RDW: 13.9 % (ref 11.5–15.5)
WBC: 8 10*3/uL (ref 4.0–10.5)
nRBC: 0 % (ref 0.0–0.2)

## 2020-08-25 LAB — RESP PANEL BY RT-PCR (FLU A&B, COVID) ARPGX2
Influenza A by PCR: NEGATIVE
Influenza B by PCR: NEGATIVE
SARS Coronavirus 2 by RT PCR: NEGATIVE

## 2020-08-25 LAB — TROPONIN I (HIGH SENSITIVITY)
Troponin I (High Sensitivity): 24 ng/L — ABNORMAL HIGH (ref <18)
Troponin I (High Sensitivity): 96 ng/L — ABNORMAL HIGH (ref <18)
Troponin I (High Sensitivity): 95 ng/L — ABNORMAL HIGH (ref <18)

## 2020-08-25 LAB — ETHANOL: Alcohol, Ethyl (B): <10 mg/dL (ref <10)

## 2020-08-25 MED ORDER — ISOSORBIDE MONONITRATE ER 30 MG PO TB24
30.0000 mg | ORAL_TABLET | Freq: Every day | ORAL | Status: DC
  Administered 2020-08-25 – 2020-08-26 (×2): 30 mg via ORAL
  Filled 2020-08-25 (×2): qty 1

## 2020-08-25 MED ORDER — ATORVASTATIN CALCIUM 40 MG PO TABS
40.0000 mg | ORAL_TABLET | Freq: Every day | ORAL | Status: DC
  Administered 2020-08-25 – 2020-08-26 (×2): 40 mg via ORAL
  Filled 2020-08-25 (×2): qty 1

## 2020-08-25 MED ORDER — FOLIC ACID 1 MG PO TABS
1.0000 mg | ORAL_TABLET | Freq: Every day | ORAL | Status: DC
  Administered 2020-08-26: 1 mg via ORAL
  Filled 2020-08-25: qty 1

## 2020-08-25 MED ORDER — LOSARTAN POTASSIUM 25 MG PO TABS
25.0000 mg | ORAL_TABLET | Freq: Every day | ORAL | Status: DC
Start: 2020-08-25 — End: 2020-08-26
  Administered 2020-08-25 – 2020-08-26 (×2): 25 mg via ORAL
  Filled 2020-08-25 (×2): qty 1

## 2020-08-25 MED ORDER — THIAMINE HCL 100 MG PO TABS
100.0000 mg | ORAL_TABLET | Freq: Every day | ORAL | Status: DC
  Administered 2020-08-26: 100 mg via ORAL
  Filled 2020-08-25: qty 1

## 2020-08-25 MED ORDER — ENOXAPARIN SODIUM 40 MG/0.4ML ~~LOC~~ SOLN: 40.0000 mg | SUBCUTANEOUS | Status: DC

## 2020-08-25 MED ORDER — AMLODIPINE BESYLATE 5 MG PO TABS
5.0000 mg | ORAL_TABLET | Freq: Every day | ORAL | Status: DC
  Administered 2020-08-25 – 2020-08-26 (×2): 5 mg via ORAL
  Filled 2020-08-25 (×2): qty 1

## 2020-08-25 MED ORDER — ASPIRIN EC 81 MG PO TBEC
81.0000 mg | DELAYED_RELEASE_TABLET | Freq: Every day | ORAL | Status: DC
Start: 2020-08-25 — End: 2020-08-26
  Administered 2020-08-26: 81 mg via ORAL
  Filled 2020-08-25: qty 1

## 2020-08-25 MED ORDER — NITROGLYCERIN 0.4 MG SL SUBL: 0.4000 mg | SUBLINGUAL_TABLET | SUBLINGUAL | Status: DC | PRN

## 2020-08-25 NOTE — ED Notes (Addendum)
Troponin 96  Reported to provider and Vinetta Bergamo

## 2020-08-25 NOTE — H&P (Addendum)
Family Medicine Teaching [facility] Admission History and Physical Service Pager: [phone]  Patient name: Bryce Ortega Medical record number: [id number] Date of birth: 06/01/1961 Age: 60 y.o. Gender: male  Primary Care Provider: Lavinia Sharps, NP Consultants: Cardiology (consulted in ED) Code Status: Full Preferred Emergency Contact: Jyl Heinz, brother  Chief Complaint: Chest pain  Assessment and Plan: DYWANE PERUSKI is a 60 y.o. male presenting with chest pain since 4AM. PMH is significant for VF arrest in setting of cocaine use, cocaine induced NSTEMI, non-obstructive CAD (seen on cardiac cath 2019), HTN, cocaine and alcohol abuse.   Cocaine-induced chest pain  Hx NSTEMI and ventricular arrhythmias related to cocaine Brought in to ED by EMS. Given 324 mg ASA and nitro x2 by EMS with improvement. In ED, EKG sinus rhythm at 76 bpm without ST elevation. Troponin 24>96>95. Patient endorses cocaine (<1 gram) and alcohol use (40 oz beer) in the last past 24 hours. He has had multiple visits in the past for similar symptoms that have been attributed to cocaine use. ED provider discussed with patients cardiologist who recommended admission overnight for observation and no interventions necessary at this time. Of note, patient with recent admission in January of this year for similar symptoms after cocaine use. Troponin at that time elevated to 5,418 and attributed to demand ischemia.  Echo at that time showed EF 45-50% with septal and inferior wall hypokinesis, mild LVH.  Previous cocaine induced NSTEMI in 7/19. LHC performed 7/19 with widely patent coronary arteries, LAD with 30-40% narrowing, circumflex widely patent. NSTEMI affecting anterolateral wall, thought to be possibly related to coronary vasospasm in the setting of cocaine use vs. Takotsubu stress cardiomyopathy. Patient instructed to follow up with Cardiology after hospitalizations but did not. Will observe patient overnight  and restart home medications. CSW to provide substance abuse resources as patient does endorse desire to stop alcohol and cocaine.  -Admit to FPTS med-tele, attending Dr. Pollie Meyer -Vitals per floor protocol -Continuous cardiac monitoring -Continue home Imdur 30 mg daily  -ASA 81 mg daily  -Avoid beta blockers  -CSW for substance abuse counseling/resources -PT/OT eval  -Heart healthy diet  -Lovenox DVT ppx -Up with assistance  HTN: chronic, stable Hypertensive to 160's systolic. Most recent BP 156/102. Home medications include Amlodipine, Losartan and Imdur. Patient states that he has not taken his BP medication for the last 3 days as he ran out and did not go to his PCP for refill.  -Vitals per floor protocol -Continue Amlodipine 5 mg daily  -Continue Losartan 25 mg daily  -Continue Imdur 30 mg daily  -Will send patient with medications on d/c  HLD: chronic, stable Recent lipid panel 06/27/20 that was WNL with LDL 83, cholesterol 156. Home medication includes Atorvastatin. -Continue Atorvastatin 40 mg daily   Alcohol Abuse  Cocaine Use Disorder Reports drinking 40 oz every 2-3 days. Ethanol level <10 in ED. Cocaine use about every 2-3 weeks, last used yesterday (<1 gram).  -Encourage cessation -CIWA  -Thiamine and folic acid daily   FEN/GI: Heart healthy diet Prophylaxis: Lovenox   Disposition: med-tele  History of Present Illness:  Bryce Ortega is a 60 y.o. male presenting with chest pain x 1 day. Patient awoke around 4:30-5:00 AM with chest pain and pressure and called EMS because it felt like the last time he had CP when he had a STEMI 6 months ago. Patient also had 40 oz of malt liquor yesterday, reports that he has about 2 of these per  week. He has never had withdrawal from EtOH, denies seizures and hallucinations. Says that he used cocaine last night, snorts it, <1 gram. He uses this about 2x per month. Last usage 2 weeks ago. He smokes cigarettes, declines nicotine  patch. He reports that he thinks he should stop doing cocaine, is less worried about alcohol. He knows his medication regimen very well, says that he has been out of imdur, losartan, amlodipine bc pharmacy was closed and he couldn't get there on Friday prior to closing. Denies fever, nausea, vomiting, chills, no longer having chest pain and SOB after nitroglycerin.  ED Course: Vitals notable for hypertension to 160's systolic. Labs significant for EtOH <10, Trop 24>96. CXR unremarkable. ED provider discussed with patients cardiologist who recommended admission for observation, no interventions needed at this time. FPTS consulted for unassigned admission.   Review Of Systems: Per HPI with the following additions:   Review of Systems  Constitutional: Negative for chills and fever.  Respiratory: Positive for chest tightness. Negative for cough and shortness of breath.   Cardiovascular: Positive for chest pain. Negative for palpitations.  Gastrointestinal: Negative for abdominal distention, abdominal pain, constipation, diarrhea, nausea, rectal pain and vomiting.  Genitourinary: Negative for difficulty urinating and dysuria.  Neurological: Negative for dizziness, speech difficulty, weakness, light-headedness, numbness and headaches.  Psychiatric/Behavioral: Negative for confusion.  All other systems reviewed and are negative.    Patient Active Problem List   Diagnosis Date Noted  . AKI (acute kidney injury) (HCC) 07/07/2017  . Ischemia, myocardial, acute (HCC) 07/06/2017  . Elevated troponin   . Cocaine adverse reaction 02/23/2017  . Chest pain 02/22/2017  . Mild CAD 02/22/2017  . NSTEMI (non-ST elevated myocardial infarction) (HCC) 05/15/2015  . Cocaine abuse (HCC) 05/15/2015  . Chest pain at rest   . Cardiac arrest (HCC) 05/10/2015  . Hyperlipidemia 05/10/2015  . Essential hypertension 01/10/2009  . SHOULDER PAIN, RIGHT 01/10/2009  . CERVICALGIA 01/10/2009  . LOW BACK PAIN 01/10/2009     Past Medical History: Past Medical History:  Diagnosis Date  . Cocaine abuse (HCC)   . Complication of anesthesia   . H/O ventricular fibrillation 2016   s/p cocaine use  . High cholesterol   . Hypertension   . MI (mitral incompetence)   . Myocardial infarction (HCC) 2016; ?06/2017   "both related to cocaine" (03/18/2018)  . Nonobstructive atherosclerosis of coronary artery 2016   LHC showed normal EF and mild non-obstructive CAD    Past Surgical History: Past Surgical History:  Procedure Laterality Date  . CARDIAC CATHETERIZATION N/A 05/10/2015   Procedure: Left Heart Cath and Coronary Angiography;  Surgeon: Dolores Patty, MD;  Location: [facility] INVASIVE CV LAB;  Service: Cardiovascular;  Laterality: N/A;  . LEFT HEART CATH AND CORONARY ANGIOGRAPHY N/A 12/27/2017   Procedure: LEFT HEART CATH AND CORONARY ANGIOGRAPHY;  Surgeon: Lyn Records, MD;  Location: [facility] INVASIVE CV LAB;  Service: Cardiovascular;  Laterality: N/A;  . TONSILLECTOMY      Social History: Social History   Tobacco Use  . Smoking status: Never Smoker  . Smokeless tobacco: Never Used  Vaping Use  . Vaping Use: Never used  Substance Use Topics  . Alcohol use: Yes    Alcohol/week: 13.0 standard drinks    Types: 13 Cans of beer per week    Comment: 10/4//2019 "~ 2 40 oz beers 2x/week"  . Drug use: Yes    Types: Cocaine    Comment: 10/4//2019 ""maybe once/month"   Additional social history:  goes to [facility] Please also refer to relevant sections of EMR.  Family History: Family History  Problem Relation Age of Onset  . Hypertension Father   . CAD Father        s/p CABG  . Heart disease Mother   . Heart disease Maternal Grandfather    Allergies and Medications: Allergies  Allergen Reactions  . Propoxyphene N-Acetaminophen Nausea Only    REACTION: GI upset   No current facility-administered medications on file prior to encounter.   Current Outpatient Medications on File Prior to  Encounter  Medication Sig Dispense Refill  . amLODipine (NORVASC) 5 MG tablet Take 1 tablet (5 mg total) by mouth daily. 30 tablet 0  . aspirin EC 81 MG tablet Take 1 tablet (81 mg total) by mouth daily. 30 tablet 0  . atorvastatin (LIPITOR) 40 MG tablet Take 1 tablet (40 mg total) by mouth daily. 90 tablet 0  . isosorbide mononitrate (IMDUR) 30 MG 24 hr tablet Take 1 tablet (30 mg total) by mouth daily. 30 tablet 0  . losartan (COZAAR) 25 MG tablet Take 1 tablet (25 mg total) by mouth daily. 30 tablet 0  . nitroGLYCERIN (NITROSTAT) 0.4 MG SL tablet Place 1 tablet (0.4 mg total) under the tongue every 5 (five) minutes as needed for chest pain (for 3 doses). 30 tablet 0    Objective: BP (!) 168/85   Pulse 73   Temp 97.8 F (36.6 C) (Oral)   Resp 20   Ht 5\' 10"  (1.778 m)   Wt 70 kg   SpO2 99%   BMI 22.14 kg/m  Exam: General: Awake, drowsy appearing, appears older than stated age, WM, NAD, thin Eyes: Non-icteric  Neck: Supple Cardiovascular: RRR without murmur, 2+ radial, DP and PT pulses b/l Respiratory: CTAB without wheezing/rhonchi/rales Gastrointestinal: Soft, non-distended, non-tender in all quadrants, no rebound or guarding, no hepatosplenomegaly MSK: no chest wall tenderness, moves all extremities spontaneously, without obvious deformites Derm: warm and dry Neuro: no focal deficits  Psych: Flat affect  Labs and Imaging: CBC BMET  Recent Labs  Lab 08/25/20 0924  WBC 8.0  HGB 16.1  HCT 48.2  PLT 322   Recent Labs  Lab 08/25/20 0924  NA 136  K 4.9  CL 105  CO2 23  BUN 14  CREATININE 1.11  GLUCOSE 106*  CALCIUM 9.1     EKG: Normal sinus rate 76 bpm, without ST elevations  DG Chest 2 View  Result Date: 08/25/2020 CLINICAL DATA:  Chest pain and shortness of breath EXAM: CHEST - 2 VIEW COMPARISON:  June 26, 2020 FINDINGS: Lungs are clear. Heart size and pulmonary vascularity are normal. No adenopathy. No pneumothorax. No bone lesions. IMPRESSION: Lungs  clear.  Cardiac silhouette normal. Electronically Signed   By: June 28, 2020 III M.D.   On: 08/25/2020 09:50     08/27/2020, DO 08/25/2020, 1:41 PM PGY-1, [facility] FPTS Intern pager: [phone], text pages welcome  FPTS Upper-Level Resident Addendum   I have independently interviewed and examined the patient. I have discussed the above with the original author and agree with their documentation. My edits for correction/addition/clarification are in pink. Please see also any attending notes.   [phone], M.D. PGY-2, [facility] 08/25/2020 4:47 PM  FPTS Service pager: [phone] (text pages welcome through [facility])

## 2020-08-25 NOTE — Discharge Summary (Signed)
Family Medicine Teaching [facility] Discharge Summary  Patient name: Bryce Ortega Medical record number: [id number] Date of birth: Sep 19, 1960 Age: 60 y.o. Gender: male Date of Admission: 08/25/2020  Date of Discharge: 08/26/20 Admitting Physician: Latrelle Dodrill, MD  Primary Care Provider: Lavinia Sharps, NP Consultants: Cardiology (consulted in ED)  Indication for Hospitalization: Chest pain  Discharge Diagnoses/Problem List:  Cocaine-induced chest pain HTN Cocaine misuse Alcohol misuse   Disposition: Home  Discharge Condition: Stable  Discharge Exam:  Blood pressure 123/86, pulse 68, temperature 97.7 F (36.5 C), temperature source Oral, resp. rate 15, height 5\' 10"  (1.778 m), weight 70 kg, SpO2 94 %. Gen: awake, alert, up and eating breakfast and in no distress Cardio: RRR without murmur Resp: CTAB without wheezing/rhonchi/rales Abd: soft, non-tender in all quadrants, no rebound or guarding Ext: without edema, 2+ radial and DP pulses   Brief Hospital Course:  Bryce Ortega is a 60 y.o. male who presented with chest pain x 1. PMH is significant for VF arrest in setting of cocaine use, cocaine induced NSTEMI, non-obstructive CAD (seen on cardiac cath 2019), HTN, cocaine and alcohol abuse.   Cocaine-induced Chest pain Patient received ASA 324 mg x1 and Nitro x2 via EMS. In ED, EKG significant for sinus rhythm 76 bpm without ST elevation. Troponin trended flat 24>96>95. CBC and BMP unremarkable. ED provider discussed with cardiologist who recommended observation overnight.  No acute interventions necessary.  Patient was continued on home medications during hospitalization. Chest pain improved on arrival to ED, after Nitro by EMS. No recurrence of pain. Patient was discharged with instructions to follow up with Cardiology and counseled extensively on cocaine cessation. CSW provided resources for substance abuse.   Hypertension Hypertensive to 160's systolic in ED.  Patient reported having not taken his BP medications for the last 3 days prior to admission due to running out. He was started back on his BP medication of amlodipine, losartan, and imdur during hospitalization. He was discharged with refills.   Health Maintenance Hgb A1c 5.5%. Patient is due for colonoscopy, has never had one.   All other chronic conditions stable.  Follow up recommendations. 1. Referral to GI for screening colonoscopy; patient reports never having one 2. Continue to encourage alcohol and cocaine cessation; provide resources      Significant Procedures: None  Significant Labs and Imaging:  Recent Labs  Lab 08/25/20 0924  WBC 8.0  HGB 16.1  HCT 48.2  PLT 322   Recent Labs  Lab 08/25/20 0924  NA 136  K 4.9  CL 105  CO2 23  GLUCOSE 106*  BUN 14  CREATININE 1.11  CALCIUM 9.1  MG 2.4    Results/Tests Pending at Time of Discharge: None  Discharge Medications:  Allergies as of 08/26/2020      Reactions   Propoxyphene N-acetaminophen Nausea Only   REACTION: GI upset      Medication List    TAKE these medications   amLODipine 5 MG tablet Commonly known as: NORVASC Take 1 tablet (5 mg total) by mouth daily.   aspirin EC 81 MG tablet Take 1 tablet (81 mg total) by mouth daily.   atorvastatin 40 MG tablet Commonly known as: LIPITOR Take 1 tablet (40 mg total) by mouth daily.   folic acid 1 MG tablet Commonly known as: FOLVITE Take 1 tablet (1 mg total) by mouth daily.   isosorbide mononitrate 30 MG 24 hr tablet Commonly known as: IMDUR Take 1 tablet (30 mg total) by  mouth daily.   losartan 25 MG tablet Commonly known as: Cozaar Take 1 tablet (25 mg total) by mouth daily.   nitroGLYCERIN 0.4 MG SL tablet Commonly known as: Nitrostat Place 1 tablet (0.4 mg total) under the tongue every 5 (five) minutes as needed for chest pain (for 3 doses).   thiamine 100 MG tablet Take 1 tablet (100 mg total) by mouth daily.       Discharge  Instructions: Please refer to Patient Instructions section of EMR for full details.  Patient was counseled important signs and symptoms that should prompt return to medical care, changes in medications, dietary instructions, activity restrictions, and follow up appointments.   Follow-Up Appointments:  Follow-up Information    Placey, [name], NP. Schedule an appointment as soon as possible for a visit.   Why: Please call your PCP to schedule a follow up.  Contact information: [address] [place] [address] [phone]        Lyn Records, MD .   Specialty: Cardiology Contact information: [phone] [address]. [address] [place] [address] [phone]               Sabino Dick, DO 08/26/2020, 1:53 PM PGY-1, [facility]

## 2020-08-25 NOTE — ED Triage Notes (Signed)
Patient BIB [facility] from home. Complaint of sudden onset left sided chest pain that radiates to left arm starting at 0630 this morning. Patient received 324 aspirin, nitro x2 via EMS with improvement. Patient endorses cocaine and alcohol use in the past 24 hours. Also reports not taking medications in a few days.

## 2020-08-25 NOTE — ED Provider Notes (Signed)
[facility] EMERGENCY DEPARTMENT Provider Note   CSN: [id number] Arrival date & time: 08/25/20  7846     History Chief Complaint  Patient presents with  . Chest Pain    Bryce Ortega is a 60 y.o. male.  Patient with history of hypertension, catheterization in 2019 with nonobstructive coronary artery disease, history of cocaine and alcohol abuse --presents to the emergency department for evaluation of chest pain.  Patient states that he was using cocaine and drank a 40 ounce beer yesterday around 5 PM.  Patient developed acute onset of chest pain with radiation to the left arm starting at 5:30 AM today.  EMS was called for transport after he could not find his nitroglycerin and pain persisted.  He was given 324 mg of aspirin and nitro x2 with improvement of the symptoms.  His pain is currently resolved.  He denies associated vomiting.  He states that he did break out into a sweat.  No abdominal pain.  No weakness, numbness, or tingling in the arms or the legs.        Past Medical History:  Diagnosis Date  . Cocaine abuse (HCC)   . Complication of anesthesia   . H/O ventricular fibrillation 2016   s/p cocaine use  . High cholesterol   . Hypertension   . MI (mitral incompetence)   . Myocardial infarction (HCC) 2016; ?06/2017   "both related to cocaine" (03/18/2018)  . Nonobstructive atherosclerosis of coronary artery 2016   LHC showed normal EF and mild non-obstructive CAD    Patient Active Problem List   Diagnosis Date Noted  . AKI (acute kidney injury) (HCC) 07/07/2017  . Ischemia, myocardial, acute (HCC) 07/06/2017  . Elevated troponin   . Cocaine adverse reaction 02/23/2017  . Chest pain 02/22/2017  . Mild CAD 02/22/2017  . NSTEMI (non-ST elevated myocardial infarction) (HCC) 05/15/2015  . Cocaine abuse (HCC) 05/15/2015  . Chest pain at rest   . Cardiac arrest (HCC) 05/10/2015  . Hyperlipidemia 05/10/2015  . Essential hypertension 01/10/2009  .  SHOULDER PAIN, RIGHT 01/10/2009  . CERVICALGIA 01/10/2009  . LOW BACK PAIN 01/10/2009    Past Surgical History:  Procedure Laterality Date  . CARDIAC CATHETERIZATION N/A 05/10/2015   Procedure: Left Heart Cath and Coronary Angiography;  Surgeon: Dolores Patty, MD;  Location: [facility] INVASIVE CV LAB;  Service: Cardiovascular;  Laterality: N/A;  . LEFT HEART CATH AND CORONARY ANGIOGRAPHY N/A 12/27/2017   Procedure: LEFT HEART CATH AND CORONARY ANGIOGRAPHY;  Surgeon: Lyn Records, MD;  Location: [facility] INVASIVE CV LAB;  Service: Cardiovascular;  Laterality: N/A;  . TONSILLECTOMY         Family History  Problem Relation Age of Onset  . Hypertension Father   . CAD Father        s/p CABG  . Heart disease Mother   . Heart disease Maternal Grandfather     Social History   Tobacco Use  . Smoking status: Never Smoker  . Smokeless tobacco: Never Used  Vaping Use  . Vaping Use: Never used  Substance Use Topics  . Alcohol use: Yes    Alcohol/week: 13.0 standard drinks    Types: 13 Cans of beer per week    Comment: 10/4//2019 "~ 2 40 oz beers 2x/week"  . Drug use: Yes    Types: Cocaine    Comment: 10/4//2019 ""maybe once/month"    Home Medications Prior to Admission medications   Medication Sig Start Date End Date Taking? Authorizing  Provider  amLODipine (NORVASC) 5 MG tablet Take 1 tablet (5 mg total) by mouth daily. 03/20/18   Hongalgi, Maximino Greenland, MD  aspirin EC 81 MG tablet Take 1 tablet (81 mg total) by mouth daily. 03/20/18   Hongalgi, Maximino Greenland, MD  atorvastatin (LIPITOR) 40 MG tablet Take 1 tablet (40 mg total) by mouth daily. 06/27/20   Arty Baumgartner, NP  isosorbide mononitrate (IMDUR) 30 MG 24 hr tablet Take 1 tablet (30 mg total) by mouth daily. 03/20/18   Hongalgi, Maximino Greenland, MD  losartan (COZAAR) 25 MG tablet Take 1 tablet (25 mg total) by mouth daily. 03/20/18   Hongalgi, Maximino Greenland, MD  nitroGLYCERIN (NITROSTAT) 0.4 MG SL tablet Place 1 tablet (0.4 mg total) under the tongue  every 5 (five) minutes as needed for chest pain (for 3 doses). 03/20/18   Hongalgi, Maximino Greenland, MD    Allergies    Propoxyphene n-acetaminophen  Review of Systems   Review of Systems  Constitutional: Positive for diaphoresis. Negative for fever.  Eyes: Negative for redness.  Respiratory: Negative for cough and shortness of breath.   Cardiovascular: Positive for chest pain. Negative for palpitations and leg swelling.  Gastrointestinal: Negative for abdominal pain, nausea and vomiting.  Genitourinary: Negative for dysuria.  Musculoskeletal: Negative for back pain and neck pain.  Skin: Negative for rash.  Neurological: Negative for syncope and light-headedness.  Psychiatric/Behavioral: The patient is not nervous/anxious.     Physical Exam Updated Vital Signs BP (!) 148/101 (BP Location: Right Arm)   Pulse 75   Temp 97.8 F (36.6 C) (Oral)   Resp (!) 26   Ht 5\' 10"  (1.778 m)   Wt 70 kg   SpO2 96%   BMI 22.14 kg/m   Physical Exam Vitals and nursing note reviewed.  Constitutional:      Appearance: He is well-developed. He is not diaphoretic.  HENT:     Head: Normocephalic and atraumatic.     Mouth/Throat:     Mouth: Mucous membranes are not dry.  Eyes:     Conjunctiva/sclera: Conjunctivae normal.  Neck:     Vascular: Normal carotid pulses. No carotid bruit or JVD.     Trachea: Trachea normal. No tracheal deviation.  Cardiovascular:     Rate and Rhythm: Normal rate and regular rhythm.     Pulses: No decreased pulses.     Heart sounds: Normal heart sounds, S1 normal and S2 normal. Heart sounds not distant. No murmur heard.   Pulmonary:     Effort: Pulmonary effort is normal. No respiratory distress.     Breath sounds: Normal breath sounds. No wheezing.  Chest:     Chest wall: No tenderness.  Abdominal:     General: Bowel sounds are normal.     Palpations: Abdomen is soft.     Tenderness: There is no abdominal tenderness. There is no guarding or rebound.   Musculoskeletal:     Cervical back: Normal range of motion and neck supple. No muscular tenderness.     Right lower leg: No tenderness. No edema.     Left lower leg: No tenderness. No edema.  Skin:    General: Skin is warm and dry.     Coloration: Skin is not pale.  Neurological:     Mental Status: He is alert.     ED Results / Procedures / Treatments   Labs (all labs ordered are listed, but only abnormal results are displayed) Labs Reviewed  BASIC METABOLIC PANEL - Abnormal;  Notable for the following components:      Result Value   Glucose, Bld 106 (*)    All other components within normal limits  TROPONIN I (HIGH SENSITIVITY) - Abnormal; Notable for the following components:   Troponin I (High Sensitivity) 24 (*)    All other components within normal limits  TROPONIN I (HIGH SENSITIVITY) - Abnormal; Notable for the following components:   Troponin I (High Sensitivity) 96 (*)    All other components within normal limits  RESP PANEL BY RT-PCR (FLU A&B, COVID) ARPGX2  CBC  MAGNESIUM  ETHANOL    EKG EKG Interpretation  Date/Time:  Sunday August 25 2020 09:21:19 EDT Ventricular Rate:  76 PR Interval:    QRS Duration: 78 QT Interval:  380 QTC Calculation: 428 R Axis:   54 Text Interpretation: Sinus rhythm No significant change since last tracing Confirmed by Linwood Dibbles [phone]) on 08/25/2020 9:26:18 AM   Radiology DG Chest 2 View  Result Date: 08/25/2020 CLINICAL DATA:  Chest pain and shortness of breath EXAM: CHEST - 2 VIEW COMPARISON:  June 26, 2020 FINDINGS: Lungs are clear. Heart size and pulmonary vascularity are normal. No adenopathy. No pneumothorax. No bone lesions. IMPRESSION: Lungs clear.  Cardiac silhouette normal. Electronically Signed   By: Bretta Bang III M.D.   On: 08/25/2020 09:50    Procedures Procedures   Medications Ordered in ED Medications  amLODipine (NORVASC) tablet 5 mg (has no administration in time range)  losartan (COZAAR) tablet  25 mg (has no administration in time range)  isosorbide mononitrate (IMDUR) 24 hr tablet 30 mg (has no administration in time range)  atorvastatin (LIPITOR) tablet 40 mg (has no administration in time range)    ED Course  I have reviewed the triage vital signs and the nursing notes.  Pertinent labs & imaging results that were available during my care of the patient were reviewed by me and considered in my medical decision making (see chart for details).  Patient seen and examined. EKG reviewed. History reviewed. 2019 cath reviewed. Symptoms typically thought to be related to vasospasm from cocaine use. Work-up initiated.   Vital signs reviewed and are as follows: BP (!) 148/101 (BP Location: Right Arm)   Pulse 75   Temp 97.8 F (36.6 C) (Oral)   Resp (!) 26   Ht 5\' 10"  (1.778 m)   Wt 70 kg   SpO2 96%   BMI 22.14 kg/m   Troponin 24 > 96. Discussed with Dr. .   Discussed history and findings today with Dr. Lynelle Doctor. No further work-up or testing from cardiology standpoint.  Advises patient to stop cocaine use.  Patient was rechecked.  He currently has 3 out of 10 chest tightness, which he declines treatment for.  He does request his home medications including Imdur.  Will order.  Patient is willing to be admitted for observation.  Discussed with family practice who will see patient.    MDM Rules/Calculators/A&P                          Admit for CP observation -- CP in setting cocaine use, elevated troponin.   Final Clinical Impression(s) / ED Diagnoses Final diagnoses:  Precordial chest pain  Elevated troponin    Rx / DC Orders ED Discharge Orders    None       Scharlene Gloss, PA-C 08/25/20 1352    08/27/20, MD 08/26/20 [id number]

## 2020-08-25 NOTE — Hospital Course (Addendum)
Bryce Ortega is a 60 y.o. male who presented with chest pain x 1. PMH is significant for VF arrest in setting of cocaine use, cocaine induced NSTEMI, non-obstructive CAD (seen on cardiac cath 2019), HTN, cocaine and alcohol abuse.   Cocaine-induced Chest pain Patient received ASA 324 mg x1 and Nitro x2 via EMS. In ED, EKG significant for sinus rhythm 76 bpm without ST elevation. Troponin trended flat 24>96>95. CBC and BMP unremarkable. ED provider discussed with cardiologist who recommended observation overnight.  No acute interventions necessary.  Patient was continued on home medications during hospitalization. Chest pain improved on arrival to ED, after Nitro by EMS. No recurrence of pain. Patient was discharged with instructions to follow up with Cardiology and counseled extensively on cocaine cessation. CSW provided resources for substance abuse.   Hypertension Hypertensive to 160's systolic in ED. Patient reported having not taken his BP medications for the last 3 days prior to admission due to running out. He was started back on his BP medication of amlodipine, losartan, and imdur during hospitalization. He was discharged with refills.   Health Maintenance Hgb A1c 5.5%. Patient is due for colonoscopy, has never had one.   All other chronic conditions stable.  Follow up recommendations. Referral to GI for screening colonoscopy; patient reports never having one Continue to encourage alcohol and cocaine cessation; provide resources

## 2020-08-26 ENCOUNTER — Other Ambulatory Visit: Payer: Self-pay | Admitting: Family Medicine

## 2020-08-26 DIAGNOSIS — R072 Precordial pain: Principal | ICD-10-CM

## 2020-08-26 DIAGNOSIS — F1490 Cocaine use, unspecified, uncomplicated: Secondary | ICD-10-CM

## 2020-08-26 LAB — HEMOGLOBIN A1C
Hgb A1c MFr Bld: 5.5 % (ref 4.8–5.6)
Mean Plasma Glucose: 111.15 mg/dL

## 2020-08-26 LAB — MRSA PCR SCREENING: MRSA by PCR: NEGATIVE

## 2020-08-26 MED ORDER — NITROGLYCERIN 0.4 MG SL SUBL: 0.4000 mg | SUBLINGUAL_TABLET | SUBLINGUAL | 1 refills | Status: DC | PRN

## 2020-08-26 MED ORDER — LOSARTAN POTASSIUM 25 MG PO TABS: 25.0000 mg | ORAL_TABLET | Freq: Every day | ORAL | 1 refills | Status: DC

## 2020-08-26 MED ORDER — AMLODIPINE BESYLATE 5 MG PO TABS: 5.0000 mg | ORAL_TABLET | Freq: Every day | ORAL | 1 refills | Status: DC

## 2020-08-26 MED ORDER — ISOSORBIDE MONONITRATE ER 30 MG PO TB24: 30.0000 mg | ORAL_TABLET | Freq: Every day | ORAL | 1 refills | Status: DC

## 2020-08-26 MED ORDER — THIAMINE HCL 100 MG PO TABS: 100.0000 mg | ORAL_TABLET | Freq: Every day | ORAL | 0 refills | Status: DC

## 2020-08-26 MED ORDER — FOLIC ACID 1 MG PO TABS: 1.0000 mg | ORAL_TABLET | Freq: Every day | ORAL | 0 refills | Status: AC

## 2020-08-26 MED ORDER — ATORVASTATIN CALCIUM 40 MG PO TABS: 40.0000 mg | ORAL_TABLET | Freq: Every day | ORAL | 0 refills | Status: AC

## 2020-08-26 NOTE — Progress Notes (Signed)
Pt is to be taken to front entrance of hospital to meet [employer].

## 2020-08-26 NOTE — TOC Transition Note (Signed)
Transition of Care [facility]) - CM/SW Discharge Note   Patient Details  Name: Bryce Ortega MRN: [id number] Date of Birth: August 31, 1960  [facility]) CM/SW Contact:  Leone Haven, RN Phone Number: 08/26/2020, 2:43 PM   Clinical Narrative:    Patient is for dc today, MD to send scripts to [facility] Dept.  TOC Pharmacy to fill the nitro for him.  NCM assisted with [employer].  He is for dc.   Final next level of care: Home/Self Care Barriers to Discharge: No Barriers Identified   Patient Goals and CMS Choice        Discharge Placement                       Discharge Plan and Services                                     Social Determinants of Health (SDOH) Interventions     Readmission Risk Interventions No flowsheet data found.

## 2020-08-26 NOTE — Discharge Instructions (Signed)
You were admitted with chest pain, this is due to cocaine use. You did not have a heart attack but you have had heart attacks before likely due to cocaine use. Please follow up with Cardiology as an outpatient. We would also recommend that you reduce/quit using cocaine as this places you at much higher risk of having another heart attack because of cocaine use again.  Best wishes,  Family Medicine Team

## 2020-08-26 NOTE — Plan of Care (Signed)

## 2020-08-26 NOTE — TOC Initial Note (Signed)
Transition of Care [facility]) - Initial/Assessment Note    Patient Details  Name: Bryce Ortega MRN: [id number] Date of Birth: [birth year]  [facility]) CM/SW Contact:    Bethann Berkshire, Ages Phone Number: 08/26/2020, 12:28 PM  Clinical Narrative:                   Expected Discharge Plan: Home/Self Care Barriers to Discharge: No Barriers Identified   Patient Goals and CMS Choice    CSW met with pt for substance use consult. Pt states he lives in a home in [place] with 2 roommates. He works as a Training and development officer at [employer] receives PCP services at [facility]) clinic and gets his medications through the health department. Pt states that he only drinks alcohol about two times per week and does not get drunk when he does drink. Pt states he uses cocaine a few times a month. He explains he does not feel he needs treatment or other assistance with his use but he is willing to take a list of treatment resources. CSW circles resources listed that take individuals without insurance and explains the different levels of care.      Expected Discharge Plan and Services Expected Discharge Plan: Home/Self Care       Living arrangements for the past 2 months: Single Family Home                                      Prior Living Arrangements/Services Living arrangements for the past 2 months: Single Family Home Lives with:: Roommate   Do you feel safe going back to the place where you live?: Yes      Need for Family Participation in Patient Care: No (Comment) Care giver support system in place?: No (comment)      Activities of Daily Living Home Assistive Devices/Equipment: None ADL Screening (condition at time of admission) Patient's cognitive ability adequate to safely complete daily activities?: Yes Is the patient deaf or have difficulty hearing?: No Does the patient have difficulty seeing, even when wearing glasses/contacts?: No Does the patient have  difficulty concentrating, remembering, or making decisions?: No Patient able to express need for assistance with ADLs?: Yes Does the patient have difficulty dressing or bathing?: No Independently performs ADLs?: Yes (appropriate for developmental age) Does the patient have difficulty walking or climbing stairs?: No Weakness of Legs: None Weakness of Arms/Hands: None  Permission Sought/Granted                  Emotional Assessment Appearance:: Appears stated age Attitude/Demeanor/Rapport: Engaged Affect (typically observed): Accepting Orientation: : Oriented to Self,Oriented to Place,Oriented to  Time,Oriented to Situation Alcohol / Substance Use: Alcohol Use,Illicit Drugs    Admission diagnosis:  Precordial chest pain [R07.2] Elevated troponin [R77.8] Chest pain [R07.9] Patient Active Problem List   Diagnosis Date Noted  . AKI (acute kidney injury) ([place]) 07/07/2017  . Ischemia, myocardial, acute ([place]) 07/06/2017  . Elevated troponin   . Cocaine adverse reaction 02/23/2017  . Chest pain 02/22/2017  . Mild CAD 02/22/2017  . NSTEMI (non-ST elevated myocardial infarction) ([place]) 05/15/2015  . Cocaine abuse ([place]) 05/15/2015  . Chest pain at rest   . Cardiac arrest ([place]) 05/10/2015  . Hyperlipidemia 05/10/2015  . Essential hypertension 01/10/2009  . SHOULDER PAIN, RIGHT 01/10/2009  . CERVICALGIA 01/10/2009  . LOW BACK PAIN 01/10/2009   PCP:  Placey, Audrea Muscat, NP Pharmacy:   [place], [place] - [address] [place] [address] Phone: [phone] Fax: [phone]  [facility] [place], [place] - [address] [place] [address] Phone: [phone] Fax: [phone]     Social Determinants of Health (SDOH) Interventions    Readmission Risk Interventions No flowsheet data found.

## 2020-11-14 ENCOUNTER — Other Ambulatory Visit: Payer: Self-pay

## 2020-11-14 ENCOUNTER — Encounter (HOSPITAL_COMMUNITY): Payer: Self-pay

## 2020-11-14 ENCOUNTER — Emergency Department (HOSPITAL_COMMUNITY): Payer: Self-pay

## 2020-11-14 ENCOUNTER — Emergency Department (HOSPITAL_COMMUNITY)
Admission: EM | Admit: 2020-11-14 | Discharge: 2020-11-14 | Disposition: A | Discharge status: Home / Self Care | Payer: Self-pay | Attending: Emergency Medicine | Admitting: Emergency Medicine

## 2020-11-14 DIAGNOSIS — Z79899 Other long term (current) drug therapy: Secondary | ICD-10-CM | POA: Insufficient documentation

## 2020-11-14 DIAGNOSIS — Z7982 Long term (current) use of aspirin: Secondary | ICD-10-CM | POA: Insufficient documentation

## 2020-11-14 DIAGNOSIS — I201 Angina pectoris with documented spasm: Secondary | ICD-10-CM | POA: Insufficient documentation

## 2020-11-14 DIAGNOSIS — R079 Chest pain, unspecified: Secondary | ICD-10-CM

## 2020-11-14 DIAGNOSIS — F1410 Cocaine abuse, uncomplicated: Secondary | ICD-10-CM | POA: Insufficient documentation

## 2020-11-14 DIAGNOSIS — I10 Essential (primary) hypertension: Secondary | ICD-10-CM | POA: Insufficient documentation

## 2020-11-14 LAB — CBC
HCT: 54.4 % — ABNORMAL HIGH (ref 39.0–52.0)
Hemoglobin: 18.2 g/dL — ABNORMAL HIGH (ref 13.0–17.0)
MCH: 31.5 pg (ref 26.0–34.0)
MCHC: 33.5 g/dL (ref 30.0–36.0)
MCV: 94.3 fL (ref 80.0–100.0)
Platelets: 347 10*3/uL (ref 150–400)
RBC: 5.77 MIL/uL (ref 4.22–5.81)
RDW: 12.8 % (ref 11.5–15.5)
WBC: 7.9 10*3/uL (ref 4.0–10.5)
nRBC: 0 % (ref 0.0–0.2)

## 2020-11-14 LAB — BASIC METABOLIC PANEL
Anion gap: 12 (ref 5–15)
BUN: 12 mg/dL (ref 6–20)
CO2: 22 mmol/L (ref 22–32)
Calcium: 9.1 mg/dL (ref 8.9–10.3)
Chloride: 103 mmol/L (ref 98–111)
Creatinine, Ser: 0.96 mg/dL (ref 0.61–1.24)
GFR, Estimated: >60 mL/min (ref >60)
Glucose, Bld: 112 mg/dL — ABNORMAL HIGH (ref 70–99)
Potassium: 4.4 mmol/L (ref 3.5–5.1)
Sodium: 137 mmol/L (ref 135–145)

## 2020-11-14 LAB — TROPONIN I (HIGH SENSITIVITY): Troponin I (High Sensitivity): 7 ng/L (ref <18)

## 2020-11-14 MED ORDER — NITROGLYCERIN 0.4 MG SL SUBL
0.4000 mg | SUBLINGUAL_TABLET | SUBLINGUAL | Status: DC | PRN
  Administered 2020-11-14: 0.4 mg via SUBLINGUAL
  Filled 2020-11-14: qty 1

## 2020-11-14 MED ORDER — ISOSORBIDE MONONITRATE ER 30 MG PO TB24: 30.0000 mg | ORAL_TABLET | Freq: Every day | ORAL | 0 refills | Status: DC

## 2020-11-14 MED ORDER — ASPIRIN 81 MG PO CHEW
324.0000 mg | CHEWABLE_TABLET | Freq: Once | ORAL | Status: AC
  Administered 2020-11-14: 324 mg via ORAL
  Filled 2020-11-14: qty 4

## 2020-11-14 MED ORDER — AMLODIPINE BESYLATE 5 MG PO TABS: 5.0000 mg | ORAL_TABLET | Freq: Every day | ORAL | 0 refills | Status: AC

## 2020-11-14 NOTE — ED Provider Notes (Signed)
[facility]-EMERGENCY DEPT Provider Note   CSN: [id number] Arrival date & time: 11/14/20  1342     History Chief Complaint  Patient presents with  . Chest Pain    Bryce Ortega is a 60 y.o. male.  HPI    60 year old male with history of cocaine use, NSTEMI comes in with chief complaint of chest pain.  Patient admits to cocaine use yesterday.  Reports that he woke up with chest pain, left-sided with radiation towards the shoulder.  Patient had pain for several minutes before he decided to call EMS.  He admits to alcohol consumption today as well.  Patient was given nitroglycerin prior to my assessment along with aspirin.  He feels a lot better now and is chest pain-free.  Records indicate that patient was admitted to the hospital for what ended up being coronary vasospasms leading to myocardial injury.  Past Medical History:  Diagnosis Date  . Cocaine abuse (HCC)   . Complication of anesthesia   . H/O ventricular fibrillation 2016   s/p cocaine use  . High cholesterol   . Hypertension   . MI (mitral incompetence)   . Myocardial infarction (HCC) 2016; ?06/2017   "both related to cocaine" (03/18/2018)  . Nonobstructive atherosclerosis of coronary artery 2016   LHC showed normal EF and mild non-obstructive CAD    Patient Active Problem List   Diagnosis Date Noted  . Cocaine use   . AKI (acute kidney injury) (HCC) 07/07/2017  . Ischemia, myocardial, acute (HCC) 07/06/2017  . Elevated troponin   . Cocaine adverse reaction 02/23/2017  . Precordial chest pain 02/22/2017  . Mild CAD 02/22/2017  . NSTEMI (non-ST elevated myocardial infarction) (HCC) 05/15/2015  . Cocaine abuse (HCC) 05/15/2015  . Chest pain at rest   . Cardiac arrest (HCC) 05/10/2015  . Hyperlipidemia 05/10/2015  . Essential hypertension 01/10/2009  . SHOULDER PAIN, RIGHT 01/10/2009  . CERVICALGIA 01/10/2009  . LOW BACK PAIN 01/10/2009    Past Surgical History:  Procedure  Laterality Date  . CARDIAC CATHETERIZATION N/A 05/10/2015   Procedure: Left Heart Cath and Coronary Angiography;  Surgeon: Dolores Patty, MD;  Location: [facility] INVASIVE CV LAB;  Service: Cardiovascular;  Laterality: N/A;  . LEFT HEART CATH AND CORONARY ANGIOGRAPHY N/A 12/27/2017   Procedure: LEFT HEART CATH AND CORONARY ANGIOGRAPHY;  Surgeon: Lyn Records, MD;  Location: [facility] INVASIVE CV LAB;  Service: Cardiovascular;  Laterality: N/A;  . TONSILLECTOMY         Family History  Problem Relation Age of Onset  . Hypertension Father   . CAD Father        s/p CABG  . Heart disease Mother   . Heart disease Maternal Grandfather     Social History   Tobacco Use  . Smoking status: Never Smoker  . Smokeless tobacco: Never Used  Vaping Use  . Vaping Use: Never used  Substance Use Topics  . Alcohol use: Yes    Alcohol/week: 13.0 standard drinks    Types: 13 Cans of beer per week  . Drug use: Yes    Types: Cocaine    Home Medications Prior to Admission medications   Medication Sig Start Date End Date Taking? Authorizing Provider  amLODipine (NORVASC) 5 MG tablet Take 1 tablet (5 mg total) by mouth daily. 11/14/20  Yes Derwood Kaplan, MD  isosorbide mononitrate (IMDUR) 30 MG 24 hr tablet Take 1 tablet (30 mg total) by mouth daily. 11/14/20  Yes Derwood Kaplan, MD  amLODipine (NORVASC) 5 MG tablet Take 1 tablet (5 mg total) by mouth daily. 08/26/20 10/25/20  Sabino Dick, DO  amLODipine (NORVASC) 5 MG tablet TAKE 1 TABLET (5 MG TOTAL) BY MOUTH DAILY. 08/26/20 08/26/21  Sabino Dick, DO  aspirin EC 81 MG tablet Take 1 tablet (81 mg total) by mouth daily. 03/20/18   Hongalgi, Maximino Greenland, MD  atorvastatin (LIPITOR) 40 MG tablet Take 1 tablet (40 mg total) by mouth daily. 08/26/20   Sabino Dick, DO  folic acid (FOLVITE) 1 MG tablet TAKE 1 TABLET (1 MG TOTAL) BY MOUTH DAILY. 08/26/20 08/26/21  Sabino Dick, DO  isosorbide mononitrate (IMDUR) 30 MG 24 hr tablet Take 1 tablet (30  mg total) by mouth daily. 08/26/20 10/25/20  Sabino Dick, DO  isosorbide mononitrate (IMDUR) 30 MG 24 hr tablet TAKE 1 TABLET (30 MG TOTAL) BY MOUTH DAILY. 08/26/20 08/26/21  Sabino Dick, DO  losartan (COZAAR) 25 MG tablet Take 1 tablet (25 mg total) by mouth daily. 08/26/20 10/25/20  Sabino Dick, DO  losartan (COZAAR) 25 MG tablet TAKE 1 TABLET (25 MG TOTAL) BY MOUTH DAILY. 08/26/20 08/26/21  Sabino Dick, DO  nitroGLYCERIN (NITROSTAT) 0.4 MG SL tablet Place 1 tablet (0.4 mg total) under the tongue every 5 (five) minutes as needed for chest pain (for 3 doses). 08/26/20   Sabino Dick, DO  nitroGLYCERIN (NITROSTAT) 0.4 MG SL tablet PLACE 1 TABLET (0.4 MG TOTAL) UNDER THE TONGUE EVERY FIVE MINUTES AS NEEDED FOR CHEST PAIN (FOR 3 DOSES). 08/26/20 08/26/21  Sabino Dick, DO  thiamine 100 MG tablet Take 1 tablet (100 mg total) by mouth daily. 08/26/20   Sabino Dick, DO  thiamine 100 MG tablet TAKE 1 TABLET (100 MG TOTAL) BY MOUTH DAILY. 08/26/20 08/26/21  Sabino Dick, DO    Allergies    Propoxyphene n-acetaminophen  Review of Systems   Review of Systems  Constitutional: Positive for activity change.  Respiratory: Negative for shortness of breath.   Cardiovascular: Positive for chest pain.  Gastrointestinal: Negative for nausea and vomiting.  Neurological: Negative for dizziness.  All other systems reviewed and are negative.   Physical Exam Updated Vital Signs BP (!) 164/111   Pulse 72   Temp 97.7 F (36.5 C) (Oral)   Resp 18   Ht 5\' 11"  (1.803 m)   Wt 77.1 kg   SpO2 98%   BMI 23.71 kg/m   Physical Exam Vitals and nursing note reviewed.  Constitutional:      Appearance: He is well-developed.  HENT:     Head: Atraumatic.  Cardiovascular:     Rate and Rhythm: Normal rate.     Heart sounds: Normal heart sounds.  Pulmonary:     Effort: Pulmonary effort is normal.  Musculoskeletal:     Cervical back: Neck supple.  Skin:     General: Skin is warm.  Neurological:     Mental Status: He is alert and oriented to person, place, and time.     ED Results / Procedures / Treatments   Labs (all labs ordered are listed, but only abnormal results are displayed) Labs Reviewed  BASIC METABOLIC PANEL - Abnormal; Notable for the following components:      Result Value   Glucose, Bld 112 (*)    All other components within normal limits  CBC - Abnormal; Notable for the following components:   Hemoglobin 18.2 (*)    HCT 54.4 (*)    All other components within normal limits  RAPID URINE DRUG SCREEN, HOSP  PERFORMED  ETHANOL  TROPONIN I (HIGH SENSITIVITY)  TROPONIN I (HIGH SENSITIVITY)    EKG EKG Interpretation  Date/Time:  Thursday November 14 2020 13:51:30 EDT Ventricular Rate:  83 PR Interval:  167 QRS Duration: 77 QT Interval:  379 QTC Calculation: 446 R Axis:   54 Text Interpretation: Sinus rhythm ST elevation suggests acute pericarditis anteriorST elevation no reciprocal depression Confirmed by Derwood Kaplan ([id number]) on 11/14/2020 2:31:56 PM   Radiology DG Chest Port 1 View  Result Date: 11/14/2020 CLINICAL DATA:  Chest pain and lethargy EXAM: PORTABLE CHEST 1 VIEW COMPARISON:  August 25, 2020 FINDINGS: The lungs are clear. The heart size and pulmonary vascularity are normal. No adenopathy. No bone lesions. IMPRESSION: Lungs clear.  Heart size normal.  No evident adenopathy. Electronically Signed   By: Bretta Bang III M.D.   On: 11/14/2020 14:44    Procedures Procedures   Medications Ordered in ED Medications  nitroGLYCERIN (NITROSTAT) SL tablet 0.4 mg (0.4 mg Sublingual Given 11/14/20 1417)  aspirin chewable tablet 324 mg (324 mg Oral Given 11/14/20 1417)    ED Course  I have reviewed the triage vital signs and the nursing notes.  Pertinent labs & imaging results that were available during my care of the patient were reviewed by me and considered in my medical decision making (see chart for  details).    MDM Rules/Calculators/A&P                          59 year old male comes in with chief complaint of chest pain.  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  He is currently chest pain-free, after he received nitroglycerin.  Basic labs and EKG ordered.  EKG is not showing any acute ischemic changes.  3:42 PM Initial HST and is normal.  Patient remains chest pain-free.  No need for serial troponin given that patient is chest pain-free and had no elevated troponin despite having pain for more than 2 hours.  Final Clinical Impression(s) / ED Diagnoses Final diagnoses:  Coronary vasospasm (HCC)  Cocaine abuse (HCC)    Rx / DC Orders ED Discharge Orders         Ordered    amLODipine (NORVASC) 5 MG tablet  Daily        11/14/20 1539    isosorbide mononitrate (IMDUR) 30 MG 24 hr tablet  Daily        11/14/20 1539           Derwood Kaplan, MD 11/14/20 1542

## 2020-11-14 NOTE — ED Triage Notes (Signed)
Patient c/o mid chest pain since waking this AM. patient c/o SOB and diaphoresis. paitenat denies any N/V.

## 2020-11-14 NOTE — ED Provider Notes (Signed)
Emergency Medicine Provider Triage Evaluation Note  Bryce Ortega , a 59 y.o. male  was evaluated in triage.  Pt complains of cp.  Review of Systems  Positive: Cp, sob, tightness to chest Negative: Fever, cough  Physical Exam  BP (!) 166/119 (BP Location: Right Arm)   Pulse 78   Temp 97.7 F (36.5 C) (Oral)   Resp 20   Ht 5\' 11"  (1.803 m)   Wt 77.1 kg   SpO2 100%   BMI 23.71 kg/m  Gen:   Awake, appears uncomfortable Resp:  Normal effort  MSK:   Moves extremities without difficulty  Other:    Medical Decision Making  Medically screening exam initiated at 1:54 PM.  Appropriate orders placed.  was informed that the remainder of the evaluation will be completed by another provider, this initial triage assessment does not replace that evaluation, and the importance of remaining in the ED until their evaluation is complete.  Report chest pain and pressure since this AM.  Hx of MI, last cocaine use was last night.     [name], PA-C 11/14/20 1355    01/14/21, MD 11/14/20 [phone]

## 2020-11-14 NOTE — Discharge Instructions (Signed)
We saw in the ER for chest pain.  Fortunately, you have been chest pain-free and the EKG is not showing any heart damage.  Your heart enzyme is also normal.  We suspect that your pain was because of coronary vasospasms from cocaine use.  Prescribed of the BP medications for the next 2 weeks at your request.  Please see your primary care doctor for further medication orders and also refrain from cocaine use

## 2020-12-06 ENCOUNTER — Emergency Department (HOSPITAL_COMMUNITY): Payer: Self-pay

## 2020-12-06 ENCOUNTER — Observation Stay (HOSPITAL_BASED_OUTPATIENT_CLINIC_OR_DEPARTMENT_OTHER): Payer: Self-pay

## 2020-12-06 ENCOUNTER — Observation Stay (HOSPITAL_COMMUNITY)
Admission: EM | Admit: 2020-12-06 | Discharge: 2020-12-07 | Disposition: A | Discharge status: Home / Self Care | Payer: Self-pay | Attending: Internal Medicine | Admitting: Internal Medicine

## 2020-12-06 DIAGNOSIS — Z20822 Contact with and (suspected) exposure to covid-19: Secondary | ICD-10-CM | POA: Insufficient documentation

## 2020-12-06 DIAGNOSIS — Y900 Blood alcohol level of less than 20 mg/100 ml: Secondary | ICD-10-CM | POA: Insufficient documentation

## 2020-12-06 DIAGNOSIS — I10 Essential (primary) hypertension: Secondary | ICD-10-CM | POA: Diagnosis present

## 2020-12-06 DIAGNOSIS — Z7982 Long term (current) use of aspirin: Secondary | ICD-10-CM | POA: Insufficient documentation

## 2020-12-06 DIAGNOSIS — Z79899 Other long term (current) drug therapy: Secondary | ICD-10-CM | POA: Insufficient documentation

## 2020-12-06 DIAGNOSIS — R079 Chest pain, unspecified: Secondary | ICD-10-CM | POA: Diagnosis present

## 2020-12-06 DIAGNOSIS — R0789 Other chest pain: Principal | ICD-10-CM | POA: Insufficient documentation

## 2020-12-06 DIAGNOSIS — I214 Non-ST elevation (NSTEMI) myocardial infarction: Secondary | ICD-10-CM

## 2020-12-06 DIAGNOSIS — E785 Hyperlipidemia, unspecified: Secondary | ICD-10-CM | POA: Diagnosis present

## 2020-12-06 DIAGNOSIS — Z955 Presence of coronary angioplasty implant and graft: Secondary | ICD-10-CM | POA: Insufficient documentation

## 2020-12-06 DIAGNOSIS — F1910 Other psychoactive substance abuse, uncomplicated: Secondary | ICD-10-CM | POA: Diagnosis present

## 2020-12-06 LAB — CBC
HCT: 51.5 % (ref 39.0–52.0)
Hemoglobin: 16.7 g/dL (ref 13.0–17.0)
MCH: 31.5 pg (ref 26.0–34.0)
MCHC: 32.4 g/dL (ref 30.0–36.0)
MCV: 97 fL (ref 80.0–100.0)
Platelets: 310 10*3/uL (ref 150–400)
RBC: 5.31 MIL/uL (ref 4.22–5.81)
RDW: 13.1 % (ref 11.5–15.5)
WBC: 7.5 10*3/uL (ref 4.0–10.5)
nRBC: 0 % (ref 0.0–0.2)

## 2020-12-06 LAB — TROPONIN I (HIGH SENSITIVITY)
Troponin I (High Sensitivity): 11 ng/L (ref <18)
Troponin I (High Sensitivity): 78 ng/L — ABNORMAL HIGH (ref <18)
Troponin I (High Sensitivity): 105 ng/L (ref <18)

## 2020-12-06 LAB — ECHOCARDIOGRAM COMPLETE
Area-P 1/2: 2.33 cm2
Height: 68 in
S' Lateral: 3.8 cm
Weight: 2720 oz

## 2020-12-06 LAB — BASIC METABOLIC PANEL
Anion gap: 6 (ref 5–15)
BUN: 14 mg/dL (ref 6–20)
CO2: 24 mmol/L (ref 22–32)
Calcium: 8.5 mg/dL — ABNORMAL LOW (ref 8.9–10.3)
Chloride: 105 mmol/L (ref 98–111)
Creatinine, Ser: 1.1 mg/dL (ref 0.61–1.24)
GFR, Estimated: >60 mL/min (ref >60)
Glucose, Bld: 125 mg/dL — ABNORMAL HIGH (ref 70–99)
Potassium: 3.6 mmol/L (ref 3.5–5.1)
Sodium: 135 mmol/L (ref 135–145)

## 2020-12-06 LAB — ETHANOL: Alcohol, Ethyl (B): <10 mg/dL (ref <10)

## 2020-12-06 LAB — PROTIME-INR
INR: 0.9 (ref 0.8–1.2)
Prothrombin Time: 11.9 seconds (ref 11.4–15.2)

## 2020-12-06 LAB — SARS CORONAVIRUS 2 (TAT 6-24 HRS): SARS Coronavirus 2: NEGATIVE

## 2020-12-06 LAB — APTT: aPTT: 26 seconds (ref 24–36)

## 2020-12-06 LAB — HEPARIN LEVEL (UNFRACTIONATED): Heparin Unfractionated: 0.14 IU/mL — ABNORMAL LOW (ref 0.30–0.70)

## 2020-12-06 MED ORDER — THIAMINE HCL 100 MG PO TABS
100.0000 mg | ORAL_TABLET | Freq: Every day | ORAL | Status: DC
  Administered 2020-12-06 – 2020-12-07 (×2): 100 mg via ORAL
  Filled 2020-12-06 (×2): qty 1

## 2020-12-06 MED ORDER — ASPIRIN EC 81 MG PO TBEC
81.0000 mg | DELAYED_RELEASE_TABLET | Freq: Every day | ORAL | Status: DC
  Administered 2020-12-07: 81 mg via ORAL
  Filled 2020-12-06: qty 1

## 2020-12-06 MED ORDER — THIAMINE HCL 100 MG/ML IJ SOLN: 100.0000 mg | Freq: Every day | INTRAMUSCULAR | Status: DC

## 2020-12-06 MED ORDER — LORAZEPAM 1 MG PO TABS: 1.0000 mg | ORAL_TABLET | ORAL | Status: DC | PRN

## 2020-12-06 MED ORDER — ISOSORBIDE MONONITRATE ER 30 MG PO TB24: 30.0000 mg | ORAL_TABLET | Freq: Every day | ORAL | Status: DC

## 2020-12-06 MED ORDER — LOSARTAN POTASSIUM 25 MG PO TABS
25.0000 mg | ORAL_TABLET | Freq: Every day | ORAL | Status: DC
  Administered 2020-12-06 – 2020-12-07 (×2): 25 mg via ORAL
  Filled 2020-12-06 (×2): qty 1

## 2020-12-06 MED ORDER — ACETAMINOPHEN 325 MG PO TABS: 650.0000 mg | ORAL_TABLET | ORAL | Status: DC | PRN

## 2020-12-06 MED ORDER — AMLODIPINE BESYLATE 5 MG PO TABS
5.0000 mg | ORAL_TABLET | Freq: Once | ORAL | Status: AC
  Administered 2020-12-06: 5 mg via ORAL
  Filled 2020-12-06: qty 1

## 2020-12-06 MED ORDER — ADULT MULTIVITAMIN W/MINERALS CH
1.0000 | ORAL_TABLET | Freq: Every day | ORAL | Status: DC
  Administered 2020-12-06 – 2020-12-07 (×2): 1 via ORAL
  Filled 2020-12-06 (×2): qty 1

## 2020-12-06 MED ORDER — LORAZEPAM 2 MG/ML IJ SOLN
1.0000 mg | INTRAMUSCULAR | Status: DC | PRN
Start: 2020-12-06 — End: 2020-12-07

## 2020-12-06 MED ORDER — FOLIC ACID 1 MG PO TABS
1.0000 mg | ORAL_TABLET | Freq: Every day | ORAL | Status: DC
  Administered 2020-12-06 – 2020-12-07 (×2): 1 mg via ORAL
  Filled 2020-12-06 (×2): qty 1

## 2020-12-06 MED ORDER — ATORVASTATIN CALCIUM 40 MG PO TABS
40.0000 mg | ORAL_TABLET | Freq: Every day | ORAL | Status: DC
  Administered 2020-12-06 – 2020-12-07 (×2): 40 mg via ORAL
  Filled 2020-12-06 (×2): qty 1

## 2020-12-06 MED ORDER — HEPARIN (PORCINE) 25000 UT/250ML-% IV SOLN
1350.0000 [IU]/h | INTRAVENOUS | Status: DC
  Administered 2020-12-06: 1000 [IU]/h via INTRAVENOUS
  Administered 2020-12-07: 1200 [IU]/h via INTRAVENOUS
  Filled 2020-12-06 (×2): qty 250

## 2020-12-06 MED ORDER — LOSARTAN POTASSIUM 50 MG PO TABS: 25.0000 mg | ORAL_TABLET | Freq: Every day | ORAL | Status: DC

## 2020-12-06 MED ORDER — ISOSORBIDE MONONITRATE ER 30 MG PO TB24
30.0000 mg | ORAL_TABLET | Freq: Every day | ORAL | Status: DC
  Administered 2020-12-06 – 2020-12-07 (×2): 30 mg via ORAL
  Filled 2020-12-06 (×2): qty 1

## 2020-12-06 MED ORDER — ONDANSETRON HCL 4 MG/2ML IJ SOLN: 4.0000 mg | Freq: Four times a day (QID) | INTRAMUSCULAR | Status: DC | PRN

## 2020-12-06 MED ORDER — HEPARIN BOLUS VIA INFUSION
2000.0000 [IU] | Freq: Once | INTRAVENOUS | Status: AC
  Administered 2020-12-07: 2000 [IU] via INTRAVENOUS
  Filled 2020-12-06: qty 2000

## 2020-12-06 MED ORDER — AMLODIPINE BESYLATE 5 MG PO TABS
5.0000 mg | ORAL_TABLET | Freq: Every day | ORAL | Status: DC
  Administered 2020-12-07: 5 mg via ORAL
  Filled 2020-12-06: qty 1

## 2020-12-06 MED ORDER — NITROGLYCERIN 0.4 MG SL SUBL
SUBLINGUAL_TABLET | SUBLINGUAL | Status: AC
  Administered 2020-12-06: 0.4 mg
  Filled 2020-12-06: qty 1

## 2020-12-06 MED ORDER — HEPARIN BOLUS VIA INFUSION
4000.0000 [IU] | Freq: Once | INTRAVENOUS | Status: AC
  Administered 2020-12-06: 4000 [IU] via INTRAVENOUS
  Filled 2020-12-06: qty 4000

## 2020-12-06 MED ORDER — ALUM & MAG HYDROXIDE-SIMETH 200-200-20 MG/5ML PO SUSP: 30.0000 mL | Freq: Four times a day (QID) | ORAL | Status: DC | PRN

## 2020-12-06 NOTE — ED Triage Notes (Signed)
Pt here from home with c/o chest pain left side radiated down left arm , pt did do some cocaine and etoh recently ,

## 2020-12-06 NOTE — H&P (Addendum)
History and Physical    Bryce Ortega XLK:[id number] DOB: 1960-08-05 DOA: 12/06/2020  Referring MD/NP/PA: Tilden Fossa, MD PCP: Lavinia Sharps, NP  Patient coming from:  Chief Complaint: Chest pain  I have personally briefly reviewed patient's old medical records in [facility]   HPI: Bryce Ortega is a 60 y.o. male with medical history significant of hypertension, hyperlipidemia, MI, ventricular fibrillation, and polysubstance abuse (including cocaine and alcohol) presents with complaints of left-sided chest pain that started this morning.  He describes it as a sharp left-sided pain that was constant with radiation down his left arm.  Noted associated symptoms of some shortness of breath, cold sweats, and nausea.  Denied having any vomiting, lightheadedness, loss of consciousness.  Patient reports that he last snorted cocaine 2 days ago.  He drinks a 40 ounce of liquor every other day or so, but does not drink on a daily basis and denies any history of withdrawal.  He reports that the chest pain is more superior than previously when he had chest pain symptoms after using cocaine.  He had been seen in the emergency department for the same on 6/2 after recently using cocaine as well cardiac enzymes were negative and symptoms possibly secondary to coronary vasospasm.  Last heart cath revealed widely patent coronaries back in 12/2017.  ED Course: Upon admission into the emergency department patient was seen to be afebrile with blood pressures 138/96-171/110, and all other vital signs maintained.  Labs significant for high-sensitivity troponin 11->78.  Chest x-ray showed no acute abnormalities.  Case was discussed with cardiology who recommended admission to medicine for cycling of troponins.  Patient was started on a heparin drip per pharmacy.  Review of Systems  Constitutional:  Positive for diaphoresis. Negative for fever and malaise/fatigue.  HENT:  Negative for ear discharge.    Eyes:  Negative for photophobia and pain.  Respiratory:  Positive for cough and shortness of breath.   Cardiovascular:  Positive for chest pain. Negative for leg swelling.  Gastrointestinal:  Positive for nausea. Negative for diarrhea and vomiting.  Genitourinary:  Negative for dysuria and hematuria.  Musculoskeletal:  Negative for falls.  Skin:  Negative for rash.  Neurological:  Negative for dizziness, focal weakness and loss of consciousness.  Psychiatric/Behavioral:  Positive for substance abuse. Negative for memory loss.   All other systems reviewed and are negative.  Past Medical History:  Diagnosis Date   Cocaine abuse (HCC)    Complication of anesthesia    H/O ventricular fibrillation 2016   s/p cocaine use   High cholesterol    Hypertension    MI (mitral incompetence)    Myocardial infarction (HCC) 2016; ?06/2017   "both related to cocaine" (03/18/2018)   Nonobstructive atherosclerosis of coronary artery 2016   LHC showed normal EF and mild non-obstructive CAD    Past Surgical History:  Procedure Laterality Date   CARDIAC CATHETERIZATION N/A 05/10/2015   Procedure: Left Heart Cath and Coronary Angiography;  Surgeon: Dolores Patty, MD;  Location: [facility] INVASIVE CV LAB;  Service: Cardiovascular;  Laterality: N/A;   LEFT HEART CATH AND CORONARY ANGIOGRAPHY N/A 12/27/2017   Procedure: LEFT HEART CATH AND CORONARY ANGIOGRAPHY;  Surgeon: Lyn Records, MD;  Location: [facility] INVASIVE CV LAB;  Service: Cardiovascular;  Laterality: N/A;   TONSILLECTOMY       reports that he has never smoked. He has never used smokeless tobacco. He reports current alcohol use of about 13.0 standard drinks of alcohol per  week. He reports current drug use. Drug: Cocaine.  Allergies  Allergen Reactions   Propoxyphene N-Acetaminophen Nausea Only and Other (See Comments)    GI upset    Family History  Problem Relation Age of Onset   Hypertension Father    CAD Father        s/p CABG   Heart  disease Mother    Heart disease Maternal Grandfather     Prior to Admission medications   Medication Sig Start Date End Date Taking? Authorizing Provider  amLODipine (NORVASC) 5 MG tablet Take 1 tablet (5 mg total) by mouth daily. 11/14/20  Yes Derwood Kaplan, MD  aspirin EC 81 MG tablet Take 1 tablet (81 mg total) by mouth daily. 03/20/18  Yes Hongalgi, Maximino Greenland, MD  atorvastatin (LIPITOR) 40 MG tablet Take 1 tablet (40 mg total) by mouth daily. 08/26/20  Yes Sabino Dick, DO  folic acid (FOLVITE) 1 MG tablet TAKE 1 TABLET (1 MG TOTAL) BY MOUTH DAILY. Patient taking differently: Take 1 mg by mouth daily. 08/26/20 08/26/21 Yes Espinoza, Myrlene Broker, DO  isosorbide mononitrate (IMDUR) 30 MG 24 hr tablet Take 1 tablet (30 mg total) by mouth daily. 11/14/20  Yes Nanavati, Ankit, MD  losartan (COZAAR) 25 MG tablet TAKE 1 TABLET (25 MG TOTAL) BY MOUTH DAILY. Patient taking differently: Take 25 mg by mouth daily. 08/26/20 08/26/21 Yes Espinoza, Alejandra, DO  nitroGLYCERIN (NITROSTAT) 0.4 MG SL tablet PLACE 1 TABLET (0.4 MG TOTAL) UNDER THE TONGUE EVERY FIVE MINUTES AS NEEDED FOR CHEST PAIN (FOR 3 DOSES). Patient taking differently: Place 0.4 mg under the tongue every 5 (five) minutes as needed for chest pain. 08/26/20 08/26/21 Yes Espinoza, Alejandra, DO  thiamine 100 MG tablet TAKE 1 TABLET (100 MG TOTAL) BY MOUTH DAILY. Patient taking differently: Take 100 mg by mouth daily. 08/26/20 08/26/21 Yes Espinoza, Alejandra, DO  amLODipine (NORVASC) 5 MG tablet TAKE 1 TABLET (5 MG TOTAL) BY MOUTH DAILY. Patient not taking: No sig reported 08/26/20 08/26/21  Sabino Dick, DO  isosorbide mononitrate (IMDUR) 30 MG 24 hr tablet TAKE 1 TABLET (30 MG TOTAL) BY MOUTH DAILY. Patient not taking: No sig reported 08/26/20 08/26/21  Sabino Dick, DO    Physical Exam:  Constitutional: Middle-age male who appears to be a little lethargic Vitals:   12/06/20 1115 12/06/20 1130 12/06/20 1215 12/06/20 1245  BP: (!)  140/100 (!) 171/110 (!) 158/97 (!) 149/105  Pulse: 65 72 66 69  Resp: 15 15 19 16   Temp:      TempSrc:      SpO2: 95% 95% 91% 94%  Weight:    77.1 kg  Height:    5\' 11"  (1.803 m)   Eyes: PERRL, lids and conjunctivae normal ENMT: Mucous membranes are moist. Posterior pharynx clear of any exudate or lesions.  Neck: normal, supple, no masses, no thyromegaly Respiratory: clear to auscultation bilaterally, no wheezing, no crackles. Normal respiratory effort. No accessory muscle use.  Cardiovascular: Regular rate and rhythm, no murmurs / rubs / gallops. No extremity edema. 2+ pedal pulses. No carotid bruits.  Abdomen: no tenderness, no masses palpated. No hepatosplenomegaly. Bowel sounds positive.  Musculoskeletal: no clubbing / cyanosis. No joint deformity upper and lower extremities. Good ROM, no contractures. Normal muscle tone.  Skin: no rashes, lesions, ulcers. No induration Neurologic: CN 2-12 grossly intact. Strength 5/5 in all 4.  No tremor present. Psychiatric: Fair judgment and insight. Oriented x 3. Normal mood.     Labs on Admission: I have personally reviewed  following labs and imaging studies  CBC: Recent Labs  Lab 12/06/20 0748  WBC 7.5  HGB 16.7  HCT 51.5  MCV 97.0  PLT 310   Basic Metabolic Panel: Recent Labs  Lab 12/06/20 0748  NA 135  K 3.6  CL 105  CO2 24  GLUCOSE 125*  BUN 14  CREATININE 1.10  CALCIUM 8.5*   GFR: Estimated Creatinine Clearance: 77 mL/min (by C-G formula based on SCr of 1.1 mg/dL). Liver Function Tests: No results for input(s): AST, ALT, ALKPHOS, BILITOT, PROT, ALBUMIN in the last 168 hours. No results for input(s): LIPASE, AMYLASE in the last 168 hours. No results for input(s): AMMONIA in the last 168 hours. Coagulation Profile: No results for input(s): INR, PROTIME in the last 168 hours. Cardiac Enzymes: No results for input(s): CKTOTAL, CKMB, CKMBINDEX, TROPONINI in the last 168 hours. BNP (last 3 results) No results for  input(s): PROBNP in the last 8760 hours. HbA1C: No results for input(s): HGBA1C in the last 72 hours. CBG: No results for input(s): GLUCAP in the last 168 hours. Lipid Profile: No results for input(s): CHOL, HDL, LDLCALC, TRIG, CHOLHDL, LDLDIRECT in the last 72 hours. Thyroid Function Tests: No results for input(s): TSH, T4TOTAL, FREET4, T3FREE, THYROIDAB in the last 72 hours. Anemia Panel: No results for input(s): VITAMINB12, FOLATE, FERRITIN, TIBC, IRON, RETICCTPCT in the last 72 hours. Urine analysis:    Component Value Date/Time   COLORURINE YELLOW 09/26/2019 1610   APPEARANCEUR CLEAR 09/26/2019 1610   LABSPEC 1.011 09/26/2019 1610   PHURINE 5.0 09/26/2019 1610   GLUCOSEU 50 (A) 09/26/2019 1610   HGBUR SMALL (A) 09/26/2019 1610   BILIRUBINUR NEGATIVE 09/26/2019 1610   KETONESUR NEGATIVE 09/26/2019 1610   PROTEINUR 30 (A) 09/26/2019 1610   NITRITE NEGATIVE 09/26/2019 1610   LEUKOCYTESUR NEGATIVE 09/26/2019 1610   Sepsis Labs: No results found for this or any previous visit (from the past 240 hour(s)).   Radiological Exams on Admission: DG Chest 2 View  Result Date: 12/06/2020 CLINICAL DATA:  LEFT side chest pain radiating down LEFT arm, recently did cocaine and ethanol EXAM: CHEST - 2 VIEW COMPARISON:  11/14/2020 FINDINGS: Normal heart size, mediastinal contours, and pulmonary vascularity. Lungs clear. No infiltrate, pleural effusion, or pneumothorax. Osseous structures unremarkable. IMPRESSION: No acute abnormalities. Electronically Signed   By: Ulyses Southward M.D.   On: 12/06/2020 08:24    EKG: Independently reviewed.Sinus rhythm at 76 bpm  Assessment/Plan Chest pain elevated troponin: Acute.  Patient presents with complaints of left-sided chest pain after recently using cocaine.  High-sensitivity troponins 11->78.  EKG without significant ischemic changes.  Case was discussed with cardiology who suspected symptoms secondary to cocaine use.  His cardiac catheter revealed  widely patent coronaries in 2019. Patient had been started on heparin drip. -Admit to a cardiac telemetry bed -Continue Heparin per pharmacy -Continue to trend cardiac troponin -Check echocardiogram -Continue aspirin -Consider need to formally consult cardiology if needed in a.m.  Essential hypertension: Home blood pressure medications include amlodipine 5 mg daily, isosorbide mononitrate 30 mg daily, and losartan 25 mg daily. -Continue home regimen as tolerated  Dyslipidemia -Continue atorvastatin  Polysubstance abuse: Patient admits to recently snorting cocaine 2 days ago.  He reports drinking a 40 ounce of liquor every other day, but not on a daily basis.  Patient's last drink was approximately 2 days ago. -Check urine drug screen and ethanol level -Continue to counsel on need of cessation of cocaine use -CIWA protocols with as needed Ativan -Transitions  of care consulted for substance abuse  DVT prophylaxis: Heparin Code Status: Full Family Communication: patient declined Disposition Plan: To be determined Consults called: None Admission status: Observation  Clydie Braun MD [facility]   If 7PM-7AM, please contact night-coverage   12/06/2020, 1:12 PM

## 2020-12-06 NOTE — ED Notes (Signed)
Patient transported to X-ray 

## 2020-12-06 NOTE — Progress Notes (Addendum)
ANTICOAGULATION CONSULT NOTE   Pharmacy Consult for heparin Indication: chest pain/ACS  Allergies  Allergen Reactions   Propoxyphene N-Acetaminophen Nausea Only and Other (See Comments)    GI upset    Patient Measurements: Height: 5\' 8"  (172.7 cm) Weight: 71 kg (156 lb 8.4 oz) IBW/kg (Calculated) : 68.4 Heparin Dosing Weight: 77.1 kg  Vital Signs: Temp: 98 F (36.7 C) (06/24 2034) Temp Source: Oral (06/24 2034) BP: 126/91 (06/24 2034) Pulse Rate: 72 (06/24 2034)  Labs: Recent Labs    12/06/20 0748 12/06/20 0945 12/06/20 1311 12/06/20 1651 12/06/20 2007  HGB 16.7  --   --   --   --   HCT 51.5  --   --   --   --   PLT 310  --   --   --   --   APTT  --   --  26  --   --   LABPROT  --   --  11.9  --   --   INR  --   --  0.9  --   --   HEPARINUNFRC  --   --   --   --  0.14*  CREATININE 1.10  --   --   --   --   TROPONINIHS 11 78*  --  105*  --      Estimated Creatinine Clearance: 70 mL/min (by C-G formula based on SCr of 1.1 mg/dL).  Assessment: 60 yo M that presents to ED with central chest pain for 1 hour prior to arrival. Sharp pain radiating down to his left arm. Hx of HTN, cocaine abuse (last used 3 days ago), HLD, and non-obstructive CAD  Heparin level 0.14 this PM  Goal of Therapy:  Heparin level 0.3-0.7 units/ml Monitor platelets by anticoagulation protocol: Yes   Plan:  Heparin 2000 units iv bolus x 1 Heparin drip to 1200 units / hr Heparin level in 6 hours  Thank you 46, PharmD   Please check AMION for all [facility] phone numbers After 10:00 PM, call Main Pharmacy [phone]

## 2020-12-06 NOTE — ED Provider Notes (Signed)
[facility] Provider Note   CSN: [id number] Arrival date & time: 12/06/20  3151     History No chief complaint on file.   [facility] is a 60 y.o. male.  The history is provided by the patient and medical records.  [facility] is a 60 y.o. male who presents to the Emergency Department complaining of chest pain. He presents to the emergency department complaining of central chest pain that started one hour prior to ED presentation. He describes the pain as sharp and radiating to his left arm. Pain is constant in nature. No additional symptoms. No alleviating or worsening factors. He has a history of hypertension. He does use cocaine, last use was three days ago. He also drinks alcohol, last alcohol was yesterday. Denies tobacco use.    Past Medical History:  Diagnosis Date   Cocaine abuse (HCC)    Complication of anesthesia    H/O ventricular fibrillation 2016   s/p cocaine use   High cholesterol    Hypertension    MI (mitral incompetence)    Myocardial infarction (HCC) 2016; ?06/2017   "both related to cocaine" (03/18/2018)   Nonobstructive atherosclerosis of coronary artery 2016   LHC showed normal EF and mild non-obstructive CAD    Patient Active Problem List   Diagnosis Date Noted   Cocaine use    AKI (acute kidney injury) (HCC) 07/07/2017   Ischemia, myocardial, acute (HCC) 07/06/2017   Elevated troponin    Cocaine adverse reaction 02/23/2017   Precordial chest pain 02/22/2017   Mild CAD 02/22/2017   NSTEMI (non-ST elevated myocardial infarction) (HCC) 05/15/2015   Cocaine abuse (HCC) 05/15/2015   Chest pain at rest    Cardiac arrest (HCC) 05/10/2015   Hyperlipidemia 05/10/2015   Essential hypertension 01/10/2009   SHOULDER PAIN, RIGHT 01/10/2009   CERVICALGIA 01/10/2009   LOW BACK PAIN 01/10/2009    Past Surgical History:  Procedure Laterality Date   CARDIAC CATHETERIZATION N/A 05/10/2015   Procedure: Left  Heart Cath and Coronary Angiography;  Surgeon: Dolores Patty, MD;  Location: [facility];  Service: Cardiovascular;  Laterality: N/A;   LEFT HEART CATH AND CORONARY ANGIOGRAPHY N/A 12/27/2017   Procedure: LEFT HEART CATH AND CORONARY ANGIOGRAPHY;  Surgeon: Lyn Records, MD;  Location: [facility] CV [facility];  Service: Cardiovascular;  Laterality: N/A;   TONSILLECTOMY         Family History  Problem Relation Age of Onset   Hypertension Father    CAD Father        s/p CABG   Heart disease Mother    Heart disease Maternal Grandfather     Social History   Tobacco Use   Smoking status: Never   Smokeless tobacco: Never  Vaping Use   Vaping Use: Never used  Substance Use Topics   Alcohol use: Yes    Alcohol/week: 13.0 standard drinks    Types: 13 Cans of beer per week   Drug use: Yes    Types: Cocaine    Home Medications Prior to Admission medications   Medication Sig Start Date End Date Taking? Authorizing Provider  amLODipine (NORVASC) 5 MG tablet Take 1 tablet (5 mg total) by mouth daily. 08/26/20 10/25/20  Sabino Dick, DO  amLODipine (NORVASC) 5 MG tablet TAKE 1 TABLET (5 MG TOTAL) BY MOUTH DAILY. 08/26/20 08/26/21  Sabino Dick, DO  amLODipine (NORVASC) 5 MG tablet Take 1 tablet (5 mg total) by mouth daily. 11/14/20  Derwood Kaplan, MD  aspirin EC 81 MG tablet Take 1 tablet (81 mg total) by mouth daily. 03/20/18   Hongalgi, Maximino Greenland, MD  atorvastatin (LIPITOR) 40 MG tablet Take 1 tablet (40 mg total) by mouth daily. 08/26/20   Sabino Dick, DO  folic acid (FOLVITE) 1 MG tablet TAKE 1 TABLET (1 MG TOTAL) BY MOUTH DAILY. 08/26/20 08/26/21  Sabino Dick, DO  isosorbide mononitrate (IMDUR) 30 MG 24 hr tablet Take 1 tablet (30 mg total) by mouth daily. 08/26/20 10/25/20  Sabino Dick, DO  isosorbide mononitrate (IMDUR) 30 MG 24 hr tablet TAKE 1 TABLET (30 MG TOTAL) BY MOUTH DAILY. 08/26/20 08/26/21  Sabino Dick, DO  isosorbide mononitrate  (IMDUR) 30 MG 24 hr tablet Take 1 tablet (30 mg total) by mouth daily. 11/14/20   Derwood Kaplan, MD  losartan (COZAAR) 25 MG tablet Take 1 tablet (25 mg total) by mouth daily. 08/26/20 10/25/20  Sabino Dick, DO  losartan (COZAAR) 25 MG tablet TAKE 1 TABLET (25 MG TOTAL) BY MOUTH DAILY. 08/26/20 08/26/21  Sabino Dick, DO  nitroGLYCERIN (NITROSTAT) 0.4 MG SL tablet Place 1 tablet (0.4 mg total) under the tongue every 5 (five) minutes as needed for chest pain (for 3 doses). 08/26/20   Sabino Dick, DO  nitroGLYCERIN (NITROSTAT) 0.4 MG SL tablet PLACE 1 TABLET (0.4 MG TOTAL) UNDER THE TONGUE EVERY FIVE MINUTES AS NEEDED FOR CHEST PAIN (FOR 3 DOSES). 08/26/20 08/26/21  Sabino Dick, DO  thiamine 100 MG tablet Take 1 tablet (100 mg total) by mouth daily. 08/26/20   Sabino Dick, DO  thiamine 100 MG tablet TAKE 1 TABLET (100 MG TOTAL) BY MOUTH DAILY. 08/26/20 08/26/21  Sabino Dick, DO    Allergies    Propoxyphene n-acetaminophen  Review of Systems   Review of Systems  All other systems reviewed and are negative.  Physical Exam Updated Vital Signs BP (!) 138/96 (BP Location: Left Arm)   Pulse 77   Temp 98.2 F (36.8 C) (Oral)   Resp 16   SpO2 95%   Physical Exam Vitals and nursing note reviewed.  Constitutional:      Appearance: He is well-developed.     Comments: No acute distress. Sleeping but easily awoken  HENT:     Head: Normocephalic and atraumatic.  Cardiovascular:     Rate and Rhythm: Normal rate and regular rhythm.     Heart sounds: No murmur heard. Pulmonary:     Effort: Pulmonary effort is normal. No respiratory distress.     Breath sounds: Normal breath sounds.  Abdominal:     Palpations: Abdomen is soft.     Tenderness: There is no abdominal tenderness. There is no guarding or rebound.  Musculoskeletal:        General: No tenderness.     Comments: 2+ radial pulses bilaterally. No lower extremity edema or tenderness  Skin:     General: Skin is warm and dry.  Neurological:     Mental Status: He is oriented to person, place, and time.  Psychiatric:        Behavior: Behavior normal.    ED Results / Procedures / Treatments   Labs (all labs ordered are listed, but only abnormal results are displayed) Labs Reviewed  BASIC METABOLIC PANEL  CBC  TROPONIN I (HIGH SENSITIVITY)    EKG EKG Interpretation  Date/Time:  Friday December 06 2020 07:41:22 EDT Ventricular Rate:  76 PR Interval:  166 QRS Duration: 76 QT Interval:  374 QTC Calculation: 420 R Axis:  13 Text Interpretation: Normal sinus rhythm Normal ECG Confirmed by Tilden Fossa [phone]) on 12/06/2020 8:13:34 AM  Radiology No results found.  Procedures Procedures CRITICAL CARE Performed by: Tilden Fossa   Total critical care time: 35 minutes  Critical care time was exclusive of separately billable procedures and treating other patients.  Critical care was necessary to treat or prevent imminent or life-threatening deterioration.  Critical care was time spent personally by me on the following activities: development of treatment plan with patient and/or surrogate as well as nursing, discussions with consultants, evaluation of patient's response to treatment, examination of patient, obtaining history from patient or surrogate, ordering and performing treatments and interventions, ordering and review of laboratory studies, ordering and review of radiographic studies, pulse oximetry and re-evaluation of patient's condition.   Medications Ordered in ED Medications - No data to display  ED Course  I have reviewed the triage vital signs and the nursing notes.  Pertinent labs & imaging results that were available during my care of the patient were reviewed by me and considered in my medical decision making (see chart for details).    MDM Rules/Calculators/A&P                         patient with history of coronary artery disease, cocaine abuse  here for evaluation of chest pain that started today. EKG is without acute ischemic changes. Initial troponin is negative and repeat troponin is elevated at 78. Discussed with on-call cardiologist, recommends medicine admission for serial troponins and echo. Hospitalist  consulted for admission.  Final Clinical Impression(s) / ED Diagnoses Final diagnoses:  None    Rx / DC Orders ED Discharge Orders     None        Tilden Fossa, MD 12/06/20 1520

## 2020-12-06 NOTE — Progress Notes (Signed)
ANTICOAGULATION CONSULT NOTE - Initial Consult  Pharmacy Consult for heparin Indication: chest pain/ACS  Allergies  Allergen Reactions   Propoxyphene N-Acetaminophen Nausea Only and Other (See Comments)    GI upset    Patient Measurements: Height: 5\' 11"  (180.3 cm) Weight: 77.1 kg (169 lb 15.6 oz) IBW/kg (Calculated) : 75.3 Heparin Dosing Weight: 77.1 kg  Vital Signs: Temp: 98.2 F (36.8 C) (06/24 0743) Temp Source: Oral (06/24 0743) BP: 149/105 (06/24 1245) Pulse Rate: 69 (06/24 1245)  Labs: Recent Labs    12/06/20 0748 12/06/20 0945  HGB 16.7  --   HCT 51.5  --   PLT 310  --   CREATININE 1.10  --   TROPONINIHS 11 78*    Estimated Creatinine Clearance: 77 mL/min (by C-G formula based on SCr of 1.1 mg/dL).  Assessment: 60 yo M that presents to ED with central chest pain for 1 hour prior to arrival. Sharp pain radiating down to his left arm. Hx of HTN, cocaine abuse (last used 3 days ago), HLD, and non-obstructive CAD  Goal of Therapy:  Heparin level 0.3-0.7 units/ml Monitor platelets by anticoagulation protocol: Yes   Plan:  Give 4000 units bolus x 1 Start heparin infusion at 1000 units/hr --Monitor daily HL, CBC, and any s/sx of bleeding --f/u 6h heparin level  46, PharmD, BCCCP Emergency Medicine Clinical Pharmacist  Please check AMION for all [facility] phone numbers After 10:00 PM, call Main Pharmacy [phone]

## 2020-12-06 NOTE — ED Notes (Signed)
Tele tracking entered for pt, no pending transports at this time.

## 2020-12-06 NOTE — Progress Notes (Signed)
Date and time results received: 12/06/20 1840 (use smartphrase ".now" to insert current time)  Test: troponin Critical Value: 105  Name of Provider Notified: Dr. Katrinka Blazing  Orders Received? Or Actions Taken?:  no new orders.

## 2020-12-06 NOTE — Progress Notes (Signed)
  Echocardiogram 2D Echocardiogramwith 3D and strain has been performed.  Leta Jungling M 12/06/2020, 2:34 PM

## 2020-12-07 LAB — PHOSPHORUS: Phosphorus: 4.1 mg/dL (ref 2.5–4.6)

## 2020-12-07 LAB — RAPID URINE DRUG SCREEN, HOSP PERFORMED
Amphetamines: NOT DETECTED
Barbiturates: NOT DETECTED
Benzodiazepines: NOT DETECTED
Cocaine: POSITIVE — AB
Opiates: NOT DETECTED
Tetrahydrocannabinol: NOT DETECTED

## 2020-12-07 LAB — HEPARIN LEVEL (UNFRACTIONATED): Heparin Unfractionated: 0.28 IU/mL — ABNORMAL LOW (ref 0.30–0.70)

## 2020-12-07 LAB — CBC
HCT: 47.9 % (ref 39.0–52.0)
Hemoglobin: 16.1 g/dL (ref 13.0–17.0)
MCH: 31.9 pg (ref 26.0–34.0)
MCHC: 33.6 g/dL (ref 30.0–36.0)
MCV: 94.9 fL (ref 80.0–100.0)
Platelets: 295 10*3/uL (ref 150–400)
RBC: 5.05 MIL/uL (ref 4.22–5.81)
RDW: 13 % (ref 11.5–15.5)
WBC: 7.8 10*3/uL (ref 4.0–10.5)
nRBC: 0 % (ref 0.0–0.2)

## 2020-12-07 LAB — MAGNESIUM: Magnesium: 2.3 mg/dL (ref 1.7–2.4)

## 2020-12-07 LAB — TROPONIN I (HIGH SENSITIVITY): Troponin I (High Sensitivity): 31 ng/L — ABNORMAL HIGH (ref <18)

## 2020-12-07 NOTE — Discharge Summary (Signed)
Physician Discharge Summary  Bryce Ortega OQH:[id number] DOB: 1960/07/06 DOA: 12/06/2020  PCP: Lavinia Sharps, NP  Admit date: 12/06/2020 Discharge date: 12/07/2020  Admitted From: home Disposition:  home  Recommendations for Outpatient Follow-up:  Follow up with PCP in 1-2 weeks Please obtain BMP/CBC in one week   Home Health: no  Equipment/Devices: none   Discharge Condition: stable  CODE STATUS: full  Diet recommendation: Heart Healthy      Discharge Diagnoses: Principal Problem:   Chest pain Active Problems:   Essential hypertension   Hyperlipidemia   Polysubstance abuse (HCC)    Summary of HPI and Hospital Course:  Per H&P by Dr. Katrinka Blazing: "Bryce Ortega is a 60 y.o. male with medical history significant of hypertension, hyperlipidemia, MI, ventricular fibrillation, and polysubstance abuse (including cocaine and alcohol) presents with complaints of left-sided chest pain that started this morning.  He describes it as a sharp left-sided pain that was constant with radiation down his left arm.  Noted associated symptoms of some shortness of breath, cold sweats, and nausea.  Denied having any vomiting, lightheadedness, loss of consciousness.  Patient reports that he last snorted cocaine 2 days ago.  He drinks a 40 ounce of liquor every other day or so, but does not drink on a daily basis and denies any history of withdrawal.  He reports that the chest pain is more superior than previously when he had chest pain symptoms after using cocaine.  He had been seen in the emergency department for the same on 6/2 after recently using cocaine as well cardiac enzymes were negative and symptoms possibly secondary to coronary vasospasm.  Last heart cath revealed widely patent coronaries back in 12/2017.   ED Course: Upon admission into the emergency department patient was seen to be afebrile with blood pressures 138/96-171/110, and all other vital signs maintained.  Labs significant for  high-sensitivity troponin 11->78.  Chest x-ray showed no acute abnormalities.  Case was discussed with cardiology who recommended admission to medicine for cycling of troponins.  Patient was started on a heparin drip per pharmacy. "    Chest pain elevated troponin: Acute.   Patient presented with complaints of left-sided chest pain after recently using cocaine.  High-sensitivity troponins 11->78.   EKG without significant ischemic changes.  Case was discussed with cardiology who suspected symptoms secondary to cocaine use.  He had cardiac catheterization in 2019 that revealed widely patent coronaries.  Patient started on heparin drip and admitted to telemetry bed. Echocardiogram showed EF low normal at 50-55% and grade 1 diastolic dysfunction, no regional wall motion abnormalities to suggest ischemia.   Troponin trend peaked at 105>>31. Tolerating ambulation without symptoms. Chest pain fully resolved. Patient clinically improved and stable for discharge today. Continue daily aspirin.    Essential hypertension: chronic Home regimen:  amlodipine 5 mg daily, Imdur 30 mg daily, and losartan 25 mg daily. -Continue without changes   Dyslipidemia -Continue atorvastatin   Polysubstance abuse: on admission, pt report recently using cocaine 2 days ago, drinking a 40 ounce of liquor every other day, but not on a daily basis.  Last drink was approximately 2 days PTA. EtOH level <10, UDS +cocaine only. Patient was counseled on need of cessation of these.   Monitor by [facility] protocols with as needed Ativan - no significant withdrawal was seen. TOC consulted to provide patient with local resources.   Discharge Instructions    Discharge Instructions     Call MD for:  extreme fatigue  Complete by: As directed    Call MD for:  persistant dizziness or light-headedness   Complete by: As directed    Call MD for:  persistant nausea and vomiting   Complete by: As directed    Call MD for:  severe  uncontrolled pain   Complete by: As directed    Call MD for:  temperature >100.4   Complete by: As directed    Diet - low sodium heart healthy   Complete by: As directed    Increase activity slowly   Complete by: As directed       Allergies as of 12/07/2020       Reactions   Propoxyphene N-acetaminophen Nausea Only, Other (See Comments)   GI upset        Medication List     TAKE these medications    amLODipine 5 MG tablet Commonly known as: NORVASC Take 1 tablet (5 mg total) by mouth daily.   aspirin EC 81 MG tablet Take 1 tablet (81 mg total) by mouth daily.   atorvastatin 40 MG tablet Commonly known as: LIPITOR Take 1 tablet (40 mg total) by mouth daily.   folic acid 1 MG tablet Commonly known as: FOLVITE TAKE 1 TABLET (1 MG TOTAL) BY MOUTH DAILY. What changed: how much to take   isosorbide mononitrate 30 MG 24 hr tablet Commonly known as: IMDUR Take 1 tablet (30 mg total) by mouth daily.   losartan 25 MG tablet Commonly known as: COZAAR TAKE 1 TABLET (25 MG TOTAL) BY MOUTH DAILY. What changed: how much to take   nitroGLYCERIN 0.4 MG SL tablet Commonly known as: NITROSTAT PLACE 1 TABLET (0.4 MG TOTAL) UNDER THE TONGUE EVERY FIVE MINUTES AS NEEDED FOR CHEST PAIN (FOR 3 DOSES). What changed:  how much to take how to take this when to take this reasons to take this   thiamine 100 MG tablet TAKE 1 TABLET (100 MG TOTAL) BY MOUTH DAILY. What changed: how much to take        Allergies  Allergen Reactions   Propoxyphene N-Acetaminophen Nausea Only and Other (See Comments)    GI upset     If you experience worsening of your admission symptoms, develop shortness of breath, life threatening emergency, suicidal or homicidal thoughts you must seek medical attention immediately by calling 911 or calling your MD immediately  if symptoms less severe.    Please note   You were cared for by a hospitalist during your hospital stay. If you have any  questions about your discharge medications or the care you received while you were in the hospital after you are discharged, you can call the unit and asked to speak with the hospitalist on call if the hospitalist that took care of you is not available. Once you are discharged, your primary care physician will handle any further medical issues. Please note that NO REFILLS for any discharge medications will be authorized once you are discharged, as it is imperative that you return to your primary care physician (or establish a relationship with a primary care physician if you do not have one) for your aftercare needs so that they can reassess your need for medications and monitor your lab values.   Consultations: none    Procedures/Studies: DG Chest 2 View  Result Date: 12/06/2020 CLINICAL DATA:  LEFT side chest pain radiating down LEFT arm, recently did cocaine and ethanol EXAM: CHEST - 2 VIEW COMPARISON:  11/14/2020 FINDINGS: Normal heart size, mediastinal contours, and  pulmonary vascularity. Lungs clear. No infiltrate, pleural effusion, or pneumothorax. Osseous structures unremarkable. IMPRESSION: No acute abnormalities. Electronically Signed   By: Ulyses Southward M.D.   On: 12/06/2020 08:24   DG Chest Port 1 View  Result Date: 11/14/2020 CLINICAL DATA:  Chest pain and lethargy EXAM: PORTABLE CHEST 1 VIEW COMPARISON:  August 25, 2020 FINDINGS: The lungs are clear. The heart size and pulmonary vascularity are normal. No adenopathy. No bone lesions. IMPRESSION: Lungs clear.  Heart size normal.  No evident adenopathy. Electronically Signed   By: Bretta Bang III M.D.   On: 11/14/2020 14:44   ECHOCARDIOGRAM COMPLETE  Result Date: 12/06/2020    ECHOCARDIOGRAM REPORT   Patient Name:   ANH MANGANO Date of Exam: 12/06/2020 Medical Rec #:  [id number]        Height:       71.0 in Accession #:    [id number]       Weight:       170.0 lb Date of Birth:  [birth year]        BSA:          1.968 m Patient Age:     59 years         BP:           134/89 mmHg Patient Gender: M                HR:           71 bpm. Exam Location:  Inpatient Procedure: 2D Echo, 3D Echo, Color Doppler, Cardiac Doppler and Strain Analysis Indications:    Chest Pain R07.9  History:        Patient has prior history of Echocardiogram examinations, most                 recent 06/26/2020. Previous Myocardial Infarction; Risk                 Factors:Hypertension and Dyslipidemia. Polysubstsance abuse.                 Nonobstructive atherosclerosis of coronary artery.  Sonographer:    Leta Jungling RDCS Referring Phys: [id number] RONDELL A SMITH IMPRESSIONS  1. Left ventricular ejection fraction, by estimation, is 50 to 55%. The left ventricle has low normal function. The left ventricle has no regional wall motion abnormalities. There is mild left ventricular hypertrophy of the basal-septal segment. Left ventricular diastolic parameters are consistent with Grade I diastolic dysfunction (impaired relaxation). The average left ventricular global longitudinal strain is -13.1 %. The global longitudinal strain is abnormal.  2. Right ventricular systolic function is normal. The right ventricular size is normal.  3. The mitral valve is normal in structure. No evidence of mitral valve regurgitation. No evidence of mitral stenosis.  4. The aortic valve is tricuspid. Aortic valve regurgitation is not visualized. No aortic stenosis is present.  5. The inferior vena cava is normal in size with greater than 50% respiratory variability, suggesting right atrial pressure of 3 mmHg. FINDINGS  Left Ventricle: Left ventricular ejection fraction, by estimation, is 50 to 55%. The left ventricle has low normal function. The left ventricle has no regional wall motion abnormalities. The average left ventricular global longitudinal strain is -13.1 %. The global longitudinal strain is abnormal. The left ventricular internal cavity size was normal in size. There is mild left  ventricular hypertrophy of the basal-septal segment. Left ventricular diastolic parameters are consistent with Grade I diastolic dysfunction (impaired relaxation). Right Ventricle: The right  ventricular size is normal. Right ventricular systolic function is normal. Left Atrium: Left atrial size was normal in size. Right Atrium: Right atrial size was normal in size. Pericardium: There is no evidence of pericardial effusion. Mitral Valve: The mitral valve is normal in structure. No evidence of mitral valve regurgitation. No evidence of mitral valve stenosis. Tricuspid Valve: The tricuspid valve is normal in structure. Tricuspid valve regurgitation is trivial. No evidence of tricuspid stenosis. Aortic Valve: The aortic valve is tricuspid. Aortic valve regurgitation is not visualized. No aortic stenosis is present. Pulmonic Valve: The pulmonic valve was normal in structure. Pulmonic valve regurgitation is not visualized. No evidence of pulmonic stenosis. Aorta: The aortic root is normal in size and structure. Venous: The inferior vena cava is normal in size with greater than 50% respiratory variability, suggesting right atrial pressure of 3 mmHg. IAS/Shunts: No atrial level shunt detected by color flow Doppler.  LEFT VENTRICLE PLAX 2D LVIDd:         5.00 cm  Diastology LVIDs:         3.80 cm  LV e' medial:    4.33 cm/s LV PW:         0.80 cm  LV E/e' medial:  9.0 LV IVS:        0.70 cm  LV e' lateral:   7.12 cm/s LVOT diam:     2.00 cm  LV E/e' lateral: 5.5 LV SV:         32 LV SV Index:   16       2D Longitudinal Strain LVOT Area:     3.14 cm 2D Strain GLS Avg:     -13.1 %  RIGHT VENTRICLE RV S prime:     10.80 cm/s TAPSE (M-mode): 1.3 cm LEFT ATRIUM             Index      RIGHT ATRIUM          Index LA diam:        2.80 cm 1.42 cm/m RA Area:     8.91 cm LA Vol (A2C):   15.4 ml 7.83 ml/m RA Volume:   14.00 ml 7.11 ml/m LA Vol (A4C):   17.6 ml 8.94 ml/m LA Biplane Vol: 16.6 ml 8.44 ml/m  AORTIC VALVE LVOT Vmax:    70.10 cm/s LVOT Vmean:  43.100 cm/s LVOT VTI:    0.102 m  AORTA Ao Root diam: 3.10 cm Ao Asc diam:  3.60 cm MITRAL VALVE MV Area (PHT): 2.33 cm    SHUNTS MV Decel Time: 325 msec    Systemic VTI:  0.10 m MV E velocity: 39.00 cm/s  Systemic Diam: 2.00 cm MV A velocity: 66.90 cm/s MV E/A ratio:  0.58 Olga Millers MD Electronically signed by Olga Millers MD Signature Date/Time: 12/06/2020/2:48:18 PM    Final     Echo (as above)    Subjective: Pt feels better today. Denies chest pain.  Ambulating around room, hallway, no CP or SOB.  No other acute complaints.    Discharge Exam: Vitals:   12/07/20 0555 12/07/20 0745  BP: (!) 145/97   Pulse: 65   Resp:  20  Temp:  (!) 97.2 F (36.2 C)  SpO2:  95%   Vitals:   12/07/20 0432 12/07/20 0435 12/07/20 0555 12/07/20 0745  BP: (!) 127/95 122/81 (!) 145/97   Pulse: 60  65   Resp: 15   20  Temp: 98.2 F (36.8 C)   (!) 97.2 F (36.2  C)  TempSrc: Oral   Oral  SpO2: 97%   95%  Weight:      Height:        General: Pt is alert, awake, not in acute distress Cardiovascular: RRR, S1/S2 +, no rubs, no gallops Respiratory: CTA bilaterally, no wheezing, no rhonchi Abdominal: Soft, NT, ND, bowel sounds + Extremities: no edema, no cyanosis    The results of significant diagnostics from this hospitalization (including imaging, microbiology, ancillary and laboratory) are listed below for reference.     Microbiology: Recent Results (from the past 240 hour(s))  SARS CORONAVIRUS 2 (TAT 6-24 HRS) Nasopharyngeal Nasopharyngeal Swab     Status: None   Collection Time: 12/06/20 12:41 PM   Specimen: Nasopharyngeal Swab  Result Value Ref Range Status   SARS Coronavirus 2 NEGATIVE NEGATIVE Final    Comment: (NOTE) SARS-CoV-2 target nucleic acids are NOT DETECTED.  The SARS-CoV-2 RNA is generally detectable in upper and lower respiratory specimens during the acute phase of infection. Negative results do not preclude SARS-CoV-2 infection, do not rule  out co-infections with other pathogens, and should not be used as the sole basis for treatment or other patient management decisions. Negative results must be combined with clinical observations, patient history, and epidemiological information. The expected result is Negative.  Fact Sheet for Patients: HairSlick.no  Fact Sheet for Healthcare Providers: quierodirigir.com  This test is not yet approved or cleared by the [place] FDA and  has been authorized for detection and/or diagnosis of SARS-CoV-2 by FDA under an Emergency Use Authorization (EUA). This EUA will remain  in effect (meaning this test can be used) for the duration of the COVID-19 declaration under Se ction 564(b)(1) of the Act, 21 U.S.C. section 360bbb-3(b)(1), unless the authorization is terminated or revoked sooner.  Performed at [facility], [address] [place], [place] [address]      Labs: BNP (last 3 results) Recent Labs    06/26/20 1034  BNP 97.7   Basic Metabolic Panel: Recent Labs  Lab 12/06/20 0748 12/07/20 0224  NA 135  --   K 3.6  --   CL 105  --   CO2 24  --   GLUCOSE 125*  --   BUN 14  --   CREATININE 1.10  --   CALCIUM 8.5*  --   MG  --  2.3  PHOS  --  4.1   Liver Function Tests: No results for input(s): AST, ALT, ALKPHOS, BILITOT, PROT, ALBUMIN in the last 168 hours. No results for input(s): LIPASE, AMYLASE in the last 168 hours. No results for input(s): AMMONIA in the last 168 hours. CBC: Recent Labs  Lab 12/06/20 0748 12/07/20 0224  WBC 7.5 7.8  HGB 16.7 16.1  HCT 51.5 47.9  MCV 97.0 94.9  PLT 310 295   Cardiac Enzymes: No results for input(s): CKTOTAL, CKMB, CKMBINDEX, TROPONINI in the last 168 hours. BNP: Invalid input(s): POCBNP CBG: No results for input(s): GLUCAP in the last 168 hours. D-Dimer No results for input(s): DDIMER in the last 72 hours. Hgb A1c No results for input(s): HGBA1C in  the last 72 hours. Lipid Profile No results for input(s): CHOL, HDL, LDLCALC, TRIG, CHOLHDL, LDLDIRECT in the last 72 hours. Thyroid function studies No results for input(s): TSH, T4TOTAL, T3FREE, THYROIDAB in the last 72 hours.  Invalid input(s): FREET3 Anemia work up No results for input(s): VITAMINB12, FOLATE, FERRITIN, TIBC, IRON, RETICCTPCT in the last 72 hours. Urinalysis    Component Value  Date/Time   COLORURINE YELLOW 09/26/2019 1610   APPEARANCEUR CLEAR 09/26/2019 1610   LABSPEC 1.011 09/26/2019 1610   PHURINE 5.0 09/26/2019 1610   GLUCOSEU 50 (A) 09/26/2019 1610   HGBUR SMALL (A) 09/26/2019 1610   BILIRUBINUR NEGATIVE 09/26/2019 1610   KETONESUR NEGATIVE 09/26/2019 1610   PROTEINUR 30 (A) 09/26/2019 1610   NITRITE NEGATIVE 09/26/2019 1610   LEUKOCYTESUR NEGATIVE 09/26/2019 1610   Sepsis Labs Invalid input(s): PROCALCITONIN,  WBC,  LACTICIDVEN Microbiology Recent Results (from the past 240 hour(s))  SARS CORONAVIRUS 2 (TAT 6-24 HRS) Nasopharyngeal Nasopharyngeal Swab     Status: None   Collection Time: 12/06/20 12:41 PM   Specimen: Nasopharyngeal Swab  Result Value Ref Range Status   SARS Coronavirus 2 NEGATIVE NEGATIVE Final    Comment: (NOTE) SARS-CoV-2 target nucleic acids are NOT DETECTED.  The SARS-CoV-2 RNA is generally detectable in upper and lower respiratory specimens during the acute phase of infection. Negative results do not preclude SARS-CoV-2 infection, do not rule out co-infections with other pathogens, and should not be used as the sole basis for treatment or other patient management decisions. Negative results must be combined with clinical observations, patient history, and epidemiological information. The expected result is Negative.  Fact Sheet for Patients: HairSlick.nohttps://www.fda.gov/media/138098/download  Fact Sheet for Healthcare Providers: quierodirigir.comhttps://www.fda.gov/media/138095/download  This test is not yet approved or cleared by the [place] FDA and  has been authorized for detection and/or diagnosis of SARS-CoV-2 by FDA under an Emergency Use Authorization (EUA). This EUA will remain  in effect (meaning this test can be used) for the duration of the COVID-19 declaration under Se ction 564(b)(1) of the Act, 21 U.S.C. section 360bbb-3(b)(1), unless the authorization is terminated or revoked sooner.  Performed at [facility], [address] [place], [place] [id number]      Time coordinating discharge: Over 30 minutes  SIGNED:   Pennie BanterKelly A Anisia Leija, DO [facility] 12/07/2020, 11:46 AM   If 7PM-7AM, please contact night-coverage [email or web address]

## 2020-12-07 NOTE — Progress Notes (Signed)
ANTICOAGULATION CONSULT NOTE   Pharmacy Consult for heparin Indication: chest pain/ACS  Allergies  Allergen Reactions   Propoxyphene N-Acetaminophen Nausea Only and Other (See Comments)    GI upset    Patient Measurements: Height: 5\' 8"  (172.7 cm) Weight: 71 kg (156 lb 8.4 oz) IBW/kg (Calculated) : 68.4 Heparin Dosing Weight: 77.1 kg  Vital Signs: Temp: 97.2 F (36.2 C) (06/25 0745) Temp Source: Oral (06/25 0745) BP: 145/97 (06/25 0555) Pulse Rate: 65 (06/25 0555)  Labs: Recent Labs    12/06/20 0748 12/06/20 0945 12/06/20 1311 12/06/20 1651 12/06/20 2007 12/07/20 0224 12/07/20 0731  HGB 16.7  --   --   --   --  16.1  --   HCT 51.5  --   --   --   --  47.9  --   PLT 310  --   --   --   --  295  --   APTT  --   --  26  --   --   --   --   LABPROT  --   --  11.9  --   --   --   --   INR  --   --  0.9  --   --   --   --   HEPARINUNFRC  --   --   --   --  0.14*  --  0.28*  CREATININE 1.10  --   --   --   --   --   --   TROPONINIHS 11 78*  --  105*  --   --   --      Estimated Creatinine Clearance: 70 mL/min (by C-G formula based on SCr of 1.1 mg/dL).  Assessment: 60 yo M that presents to ED with central chest pain for 1 hour prior to arrival. Sharp pain radiating down to his left arm. Hx of HTN, cocaine abuse (last used 3 days ago), HLD, and non-obstructive CAD.  Heparin level slightly subtherapeutic at 0.28 this AM. CBC wnl. No issues with bleeding per RN.  Goal of Therapy:  Heparin level 0.3-0.7 units/ml Monitor platelets by anticoagulation protocol: Yes   Plan:  Increase heparin drip rate to 1350 units / hr Heparin level in 6 hours Daily heparin level, CBC, and s/sx of bleeding  46, PharmD, MS, BCPS PGY2 Pharmacy Resident  Please check AMION for all [facility] phone numbers After 10:00 PM, call Main Pharmacy [phone]

## 2020-12-07 NOTE — Care Management (Signed)
1258 12-07-20 Case Manager provided the patient with outpatient substance abuse resources. Patient has a primary care provider (PCP) Heide Spark and he will follow up post hospitalization. Patient will be transported home via [facility]. No further needs from Case Manager

## 2021-02-03 ENCOUNTER — Emergency Department (HOSPITAL_COMMUNITY)
Admission: EM | Admit: 2021-02-03 | Discharge: 2021-02-03 | Disposition: A | Discharge status: Home / Self Care | Payer: Self-pay | Attending: Emergency Medicine | Admitting: Emergency Medicine

## 2021-02-03 ENCOUNTER — Other Ambulatory Visit: Payer: Self-pay

## 2021-02-03 ENCOUNTER — Emergency Department (HOSPITAL_COMMUNITY): Payer: Self-pay

## 2021-02-03 ENCOUNTER — Encounter (HOSPITAL_COMMUNITY): Payer: Self-pay

## 2021-02-03 DIAGNOSIS — Y92410 Unspecified street and highway as the place of occurrence of the external cause: Secondary | ICD-10-CM | POA: Insufficient documentation

## 2021-02-03 DIAGNOSIS — M25522 Pain in left elbow: Secondary | ICD-10-CM | POA: Insufficient documentation

## 2021-02-03 DIAGNOSIS — I10 Essential (primary) hypertension: Secondary | ICD-10-CM | POA: Insufficient documentation

## 2021-02-03 DIAGNOSIS — M542 Cervicalgia: Secondary | ICD-10-CM | POA: Insufficient documentation

## 2021-02-03 DIAGNOSIS — Z955 Presence of coronary angioplasty implant and graft: Secondary | ICD-10-CM | POA: Insufficient documentation

## 2021-02-03 DIAGNOSIS — Z79899 Other long term (current) drug therapy: Secondary | ICD-10-CM | POA: Insufficient documentation

## 2021-02-03 DIAGNOSIS — M25562 Pain in left knee: Secondary | ICD-10-CM | POA: Insufficient documentation

## 2021-02-03 DIAGNOSIS — M25511 Pain in right shoulder: Secondary | ICD-10-CM | POA: Insufficient documentation

## 2021-02-03 DIAGNOSIS — M25561 Pain in right knee: Secondary | ICD-10-CM | POA: Insufficient documentation

## 2021-02-03 DIAGNOSIS — Z7982 Long term (current) use of aspirin: Secondary | ICD-10-CM | POA: Insufficient documentation

## 2021-02-03 DIAGNOSIS — R519 Headache, unspecified: Secondary | ICD-10-CM | POA: Insufficient documentation

## 2021-02-03 MED ORDER — KETOROLAC TROMETHAMINE 15 MG/ML IJ SOLN
15.0000 mg | Freq: Once | INTRAMUSCULAR | Status: AC
  Administered 2021-02-03: 15 mg via INTRAMUSCULAR
  Filled 2021-02-03: qty 1
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
## 2021-02-03 NOTE — ED Notes (Signed)
Pt discharged and ambulated out of the ED without difficulty. 
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
## 2021-02-25 ENCOUNTER — Encounter (HOSPITAL_COMMUNITY): Payer: Self-pay

## 2021-02-25 ENCOUNTER — Other Ambulatory Visit: Payer: Self-pay

## 2021-02-25 ENCOUNTER — Emergency Department (HOSPITAL_COMMUNITY): Payer: Self-pay

## 2021-02-25 ENCOUNTER — Emergency Department (HOSPITAL_COMMUNITY)
Admission: EM | Admit: 2021-02-25 | Discharge: 2021-02-25 | Disposition: A | Discharge status: Home / Self Care | Payer: Self-pay | Attending: Emergency Medicine | Admitting: Emergency Medicine

## 2021-02-25 DIAGNOSIS — I2510 Atherosclerotic heart disease of native coronary artery without angina pectoris: Secondary | ICD-10-CM | POA: Insufficient documentation

## 2021-02-25 DIAGNOSIS — I10 Essential (primary) hypertension: Secondary | ICD-10-CM | POA: Insufficient documentation

## 2021-02-25 DIAGNOSIS — Z7982 Long term (current) use of aspirin: Secondary | ICD-10-CM | POA: Insufficient documentation

## 2021-02-25 DIAGNOSIS — Z955 Presence of coronary angioplasty implant and graft: Secondary | ICD-10-CM | POA: Insufficient documentation

## 2021-02-25 DIAGNOSIS — J40 Bronchitis, not specified as acute or chronic: Secondary | ICD-10-CM | POA: Insufficient documentation

## 2021-02-25 DIAGNOSIS — Z20822 Contact with and (suspected) exposure to covid-19: Secondary | ICD-10-CM | POA: Insufficient documentation

## 2021-02-25 DIAGNOSIS — Z79899 Other long term (current) drug therapy: Secondary | ICD-10-CM | POA: Insufficient documentation

## 2021-02-25 LAB — CBC WITH DIFFERENTIAL/PLATELET
Abs Immature Granulocytes: 0.04 10*3/uL (ref 0.00–0.07)
Basophils Absolute: 0.1 10*3/uL (ref 0.0–0.1)
Basophils Relative: 1 %
Eosinophils Absolute: 0.1 10*3/uL (ref 0.0–0.5)
Eosinophils Relative: 2 %
HCT: 46.9 % (ref 39.0–52.0)
Hemoglobin: 15.6 g/dL (ref 13.0–17.0)
Immature Granulocytes: 1 %
Lymphocytes Relative: 32 %
Lymphs Abs: 2.4 10*3/uL (ref 0.7–4.0)
MCH: 31.8 pg (ref 26.0–34.0)
MCHC: 33.3 g/dL (ref 30.0–36.0)
MCV: 95.5 fL (ref 80.0–100.0)
Monocytes Absolute: 0.6 10*3/uL (ref 0.1–1.0)
Monocytes Relative: 8 %
Neutro Abs: 4.3 10*3/uL (ref 1.7–7.7)
Neutrophils Relative %: 56 %
Platelets: 317 10*3/uL (ref 150–400)
RBC: 4.91 MIL/uL (ref 4.22–5.81)
RDW: 13.2 % (ref 11.5–15.5)
WBC: 7.5 10*3/uL (ref 4.0–10.5)
nRBC: 0 % (ref 0.0–0.2)

## 2021-02-25 LAB — BASIC METABOLIC PANEL
Anion gap: 11 (ref 5–15)
BUN: 13 mg/dL (ref 6–20)
CO2: 21 mmol/L — ABNORMAL LOW (ref 22–32)
Calcium: 8.3 mg/dL — ABNORMAL LOW (ref 8.9–10.3)
Chloride: 105 mmol/L (ref 98–111)
Creatinine, Ser: 1.13 mg/dL (ref 0.61–1.24)
GFR, Estimated: >60 mL/min (ref >60)
Glucose, Bld: 94 mg/dL (ref 70–99)
Potassium: 3.9 mmol/L (ref 3.5–5.1)
Sodium: 137 mmol/L (ref 135–145)

## 2021-02-25 LAB — TROPONIN I (HIGH SENSITIVITY)
Troponin I (High Sensitivity): 7 ng/L (ref <18)
Troponin I (High Sensitivity): 7 ng/L (ref <18)

## 2021-02-25 LAB — RESP PANEL BY RT-PCR (FLU A&B, COVID) ARPGX2
Influenza A by PCR: NEGATIVE
Influenza B by PCR: NEGATIVE
SARS Coronavirus 2 by RT PCR: NEGATIVE

## 2021-02-25 LAB — D-DIMER, QUANTITATIVE: D-Dimer, Quant: 0.62 ug/mL-FEU — ABNORMAL HIGH (ref 0.00–0.50)

## 2021-02-25 MED ORDER — AZITHROMYCIN 250 MG PO TABS: 250.0000 mg | ORAL_TABLET | Freq: Every day | ORAL | 0 refills | Status: DC

## 2021-02-25 MED ORDER — IOHEXOL 350 MG/ML SOLN
100.0000 mL | Freq: Once | INTRAVENOUS | Status: AC | PRN
  Administered 2021-02-25: 75 mL via INTRAVENOUS

## 2021-02-25 NOTE — ED Triage Notes (Addendum)
Pt complains of SHOB and chest pain x 2 hrs. Pt states that he took 1 Nitro and the pain did not go away. Pt walked to the hospital. Pt reports drinking a couple of beers today.

## 2021-02-25 NOTE — ED Provider Notes (Signed)
[facility]-EMERGENCY DEPT Provider Note   CSN: [id number] Arrival date & time: 02/25/21  0510     History Chief Complaint  Patient presents with   Shortness of Breath   Chest Pain    Bryce Ortega is a 60 y.o. male.   Shortness of Breath Associated symptoms: chest pain   Associated symptoms: no abdominal pain, no cough, no ear pain, no fever, no rash, no sore throat and no vomiting   Chest Pain Associated symptoms: shortness of breath   Associated symptoms: no abdominal pain, no back pain, no cough, no fever, no palpitations and no vomiting    60 year old male with a history of cocaine abuse, history of V. 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  The patient states that he had alcoholic beverages earlier this evening.  He presents somnolent but not overly intoxicated.  He states that he has had mild left-sided chest pain that he describes as sharp and with mild associated shortness of breath.  Pain has been present for the past 3 hours.  He did take 1 nitroglycerin which did not resolve his discomfort.  He denies any fevers or chills, cough, abdominal pain, nausea or vomiting.  The patient states that he last used cocaine 3 days ago.  Past Medical History:  Diagnosis Date   Cocaine abuse (HCC)    Complication of anesthesia    H/O ventricular fibrillation 2016   s/p cocaine use   High cholesterol    Hypertension    MI (mitral incompetence)    Myocardial infarction (HCC) 2016; ?06/2017   "both related to cocaine" (03/18/2018)   Nonobstructive atherosclerosis of coronary artery 2016   LHC showed normal EF and mild non-obstructive CAD    Patient Active Problem List   Diagnosis Date Noted   Chest pain 12/06/2020   Polysubstance abuse (HCC) 12/06/2020   Cocaine use    AKI (acute kidney injury) (HCC) 07/07/2017   Ischemia,  myocardial, acute (HCC) 07/06/2017   Elevated troponin    Cocaine adverse reaction 02/23/2017   Precordial chest pain 02/22/2017   Mild CAD 02/22/2017   NSTEMI (non-ST elevated myocardial infarction) (HCC) 05/15/2015   Cocaine abuse (HCC) 05/15/2015   Chest pain at rest    Cardiac arrest (HCC) 05/10/2015   Hyperlipidemia 05/10/2015   Essential hypertension 01/10/2009   SHOULDER PAIN, RIGHT 01/10/2009   CERVICALGIA 01/10/2009   LOW BACK PAIN 01/10/2009    Past Surgical History:  Procedure Laterality Date   CARDIAC CATHETERIZATION N/A 05/10/2015   Procedure: Left Heart Cath and Coronary Angiography;  Surgeon: Dolores Patty, MD;  Location: [facility] INVASIVE CV LAB;  Service: Cardiovascular;  Laterality: N/A;   LEFT HEART CATH AND CORONARY ANGIOGRAPHY N/A 12/27/2017   Procedure: LEFT HEART CATH AND CORONARY ANGIOGRAPHY;  Surgeon: Lyn Records, MD;  Location: [facility] INVASIVE CV LAB;  Service: Cardiovascular;  Laterality: N/A;   TONSILLECTOMY         Family History  Problem Relation Age of Onset   Hypertension Father    CAD Father        s/p CABG   Heart disease Mother    Heart disease Maternal Grandfather     Social History   Tobacco Use   Smoking status: Never   Smokeless tobacco: Never  Vaping Use   Vaping Use: Never used  Substance Use Topics   Alcohol  use: Yes    Alcohol/week: 13.0 standard drinks    Types: 13 Cans of beer per week   Drug use: Yes    Types: Cocaine    Home Medications Prior to Admission medications   Medication Sig Start Date End Date Taking? Authorizing Provider  amLODipine (NORVASC) 5 MG tablet Take 1 tablet (5 mg total) by mouth daily. 11/14/20  Yes Derwood Kaplan, MD  aspirin EC 81 MG tablet Take 1 tablet (81 mg total) by mouth daily. 03/20/18  Yes Hongalgi, Maximino Greenland, MD  atorvastatin (LIPITOR) 40 MG tablet Take 1 tablet (40 mg total) by mouth daily. 08/26/20  Yes Espinoza, Myrlene Broker, DO  azithromycin (ZITHROMAX) 250 MG tablet Take 1 tablet (250 mg  total) by mouth daily. Take first 2 tablets together, then 1 every day until finished. 02/25/21  Yes Ernie Avena, MD  isosorbide mononitrate (IMDUR) 30 MG 24 hr tablet Take 1 tablet (30 mg total) by mouth daily. 11/14/20  Yes Nanavati, Ankit, MD  losartan (COZAAR) 50 MG tablet Take 50 mg by mouth daily. 01/15/21  Yes [provider]  nitroGLYCERIN (NITROSTAT) 0.4 MG SL tablet PLACE 1 TABLET (0.4 MG TOTAL) UNDER THE TONGUE EVERY FIVE MINUTES AS NEEDED FOR CHEST PAIN (FOR 3 DOSES). Patient taking differently: Place 0.4 mg under the tongue every 5 (five) minutes x 3 doses as needed for chest pain. 08/26/20 08/26/21 Yes Espinoza, Myrlene Broker, DO  folic acid (FOLVITE) 1 MG tablet TAKE 1 TABLET (1 MG TOTAL) BY MOUTH DAILY. Patient not taking: Reported on 02/25/2021 08/26/20 08/26/21  Sabino Dick, DO  losartan (COZAAR) 25 MG tablet TAKE 1 TABLET (25 MG TOTAL) BY MOUTH DAILY. Patient not taking: No sig reported 08/26/20 08/26/21  Sabino Dick, DO  thiamine 100 MG tablet TAKE 1 TABLET (100 MG TOTAL) BY MOUTH DAILY. Patient not taking: Reported on 02/25/2021 08/26/20 08/26/21  Sabino Dick, DO    Allergies    Propoxyphene n-acetaminophen  Review of Systems   Review of Systems  Constitutional:  Negative for chills and fever.  HENT:  Negative for ear pain and sore throat.   Eyes:  Negative for pain and visual disturbance.  Respiratory:  Positive for shortness of breath. Negative for cough.   Cardiovascular:  Positive for chest pain. Negative for palpitations.  Gastrointestinal:  Negative for abdominal pain and vomiting.  Genitourinary:  Negative for dysuria and hematuria.  Musculoskeletal:  Negative for arthralgias and back pain.  Skin:  Negative for color change and rash.  Neurological:  Negative for seizures and syncope.  All other systems reviewed and are negative.  Physical Exam Updated Vital Signs BP (!) 137/99   Pulse 66   Temp 97.8 F (36.6 C)   Resp 17   Ht 5\' 10"   (1.778 m)   Wt 74.8 kg   SpO2 94%   BMI 23.68 kg/m   Physical Exam Vitals and nursing note reviewed.  Constitutional:      Appearance: He is well-developed.  HENT:     Head: Normocephalic and atraumatic.  Eyes:     Conjunctiva/sclera: Conjunctivae normal.  Cardiovascular:     Rate and Rhythm: Normal rate and regular rhythm.     Heart sounds: No murmur heard. Pulmonary:     Effort: Pulmonary effort is normal. No respiratory distress.     Breath sounds: Normal breath sounds.  Chest:     Comments: Tenderness palpation of the left chest wall that reproduces the patient's pain Abdominal:     Palpations: Abdomen is soft.  Tenderness: There is no abdominal tenderness.  Musculoskeletal:     Cervical back: Neck supple.  Skin:    General: Skin is warm and dry.  Neurological:     Mental Status: He is alert.    ED Results / Procedures / Treatments   Labs (all labs ordered are listed, but only abnormal results are displayed) Labs Reviewed  BASIC METABOLIC PANEL - Abnormal; Notable for the following components:      Result Value   CO2 21 (*)    Calcium 8.3 (*)    All other components within normal limits  D-DIMER, QUANTITATIVE - Abnormal; Notable for the following components:   D-Dimer, Quant 0.62 (*)    All other components within normal limits  RESP PANEL BY RT-PCR (FLU A&B, COVID) [id number]  CBC WITH DIFFERENTIAL/PLATELET  TROPONIN I (HIGH SENSITIVITY)  TROPONIN I (HIGH SENSITIVITY)    EKG EKG Interpretation  Date/Time:  Tuesday February 25 2021 05:20:25 EDT Ventricular Rate:  87 PR Interval:  156 QRS Duration: 76 QT Interval:  381 QTC Calculation: 459 R Axis:   52 Text Interpretation: Sinus rhythm Confirmed by Nicanor Alcon, April ([id number]) on 02/25/2021 5:24:40 AM  Radiology CT Angio Chest PE W and/or Wo Contrast  Result Date: 02/25/2021 CLINICAL DATA:  Shortness of breath, chest pain, positive D-dimer EXAM: CT ANGIOGRAPHY CHEST WITH CONTRAST TECHNIQUE: Multidetector  CT imaging of the chest was performed using the standard protocol during bolus administration of intravenous contrast. Multiplanar CT image reconstructions and MIPs were obtained to evaluate the vascular anatomy. CONTRAST:  22mL OMNIPAQUE IOHEXOL 350 MG/ML SOLN COMPARISON:  Same day chest radiograph FINDINGS: Cardiovascular: Satisfactory opacification of the pulmonary arteries to the segmental level. No evidence of pulmonary embolism. Normal heart size. No pericardial effusion. Mediastinum/Nodes: The imaged thyroid is unremarkable. The esophagus is grossly unremarkable. There is no mediastinal, hilar, or axillary lymphadenopathy. Lungs/Pleura: The trachea and central airways are patent. There is mild central bronchial wall thickening. There is dependent subsegmental atelectasis in the right lung base. There is no focal consolidation or pulmonary edema. There is no pleural effusion or pneumothorax. There is no suspicious nodule. Upper Abdomen: The liver is diffusely hypoattenuating consistent with fatty infiltration. The imaged portions of the upper abdominal viscera are otherwise unremarkable. Musculoskeletal: There is no acute osseous abnormality. Review of the MIP images confirms the above findings. IMPRESSION: 1. No evidence of acute pulmonary embolism. 2. Mild central bronchial wall thickening suggesting bronchitis. Otherwise, no evidence of acute cardiopulmonary process. 3. Hepatic steatosis. Electronically Signed   By: Lesia Hausen M.D.   On: 02/25/2021 14:21   DG Chest Portable 1 View  Result Date: 02/25/2021 CLINICAL DATA:  Shortness of breath EXAM: PORTABLE CHEST 1 VIEW COMPARISON:  02/03/2021 FINDINGS: Normal heart size and mediastinal contours. No acute infiltrate or edema. No effusion or pneumothorax. No acute osseous findings. IMPRESSION: No evidence of active disease. Electronically Signed   By: Tiburcio Pea M.D.   On: 02/25/2021 05:37    Procedures Procedures   Medications Ordered in  ED Medications  iohexol (OMNIPAQUE) 350 MG/ML injection 100 mL (75 mLs Intravenous Contrast Given 02/25/21 1342)    ED Course  I have reviewed the triage vital signs and the nursing notes.  Pertinent labs & imaging results that were available during my care of the patient were reviewed by me and considered in my medical decision making (see chart for details).    MDM Rules/Calculators/A&P HEAR Score: 3  60 year old male with a history of cocaine abuse, history of V. 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  The patient states that he had alcoholic beverages earlier this evening.  He presents somnolent but not overly intoxicated.  He states that he has had mild left-sided chest pain that he describes as sharp and with mild associated shortness of breath.  Pain has been present for the past 3 hours.  He did take 1 nitroglycerin which did not resolve his discomfort.  He denies any fevers or chills, cough, abdominal pain, nausea or vomiting.  He last used cocaine 3 days ago.  On arrival, the patient was afebrile, hemodynamically stable, saturating 91% on room air.  Differential diagnosis includes cocaine induced chest pain, pneumothorax, pneumomediastinum, costochondritis, PE, less likely ACS, pneumonia, viral URI or COVID-19.  EKG without acute ischemic changes noted.  Chest x-ray with no evidence of active disease.  No pleural effusion, no pneumothorax, no clear infiltrate or evidence of pulmonary edema with normal heart size.  No evidence of rib fracture.  Of note, the patient is notably hypoxic on room air to 90 to 91%.  He does not have a history of COPD from what I can ascertain through EMR evaluation.  We will send D-dimer and COVID-19 PCR testing to further evaluate for COVID-19 or PE. HEAR score of 3, low concern for ACS, will rule-out with delta  troponins.  Troponins x2 negative, CXR without pulmonary edema and the patient overall appears euvolemic.  CBC without a leukocytosis, anemia.  Patient's D-dimer resulted mildly positive.  CTA imaging was obtained which was negative for acute PE, no evidence of pneumonia.  Patient was found to have central bronchial wall thickening suggestive of bronchitis.  No evidence of other acute cardiopulmonary process.  Given the patient's cough, shortness of breath and mild chest discomfort, we will go ahead and treat the patient with azithromycin.  On multiple reassessments, the patient was found to be saturating well on room air. Stable for discharge home with outpatient management.  Final Clinical Impression(s) / ED Diagnoses Final diagnoses:  Bronchitis    Rx / DC Orders ED Discharge Orders          Ordered    azithromycin (ZITHROMAX) 250 MG tablet  Daily        02/25/21 1431             Ernie Avena, MD 02/25/21 1435

## 2021-03-05 ENCOUNTER — Emergency Department (HOSPITAL_COMMUNITY): Payer: Self-pay

## 2021-03-05 ENCOUNTER — Encounter (HOSPITAL_COMMUNITY): Payer: Self-pay

## 2021-03-05 ENCOUNTER — Other Ambulatory Visit: Payer: Self-pay

## 2021-03-05 ENCOUNTER — Emergency Department (HOSPITAL_COMMUNITY)
Admission: EM | Admit: 2021-03-05 | Discharge: 2021-03-05 | Disposition: A | Discharge status: Home / Self Care | Payer: Self-pay | Attending: Emergency Medicine | Admitting: Emergency Medicine

## 2021-03-05 DIAGNOSIS — J069 Acute upper respiratory infection, unspecified: Secondary | ICD-10-CM | POA: Insufficient documentation

## 2021-03-05 DIAGNOSIS — Z7982 Long term (current) use of aspirin: Secondary | ICD-10-CM | POA: Insufficient documentation

## 2021-03-05 DIAGNOSIS — I2510 Atherosclerotic heart disease of native coronary artery without angina pectoris: Secondary | ICD-10-CM | POA: Insufficient documentation

## 2021-03-05 DIAGNOSIS — R079 Chest pain, unspecified: Secondary | ICD-10-CM

## 2021-03-05 DIAGNOSIS — I10 Essential (primary) hypertension: Secondary | ICD-10-CM | POA: Insufficient documentation

## 2021-03-05 DIAGNOSIS — Z79899 Other long term (current) drug therapy: Secondary | ICD-10-CM | POA: Insufficient documentation

## 2021-03-05 DIAGNOSIS — Z20822 Contact with and (suspected) exposure to covid-19: Secondary | ICD-10-CM | POA: Insufficient documentation

## 2021-03-05 LAB — CBC
HCT: 48.6 % (ref 39.0–52.0)
Hemoglobin: 16.1 g/dL (ref 13.0–17.0)
MCH: 32.4 pg (ref 26.0–34.0)
MCHC: 33.1 g/dL (ref 30.0–36.0)
MCV: 97.8 fL (ref 80.0–100.0)
Platelets: 222 10*3/uL (ref 150–400)
RBC: 4.97 MIL/uL (ref 4.22–5.81)
RDW: 13.4 % (ref 11.5–15.5)
WBC: 5.6 10*3/uL (ref 4.0–10.5)
nRBC: 0 % (ref 0.0–0.2)

## 2021-03-05 LAB — RESP PANEL BY RT-PCR (FLU A&B, COVID) ARPGX2
Influenza A by PCR: NEGATIVE
Influenza B by PCR: NEGATIVE
SARS Coronavirus 2 by RT PCR: NEGATIVE

## 2021-03-05 LAB — BASIC METABOLIC PANEL
Anion gap: 9 (ref 5–15)
BUN: 14 mg/dL (ref 6–20)
CO2: 22 mmol/L (ref 22–32)
Calcium: 8.3 mg/dL — ABNORMAL LOW (ref 8.9–10.3)
Chloride: 107 mmol/L (ref 98–111)
Creatinine, Ser: 1.01 mg/dL (ref 0.61–1.24)
GFR, Estimated: >60 mL/min (ref >60)
Glucose, Bld: 154 mg/dL — ABNORMAL HIGH (ref 70–99)
Potassium: 3.6 mmol/L (ref 3.5–5.1)
Sodium: 138 mmol/L (ref 135–145)

## 2021-03-05 LAB — TROPONIN I (HIGH SENSITIVITY): Troponin I (High Sensitivity): 7 ng/L (ref <18)

## 2021-03-05 MED ORDER — IBUPROFEN 200 MG PO TABS
600.0000 mg | ORAL_TABLET | Freq: Once | ORAL | Status: AC
  Administered 2021-03-05: 600 mg via ORAL
  Filled 2021-03-05: qty 3

## 2021-03-05 MED ORDER — GUAIFENESIN 100 MG/5ML PO SOLN
15.0000 mL | Freq: Once | ORAL | Status: AC
  Administered 2021-03-05: 300 mg via ORAL
  Filled 2021-03-05: qty 20

## 2021-03-05 MED ORDER — ALBUTEROL SULFATE HFA 108 (90 BASE) MCG/ACT IN AERS
8.0000 | INHALATION_SPRAY | Freq: Once | RESPIRATORY_TRACT | Status: AC
  Administered 2021-03-05: 8 via RESPIRATORY_TRACT
  Filled 2021-03-05: qty 6.7

## 2021-03-05 MED ORDER — PREDNISONE 20 MG PO TABS
20.0000 mg | ORAL_TABLET | Freq: Once | ORAL | Status: AC
  Administered 2021-03-05: 20 mg via ORAL
  Filled 2021-03-05: qty 1

## 2021-03-05 NOTE — ED Triage Notes (Signed)
Pt presents with c/o shortness of breath and chest pain that started yesterday. Pt reports the pain is in the center of his chest.

## 2021-03-05 NOTE — ED Provider Notes (Signed)
[facility]-EMERGENCY DEPT Provider Note   CSN: [id number] Arrival date & time: 03/05/21  1058     History Chief Complaint  Patient presents with   Chest Pain   Shortness of Breath    RAEVON BROOM is a 60 y.o. male.  Pt is a 60 yo male with pmh as listed below including cocaine use, nstemi, hyperlipidemia, and HTN presenting for chest pain. Patient admits to chest pain and shortness of breath that started yesterday. States chest pain is sternally located, non radiating, pressure like in nature. Admits to cough. Denies any fevers, chills, nausea, vomiting, or diarrhea.   The history is provided by the patient. No language interpreter was used.  Chest Pain Associated symptoms: cough and shortness of breath   Associated symptoms: no abdominal pain, no back pain, no fever, no palpitations and no vomiting   Shortness of Breath Associated symptoms: chest pain and cough   Associated symptoms: no abdominal pain, no ear pain, no fever, no rash, no sore throat and no vomiting       Past Medical History:  Diagnosis Date   Cocaine abuse (HCC)    Complication of anesthesia    H/O ventricular fibrillation 2016   s/p cocaine use   High cholesterol    Hypertension    MI (mitral incompetence)    Myocardial infarction (HCC) 2016; ?06/2017   "both related to cocaine" (03/18/2018)   Nonobstructive atherosclerosis of coronary artery 2016   LHC showed normal EF and mild non-obstructive CAD    Patient Active Problem List   Diagnosis Date Noted   Chest pain 12/06/2020   Polysubstance abuse (HCC) 12/06/2020   Cocaine use    AKI (acute kidney injury) (HCC) 07/07/2017   Ischemia, myocardial, acute (HCC) 07/06/2017   Elevated troponin    Cocaine adverse reaction 02/23/2017   Precordial chest pain 02/22/2017   Mild CAD 02/22/2017   NSTEMI (non-ST elevated myocardial infarction) (HCC) 05/15/2015   Cocaine abuse (HCC) 05/15/2015   Chest pain at rest    Cardiac arrest  (HCC) 05/10/2015   Hyperlipidemia 05/10/2015   Essential hypertension 01/10/2009   SHOULDER PAIN, RIGHT 01/10/2009   CERVICALGIA 01/10/2009   LOW BACK PAIN 01/10/2009    Past Surgical History:  Procedure Laterality Date   CARDIAC CATHETERIZATION N/A 05/10/2015   Procedure: Left Heart Cath and Coronary Angiography;  Surgeon: Dolores Patty, MD;  Location: [facility] INVASIVE CV LAB;  Service: Cardiovascular;  Laterality: N/A;   LEFT HEART CATH AND CORONARY ANGIOGRAPHY N/A 12/27/2017   Procedure: LEFT HEART CATH AND CORONARY ANGIOGRAPHY;  Surgeon: Lyn Records, MD;  Location: [facility] INVASIVE CV LAB;  Service: Cardiovascular;  Laterality: N/A;   TONSILLECTOMY         Family History  Problem Relation Age of Onset   Hypertension Father    CAD Father        s/p CABG   Heart disease Mother    Heart disease Maternal Grandfather     Social History   Tobacco Use   Smoking status: Never   Smokeless tobacco: Never  Vaping Use   Vaping Use: Never used  Substance Use Topics   Alcohol use: Yes    Alcohol/week: 13.0 standard drinks    Types: 13 Cans of beer per week   Drug use: Yes    Types: Cocaine    Home Medications Prior to Admission medications   Medication Sig Start Date End Date Taking? Authorizing Provider  amLODipine (NORVASC) 5 MG tablet  Take 1 tablet (5 mg total) by mouth daily. 11/14/20   Derwood Kaplan, MD  aspirin EC 81 MG tablet Take 1 tablet (81 mg total) by mouth daily. 03/20/18   Hongalgi, Maximino Greenland, MD  atorvastatin (LIPITOR) 40 MG tablet Take 1 tablet (40 mg total) by mouth daily. 08/26/20   Sabino Dick, DO  azithromycin (ZITHROMAX) 250 MG tablet Take 1 tablet (250 mg total) by mouth daily. Take first 2 tablets together, then 1 every day until finished. 02/25/21   Ernie Avena, MD  folic acid (FOLVITE) 1 MG tablet TAKE 1 TABLET (1 MG TOTAL) BY MOUTH DAILY. Patient not taking: Reported on 02/25/2021 08/26/20 08/26/21  Sabino Dick, DO  isosorbide mononitrate  (IMDUR) 30 MG 24 hr tablet Take 1 tablet (30 mg total) by mouth daily. 11/14/20   Derwood Kaplan, MD  losartan (COZAAR) 25 MG tablet TAKE 1 TABLET (25 MG TOTAL) BY MOUTH DAILY. Patient not taking: No sig reported 08/26/20 08/26/21  Sabino Dick, DO  losartan (COZAAR) 50 MG tablet Take 50 mg by mouth daily. 01/15/21   [provider]  nitroGLYCERIN (NITROSTAT) 0.4 MG SL tablet PLACE 1 TABLET (0.4 MG TOTAL) UNDER THE TONGUE EVERY FIVE MINUTES AS NEEDED FOR CHEST PAIN (FOR 3 DOSES). Patient taking differently: Place 0.4 mg under the tongue every 5 (five) minutes x 3 doses as needed for chest pain. 08/26/20 08/26/21  Sabino Dick, DO  thiamine 100 MG tablet TAKE 1 TABLET (100 MG TOTAL) BY MOUTH DAILY. Patient not taking: Reported on 02/25/2021 08/26/20 08/26/21  Sabino Dick, DO    Allergies    Propoxyphene n-acetaminophen  Review of Systems   Review of Systems  Constitutional:  Negative for chills and fever.  HENT:  Negative for ear pain and sore throat.   Eyes:  Negative for pain and visual disturbance.  Respiratory:  Positive for cough and shortness of breath.   Cardiovascular:  Positive for chest pain. Negative for palpitations.  Gastrointestinal:  Negative for abdominal pain and vomiting.  Genitourinary:  Negative for dysuria and hematuria.  Musculoskeletal:  Negative for arthralgias and back pain.  Skin:  Negative for color change and rash.  Neurological:  Negative for seizures and syncope.  All other systems reviewed and are negative.  Physical Exam Updated Vital Signs BP (!) 154/86 (BP Location: Left Arm)   Pulse 98   Temp 97.8 F (36.6 C) (Oral)   Resp (!) 22   SpO2 96%   Physical Exam Vitals and nursing note reviewed.  Constitutional:      Appearance: He is well-developed.  HENT:     Head: Normocephalic and atraumatic.  Eyes:     Conjunctiva/sclera: Conjunctivae normal.  Cardiovascular:     Rate and Rhythm: Normal rate and regular rhythm.      Heart sounds: No murmur heard. Pulmonary:     Effort: Pulmonary effort is normal. No respiratory distress.     Breath sounds: Normal breath sounds.  Abdominal:     Palpations: Abdomen is soft.     Tenderness: There is no abdominal tenderness.  Musculoskeletal:     Cervical back: Neck supple.  Skin:    General: Skin is warm and dry.  Neurological:     Mental Status: He is alert.    ED Results / Procedures / Treatments   Labs (all labs ordered are listed, but only abnormal results are displayed) Labs Reviewed  BASIC METABOLIC PANEL  CBC  RAPID URINE DRUG SCREEN, HOSP PERFORMED  TROPONIN I (HIGH SENSITIVITY)  EKG EKG Interpretation  Date/Time:  Wednesday March 05 2021 11:08:00 EDT Ventricular Rate:  95 PR Interval:  154 QRS Duration: 73 QT Interval:  341 QTC Calculation: 429 R Axis:   56 Text Interpretation: Sinus rhythm Confirmed by Edwin Dada ([id number]) on 03/05/2021 11:49:35 AM  Radiology No results found.  Procedures Procedures   Medications Ordered in ED Medications  albuterol (VENTOLIN HFA) 108 (90 Base) MCG/ACT inhaler 8 puff (has no administration in time range)  predniSONE (DELTASONE) tablet 20 mg (has no administration in time range)  ibuprofen (ADVIL) tablet 600 mg (has no administration in time range)  guaiFENesin (ROBITUSSIN) 100 MG/5ML solution 300 mg (has no administration in time range)    ED Course  I have reviewed the triage vital signs and the nursing notes.  Pertinent labs & imaging results that were available during my care of the patient were reviewed by me and considered in my medical decision making (see chart for details).    MDM Rules/Calculators/A&P                          12:12 PM 60 yo male with pmh as listed below including cocaine use, nstemi, hyperlipidemia, and HTN presenting for chest pain. Pt is Aox3, no acute distress, afebrile, with stable vitals. On exam pt has equal bilateral breath sounds with no adventitious lung  sounds. Coughing on exam.   The patient's chest pain is not suggestive of pulmonary embolus, cardiac ischemia, aortic dissection, pericarditis, myocarditis, pulmonary embolism, pneumothorax, pneumonia, Zoster, or esophageal perforation, or other serious etiology.  Historically not abrupt in onset, tearing or ripping, pulses symmetric. EKG nonspecific for ischemia/infarction. No dysrhythmias, brugada, WPW, prolonged QT noted. Troponin negative. CXR reviewed. Labs without demonstration of acute pathology unless otherwise noted above. No pneumonia. Covid negative. Medication given for symptomatic management.   Low HEART Score: 0-3 points (0.9-1.7% risk of MACE).] Given the extremely low risk of these diagnoses further testing and evaluation for these possibilities does not appear to be indicated at this time. Patient in no distress and overall condition improved here in the ED. Symptom likely secondary to viral URI. Detailed discussions were had with the patient regarding current findings, and need for close f/u with PCP or on call doctor. The patient has been instructed to return immediately if the symptoms worsen in any way for re-evaluation. Patient verbalized understanding and is in agreement with current care plan. All questions answered prior to discharge.       Final Clinical Impression(s) / ED Diagnoses Final diagnoses:  Chest pain, unspecified type  Viral URI with cough    Rx / DC Orders ED Discharge Orders     None        Franne Forts, DO 03/06/21 0710

## 2021-09-06 ENCOUNTER — Inpatient Hospital Stay (HOSPITAL_COMMUNITY)
Admission: EM | Admit: 2021-09-06 | Discharge: 2021-09-08 | DRG: 281 | Disposition: A | Discharge status: Home / Self Care | Payer: Self-pay | Attending: Internal Medicine | Admitting: Internal Medicine

## 2021-09-06 ENCOUNTER — Other Ambulatory Visit: Payer: Self-pay

## 2021-09-06 ENCOUNTER — Emergency Department (HOSPITAL_COMMUNITY): Payer: Self-pay

## 2021-09-06 DIAGNOSIS — I214 Non-ST elevation (NSTEMI) myocardial infarction: Secondary | ICD-10-CM | POA: Diagnosis present

## 2021-09-06 DIAGNOSIS — Z79899 Other long term (current) drug therapy: Secondary | ICD-10-CM

## 2021-09-06 DIAGNOSIS — I10 Essential (primary) hypertension: Secondary | ICD-10-CM

## 2021-09-06 DIAGNOSIS — I5032 Chronic diastolic (congestive) heart failure: Secondary | ICD-10-CM | POA: Diagnosis present

## 2021-09-06 DIAGNOSIS — Z8249 Family history of ischemic heart disease and other diseases of the circulatory system: Secondary | ICD-10-CM

## 2021-09-06 DIAGNOSIS — Z7982 Long term (current) use of aspirin: Secondary | ICD-10-CM

## 2021-09-06 DIAGNOSIS — E7800 Pure hypercholesterolemia, unspecified: Secondary | ICD-10-CM | POA: Diagnosis present

## 2021-09-06 DIAGNOSIS — I160 Hypertensive urgency: Principal | ICD-10-CM | POA: Diagnosis present

## 2021-09-06 DIAGNOSIS — E785 Hyperlipidemia, unspecified: Secondary | ICD-10-CM

## 2021-09-06 DIAGNOSIS — R079 Chest pain, unspecified: Secondary | ICD-10-CM | POA: Diagnosis present

## 2021-09-06 DIAGNOSIS — I252 Old myocardial infarction: Secondary | ICD-10-CM

## 2021-09-06 DIAGNOSIS — I340 Nonrheumatic mitral (valve) insufficiency: Secondary | ICD-10-CM | POA: Diagnosis present

## 2021-09-06 DIAGNOSIS — Z888 Allergy status to other drugs, medicaments and biological substances status: Secondary | ICD-10-CM

## 2021-09-06 DIAGNOSIS — I2510 Atherosclerotic heart disease of native coronary artery without angina pectoris: Secondary | ICD-10-CM | POA: Diagnosis present

## 2021-09-06 DIAGNOSIS — F1410 Cocaine abuse, uncomplicated: Secondary | ICD-10-CM | POA: Diagnosis present

## 2021-09-06 DIAGNOSIS — I110 Hypertensive heart disease with heart failure: Secondary | ICD-10-CM | POA: Diagnosis present

## 2021-09-06 DIAGNOSIS — Z91199 Patient's noncompliance with other medical treatment and regimen due to unspecified reason: Secondary | ICD-10-CM

## 2021-09-06 LAB — CBC
HCT: 47.3 % (ref 39.0–52.0)
Hemoglobin: 15.7 g/dL (ref 13.0–17.0)
MCH: 31.7 pg (ref 26.0–34.0)
MCHC: 33.2 g/dL (ref 30.0–36.0)
MCV: 95.4 fL (ref 80.0–100.0)
Platelets: 291 10*3/uL (ref 150–400)
RBC: 4.96 MIL/uL (ref 4.22–5.81)
RDW: 13.5 % (ref 11.5–15.5)
WBC: 7.8 10*3/uL (ref 4.0–10.5)
nRBC: 0 % (ref 0.0–0.2)

## 2021-09-06 LAB — TROPONIN I (HIGH SENSITIVITY)
Troponin I (High Sensitivity): 7 ng/L (ref <18)
Troponin I (High Sensitivity): 7 ng/L (ref <18)

## 2021-09-06 LAB — BASIC METABOLIC PANEL
Anion gap: 7 (ref 5–15)
BUN: 14 mg/dL (ref 6–20)
CO2: 23 mmol/L (ref 22–32)
Calcium: 8.2 mg/dL — ABNORMAL LOW (ref 8.9–10.3)
Chloride: 108 mmol/L (ref 98–111)
Creatinine, Ser: 0.95 mg/dL (ref 0.61–1.24)
GFR, Estimated: >60 mL/min (ref >60)
Glucose, Bld: 95 mg/dL (ref 70–99)
Potassium: 4.6 mmol/L (ref 3.5–5.1)
Sodium: 138 mmol/L (ref 135–145)

## 2021-09-06 LAB — CBG MONITORING, ED: Glucose-Capillary: 110 mg/dL — ABNORMAL HIGH (ref 70–99)

## 2021-09-06 MED ORDER — LORAZEPAM 2 MG/ML IJ SOLN
1.0000 mg | Freq: Once | INTRAMUSCULAR | Status: AC
  Administered 2021-09-06: 1 mg via INTRAVENOUS
  Filled 2021-09-06: qty 1

## 2021-09-06 MED ORDER — NITROGLYCERIN 0.4 MG SL SUBL
0.4000 mg | SUBLINGUAL_TABLET | SUBLINGUAL | Status: DC | PRN
  Administered 2021-09-07 (×2): 0.4 mg via SUBLINGUAL
  Filled 2021-09-06 (×2): qty 1

## 2021-09-06 MED ORDER — NITROGLYCERIN 0.4 MG SL SUBL
0.4000 mg | SUBLINGUAL_TABLET | Freq: Once | SUBLINGUAL | Status: AC
  Administered 2021-09-06: 0.4 mg via SUBLINGUAL
  Filled 2021-09-06: qty 1

## 2021-09-06 MED ORDER — ACETAMINOPHEN 650 MG RE SUPP: 650.0000 mg | Freq: Four times a day (QID) | RECTAL | Status: DC | PRN

## 2021-09-06 MED ORDER — ACETAMINOPHEN 325 MG PO TABS
650.0000 mg | ORAL_TABLET | Freq: Four times a day (QID) | ORAL | Status: DC | PRN
Start: 2021-09-06 — End: 2021-09-08
  Administered 2021-09-07: 650 mg via ORAL
  Filled 2021-09-06: qty 2

## 2021-09-06 NOTE — ED Provider Notes (Signed)
?[facility] EMERGENCY DEPARTMENT ?Provider Note ? ? ?CSN: [id number] ?Arrival date & time: 09/06/21  1901 ? ?  ? ?History ? ?Chief Complaint  ?Patient presents with  ? Chest Pain  ? ? ?KIN GALBRAITH is a 61 y.o. male.  Presented to the emergency department with concern for chest pain.  States that he had chest pain this afternoon, was not sudden but did become severe, up to 10 out of 10 in severity.  Lasted for about an hour.  Seem to get better after receiving nitroglycerin.  Now has no pain.  Last cocaine use was a few days ago.  No difficulty breathing, no symptoms right now. ? ?Completed chart review, reviewed last admission, NSTEMI in June 2022, reviewed last LHC, echocardiogram.  Has history of hypertension, hyperlipidemia, V-fib, polysubstance abuse including cocaine.  ? ?HPI ? ?  ? ?Home Medications ?Prior to Admission medications   ?Medication Sig Start Date End Date Taking? Authorizing Provider  ?amLODipine (NORVASC) 5 MG tablet Take 1 tablet (5 mg total) by mouth daily. 11/14/20   Derwood Kaplan, MD  ?aspirin EC 81 MG tablet Take 1 tablet (81 mg total) by mouth daily. 03/20/18   Hongalgi, Maximino Greenland, MD  ?atorvastatin (LIPITOR) 40 MG tablet Take 1 tablet (40 mg total) by mouth daily. 08/26/20   Sabino Dick, DO  ?azithromycin (ZITHROMAX) 250 MG tablet Take 1 tablet (250 mg total) by mouth daily. Take first 2 tablets together, then 1 every day until finished. 02/25/21   Ernie Avena, MD  ?isosorbide mononitrate (IMDUR) 30 MG 24 hr tablet Take 1 tablet (30 mg total) by mouth daily. 11/14/20   Derwood Kaplan, MD  ?losartan (COZAAR) 25 MG tablet TAKE 1 TABLET (25 MG TOTAL) BY MOUTH DAILY. ?Patient not taking: No sig reported 08/26/20 08/26/21  Sabino Dick, DO  ?losartan (COZAAR) 50 MG tablet Take 50 mg by mouth daily. 01/15/21   [provider]  ?nitroGLYCERIN (NITROSTAT) 0.4 MG SL tablet PLACE 1 TABLET (0.4 MG TOTAL) UNDER THE TONGUE EVERY FIVE MINUTES AS NEEDED FOR CHEST  PAIN (FOR 3 DOSES). ?Patient taking differently: Place 0.4 mg under the tongue every 5 (five) minutes x 3 doses as needed for chest pain. 08/26/20 08/26/21  Sabino Dick, DO  ?   ? ?Allergies    ?Propoxyphene n-acetaminophen   ? ?Review of Systems   ?Review of Systems  ?Constitutional:  Negative for chills and fever.  ?HENT:  Negative for ear pain and sore throat.   ?Eyes:  Negative for pain and visual disturbance.  ?Respiratory:  Positive for chest tightness. Negative for cough and shortness of breath.   ?Cardiovascular:  Positive for chest pain. Negative for palpitations.  ?Gastrointestinal:  Negative for abdominal pain and vomiting.  ?Genitourinary:  Negative for dysuria and hematuria.  ?Musculoskeletal:  Negative for arthralgias and back pain.  ?Skin:  Negative for color change and rash.  ?Neurological:  Negative for seizures and syncope.  ?All other systems reviewed and are negative. ? ?Physical Exam ?Updated Vital Signs ?BP (!) 160/107   Pulse 70   Temp 98.8 ?F (37.1 ?C) (Oral)   Resp 16   SpO2 97%  ?Physical Exam ?Vitals and nursing note reviewed.  ?Constitutional:   ?   General: He is not in acute distress. ?   Appearance: He is well-developed.  ?HENT:  ?   Head: Normocephalic and atraumatic.  ?Eyes:  ?   Conjunctiva/sclera: Conjunctivae normal.  ?Cardiovascular:  ?   Rate and Rhythm: Normal  rate and regular rhythm.  ?   Heart sounds: No murmur heard. ?Pulmonary:  ?   Effort: Pulmonary effort is normal. No respiratory distress.  ?   Breath sounds: Normal breath sounds.  ?Abdominal:  ?   Palpations: Abdomen is soft.  ?   Tenderness: There is no abdominal tenderness.  ?Musculoskeletal:     ?   General: No swelling.  ?   Cervical back: Neck supple.  ?Skin: ?   General: Skin is warm and dry.  ?   Capillary Refill: Capillary refill takes less than 2 seconds.  ?Neurological:  ?   Mental Status: He is alert.  ?Psychiatric:     ?   Mood and Affect: Mood normal.  ? ? ?ED Results / Procedures / Treatments    ?Labs ?(all labs ordered are listed, but only abnormal results are displayed) ?Labs Reviewed  ?BASIC METABOLIC PANEL - Abnormal; Notable for the following components:  ?    Result Value  ? Calcium 8.2 (*)   ? All other components within normal limits  ?CBG MONITORING, ED - Abnormal; Notable for the following components:  ? Glucose-Capillary 110 (*)   ? All other components within normal limits  ?CBC  ?TROPONIN I (HIGH SENSITIVITY)  ?TROPONIN I (HIGH SENSITIVITY)  ? ? ?EKG ?EKG Interpretation ? ?Date/Time:  Saturday September 06 2021 25:85:27 EDT ?Ventricular Rate:  73 ?PR Interval:  152 ?QRS Duration: 99 ?QT Interval:  423 ?QTC Calculation: 467 ?R Axis:   39 ?Text Interpretation: Sinus rhythm Probable anteroseptal infarct, old Confirmed by Marianna Fuss ([id number]) on 09/06/2021 9:31:05 PM ? ?Radiology ?DG Chest 2 View ? ?Result Date: 09/06/2021 ?CLINICAL DATA:  Chest pain. EXAM: CHEST - 2 VIEW COMPARISON:  Chest radiograph dated 03/05/2021. FINDINGS: The heart size and mediastinal contours are within normal limits. Both lungs are clear. The visualized skeletal structures are unremarkable. IMPRESSION: No active cardiopulmonary disease. Electronically Signed   By: Elgie Collard M.D.   On: 09/06/2021 19:40   ? ?Procedures ?Procedures  ? ? ?Medications Ordered in ED ?Medications  ?LORazepam (ATIVAN) injection 1 mg (1 mg Intravenous Given 09/06/21 2135)  ?nitroGLYCERIN (NITROSTAT) SL tablet 0.4 mg (0.4 mg Sublingual Given 09/06/21 2132)  ?nitroGLYCERIN (NITROSTAT) SL tablet 0.4 mg (0.4 mg Sublingual Given 09/06/21 2314)  ? ? ?ED Course/ Medical Decision Making/ A&P ?  ?                        ?Medical Decision Making ?Amount and/or Complexity of Data Reviewed ?Labs: ordered. ?Radiology: ordered. ? ?Risk ?Prescription drug management. ?Decision regarding hospitalization. ? ? ?61 year old gentleman presenting to ER after concern for episode of chest pain.  On arrival here patient is pain-free.  His EKG appears unchanged, no  ischemic change noted.  Basic lab noted, no anemia, no electrolyte derangement.  Troponin x2 is within normal limits.  On reassessment patient remains pain-free.  Suspect chest pain may have been related to his cocaine use.  EKG without ischemic change, troponin x2 within normal limits.  Low suspicion for ACS.  Patient had recurrent chest pain, some improvement with Ativan and nitroglycerin.  Still having ongoing chest pain on reassessment, will give additional nitro.  Given recurrent chest pain episodes, I discussed case with cardiology, discussed with Tanenbaum.  He does not feel there is any role for cardiology to be involved at this time given negative enzymes.  Okay with discharge or admission for symptom control.  I reassessed patient again, still  having ongoing discomfort, will admit for symptom management and observation.  Consult to unassigned medicine for admission.  ? ? ?Final Clinical Impression(s) / ED Diagnoses ?Final diagnoses:  ?Chest pain, unspecified type  ?Cocaine abuse (HCC)  ? ? ?Rx / DC Orders ?ED Discharge Orders   ? ? None  ? ?  ? ? ?  ?Milagros Loll, MD ?09/06/21 2321 ? ?

## 2021-09-06 NOTE — ED Triage Notes (Signed)
[facility] reports pt coming from home c/o chest pain. 1 nitro and 4 baby asa with EMS. Pain 2/10 after nitro. ?

## 2021-09-07 ENCOUNTER — Observation Stay (HOSPITAL_COMMUNITY): Payer: Self-pay

## 2021-09-07 ENCOUNTER — Encounter (HOSPITAL_COMMUNITY): Payer: Self-pay | Admitting: Internal Medicine

## 2021-09-07 DIAGNOSIS — I5032 Chronic diastolic (congestive) heart failure: Secondary | ICD-10-CM | POA: Diagnosis present

## 2021-09-07 DIAGNOSIS — I200 Unstable angina: Secondary | ICD-10-CM

## 2021-09-07 DIAGNOSIS — F1410 Cocaine abuse, uncomplicated: Secondary | ICD-10-CM

## 2021-09-07 DIAGNOSIS — R079 Chest pain, unspecified: Secondary | ICD-10-CM

## 2021-09-07 LAB — RAPID URINE DRUG SCREEN, HOSP PERFORMED
Amphetamines: NOT DETECTED
Barbiturates: NOT DETECTED
Benzodiazepines: POSITIVE — AB
Cocaine: POSITIVE — AB
Opiates: NOT DETECTED
Tetrahydrocannabinol: NOT DETECTED

## 2021-09-07 LAB — COMPREHENSIVE METABOLIC PANEL
ALT: 17 U/L (ref 0–44)
AST: 31 U/L (ref 15–41)
Albumin: 3.2 g/dL — ABNORMAL LOW (ref 3.5–5.0)
Alkaline Phosphatase: 52 U/L (ref 38–126)
Anion gap: 3 — ABNORMAL LOW (ref 5–15)
BUN: 15 mg/dL (ref 6–20)
CO2: 24 mmol/L (ref 22–32)
Calcium: 8.5 mg/dL — ABNORMAL LOW (ref 8.9–10.3)
Chloride: 109 mmol/L (ref 98–111)
Creatinine, Ser: 1 mg/dL (ref 0.61–1.24)
GFR, Estimated: >60 mL/min (ref >60)
Glucose, Bld: 101 mg/dL — ABNORMAL HIGH (ref 70–99)
Potassium: 4.9 mmol/L (ref 3.5–5.1)
Sodium: 136 mmol/L (ref 135–145)
Total Bilirubin: 1.3 mg/dL — ABNORMAL HIGH (ref 0.3–1.2)
Total Protein: 5.6 g/dL — ABNORMAL LOW (ref 6.5–8.1)

## 2021-09-07 LAB — CBC WITH DIFFERENTIAL/PLATELET
Abs Immature Granulocytes: 0.06 10*3/uL (ref 0.00–0.07)
Basophils Absolute: 0 10*3/uL (ref 0.0–0.1)
Basophils Relative: 1 %
Eosinophils Absolute: 0.3 10*3/uL (ref 0.0–0.5)
Eosinophils Relative: 4 %
HCT: 48.1 % (ref 39.0–52.0)
Hemoglobin: 15.7 g/dL (ref 13.0–17.0)
Immature Granulocytes: 1 %
Lymphocytes Relative: 31 %
Lymphs Abs: 2.4 10*3/uL (ref 0.7–4.0)
MCH: 31.4 pg (ref 26.0–34.0)
MCHC: 32.6 g/dL (ref 30.0–36.0)
MCV: 96.2 fL (ref 80.0–100.0)
Monocytes Absolute: 0.9 10*3/uL (ref 0.1–1.0)
Monocytes Relative: 12 %
Neutro Abs: 4.1 10*3/uL (ref 1.7–7.7)
Neutrophils Relative %: 51 %
Platelets: 277 10*3/uL (ref 150–400)
RBC: 5 MIL/uL (ref 4.22–5.81)
RDW: 13.6 % (ref 11.5–15.5)
WBC: 7.8 10*3/uL (ref 4.0–10.5)
nRBC: 0 % (ref 0.0–0.2)

## 2021-09-07 LAB — URINALYSIS, COMPLETE (UACMP) WITH MICROSCOPIC
Bacteria, UA: NONE SEEN
Bilirubin Urine: NEGATIVE
Glucose, UA: NEGATIVE mg/dL
Ketones, ur: NEGATIVE mg/dL
Leukocytes,Ua: NEGATIVE
Nitrite: NEGATIVE
Protein, ur: NEGATIVE mg/dL
Specific Gravity, Urine: 1.02 (ref 1.005–1.030)
pH: 5 (ref 5.0–8.0)

## 2021-09-07 LAB — TROPONIN I (HIGH SENSITIVITY)
Troponin I (High Sensitivity): 7 ng/L (ref <18)
Troponin I (High Sensitivity): 165 ng/L (ref <18)
Troponin I (High Sensitivity): 754 ng/L (ref <18)
Troponin I (High Sensitivity): 1755 ng/L (ref <18)

## 2021-09-07 LAB — ECHOCARDIOGRAM COMPLETE
Area-P 1/2: 2.73 cm2
S' Lateral: 2.8 cm

## 2021-09-07 LAB — BRAIN NATRIURETIC PEPTIDE: B Natriuretic Peptide: 35.2 pg/mL (ref 0.0–100.0)

## 2021-09-07 LAB — MAGNESIUM: Magnesium: 2.3 mg/dL (ref 1.7–2.4)

## 2021-09-07 LAB — LIPASE, BLOOD: Lipase: 32 U/L (ref 11–51)

## 2021-09-07 MED ORDER — LORAZEPAM 2 MG/ML IJ SOLN
1.0000 mg | Freq: Four times a day (QID) | INTRAMUSCULAR | Status: DC | PRN
Start: 2021-09-07 — End: 2021-09-08

## 2021-09-07 MED ORDER — ATORVASTATIN CALCIUM 40 MG PO TABS
40.0000 mg | ORAL_TABLET | Freq: Every day | ORAL | Status: DC
Start: 2021-09-07 — End: 2021-09-08
  Administered 2021-09-07 – 2021-09-08 (×2): 40 mg via ORAL
  Filled 2021-09-07 (×2): qty 1

## 2021-09-07 MED ORDER — HEPARIN BOLUS VIA INFUSION
4000.0000 [IU] | Freq: Once | INTRAVENOUS | Status: AC
  Administered 2021-09-07: 4000 [IU] via INTRAVENOUS
  Filled 2021-09-07: qty 4000

## 2021-09-07 MED ORDER — LOSARTAN POTASSIUM 25 MG PO TABS
25.0000 mg | ORAL_TABLET | Freq: Every day | ORAL | Status: DC
  Administered 2021-09-07 – 2021-09-08 (×2): 25 mg via ORAL
  Filled 2021-09-07 (×2): qty 1

## 2021-09-07 MED ORDER — ASPIRIN EC 81 MG PO TBEC
81.0000 mg | DELAYED_RELEASE_TABLET | Freq: Every day | ORAL | Status: DC
  Administered 2021-09-08: 81 mg via ORAL
  Filled 2021-09-07 (×2): qty 1

## 2021-09-07 MED ORDER — ISOSORBIDE MONONITRATE ER 60 MG PO TB24
60.0000 mg | ORAL_TABLET | Freq: Every day | ORAL | Status: DC
  Administered 2021-09-07 – 2021-09-08 (×2): 60 mg via ORAL
  Filled 2021-09-07: qty 1
  Filled 2021-09-07: qty 2

## 2021-09-07 MED ORDER — HYDRALAZINE HCL 20 MG/ML IJ SOLN
10.0000 mg | Freq: Once | INTRAMUSCULAR | Status: AC
  Administered 2021-09-07: 10 mg via INTRAVENOUS
  Filled 2021-09-07: qty 1

## 2021-09-07 MED ORDER — ASPIRIN 325 MG PO TABS
325.0000 mg | ORAL_TABLET | Freq: Once | ORAL | Status: AC
  Administered 2021-09-07: 325 mg via ORAL
  Filled 2021-09-07: qty 1

## 2021-09-07 MED ORDER — ISOSORBIDE MONONITRATE ER 30 MG PO TB24
30.0000 mg | ORAL_TABLET | Freq: Every day | ORAL | Status: DC
Start: 2021-09-07 — End: 2021-09-07

## 2021-09-07 MED ORDER — HEPARIN (PORCINE) 25000 UT/250ML-% IV SOLN
1250.0000 [IU]/h | INTRAVENOUS | Status: DC
  Administered 2021-09-07: 1100 [IU]/h via INTRAVENOUS
  Administered 2021-09-08: 1250 [IU]/h via INTRAVENOUS
  Filled 2021-09-07 (×2): qty 250

## 2021-09-07 MED ORDER — HYDRALAZINE HCL 20 MG/ML IJ SOLN
10.0000 mg | Freq: Four times a day (QID) | INTRAMUSCULAR | Status: DC | PRN
Start: 2021-09-07 — End: 2021-09-08

## 2021-09-07 MED ORDER — AMLODIPINE BESYLATE 5 MG PO TABS
5.0000 mg | ORAL_TABLET | Freq: Every day | ORAL | Status: DC
  Administered 2021-09-07: 5 mg via ORAL
  Filled 2021-09-07: qty 1

## 2021-09-07 MED ORDER — PANTOPRAZOLE SODIUM 40 MG IV SOLR
40.0000 mg | INTRAVENOUS | Status: DC
  Administered 2021-09-07: 40 mg via INTRAVENOUS
  Filled 2021-09-07: qty 10

## 2021-09-07 NOTE — H&P (Addendum)
?History and Physical  ? ? ?PLEASE NOTE THAT [employer]AGON DICT[employer] SOFTWARE WAS USED IN THE CONSTRUCTION OF THIS NOTE. ? ? ?Bryce Ortega ZDG:[id number] DOB: [birth year] DOA: 09/06/2021 ? ?PCP: Lavinia Sharps, NP  ?Patient coming from: home  ? ?I have personally briefly reviewed patient's old medical records in [facility] ? ?Chief Complaint: Chest pain ? ?HPI: Bryce Ortega is a 61 y.o. male with medical history significant for nonobstructive coronary artery disease, cocaine abuse, hypertension, hyperlipidemia, chronic diastolic heart failure, who is admitted to [facility] on 09/06/2021 for further evaluation and management of chest pain after presenting from home  to [facility] ED complaining of such.  ? ?The patient reports an episode of nonradiating substernal chest pressure starting in the late afternoon on 09/06/2021.  Reports that this episode was nonexertional, nonpleuritic, nonpositional, not reproducible with palpation of the anterior chest wall.  Denies any associated shortness of breath, palpitations, diaphoresis, nausea, vomiting, dizziness, presyncope, or syncope.  This episode lasted approximately 5 minutes, before completely resolving following sublingual nitroglycerin x1 at home.   ? ?Denies any recent subjective fever, chills, rigors, or generalized myalgias.  Not associate with any new onset peripheral edema, new onset calf tenderness, or new lower extremity erythema.  No recent cough, wheezing, hemoptysis. Denies any recent trauma, travel, surgical procedures, or periods of prolonged diminished ambulatory status.  ? ?He has a documented history of recurrent cocaine use, although denies any recent recreational drug use to me.  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 ? ?He also has a history of chronic diastolic heart failure, most recent  echocardiogram in June 2022 notable for LVEF 50 to 55%, no focal motion rise, grade 1 diastolic dysfunction, no significant valvular pathology. ? ?Medical history also notable for essential hypertension and hyperlipidemia.  On a daily baby aspirin at home as well as high intensity atorvastatin.  ? ? ? ? ?ED Course:  ? ?While in the emergency department, the patient experienced recurrence of the above substernal chest pressure, with improving intensity of this chest discomfort following first sublingual nitroglycerin administered in the ED. he subsequently experienced resolution of his chest discomfort following second sublingual nitroglycerin in the ED.  He currently denies any residual chest discomfort. ? ?Vital signs in the ED were notable for the following: Afebrile; heart rate 67-77; blood pressure 140/95; respiratory rate 16-19, oxygen saturation 96 to 99% on room air. ? ?Labs were notable for the following: BMP notable for the following: Potassium 4.6, creatinine 0.95.  CBC notable for white cell count of 7800, hemoglobin 15.7.  High-sensitivity troponin I x2 values were both found to be 7. ? ?Imaging and additional notable ED work-up: EKG, in comparison to most recent prior from 03/07/2021 shows sinus rhythm with heart rate 73, normal intervals, nonspecific T wave flattening in aVL, unchanged from most recent prior EKG, will demonstrating no evidence of ST changes, including no evidence of ST elevation.  Chest x-ray shows no evidence of acute cardiopulmonary process, including no evidence of infiltrate, edema, effusion, or pneumothorax. ? ?While in the ED, the following were administered: Sublingual nitroglycerin 0.4 mg x 2, Ativan 1 mg IV x1. ? ?EDP discussed the patient's case with the on-call cardiologist, Dr.Tannebaum, Who recommended symptomatic management of any residual chest discomfort, without any specific additional recommendations for medical management at this time. ? ?Subsequently, the patient was  admitted for overnight  observation for further evaluation management of his presenting chest discomfort. ? ? ?Review of Systems: As per HPI otherwise 10 point review of systems negative.  ? ?Past Medical History:  ?Diagnosis Date  ? Cocaine abuse (HCC)   ? Complication of anesthesia   ? H/O ventricular fibrillation 2016  ? s/p cocaine use  ? High cholesterol   ? Hypertension   ? MI (mitral incompetence)   ? Myocardial infarction [facility]) 2016; ?06/2017  ? "both related to cocaine" (03/18/2018)  ? Nonobstructive atherosclerosis of coronary artery 2016  ? LHC showed normal EF and mild non-obstructive CAD  ? ? ?Past Surgical History:  ?Procedure Laterality Date  ? CARDIAC CATHETERIZATION N/A 05/10/2015  ? Procedure: Left Heart Cath and Coronary Angiography;  Surgeon: Dolores Patty, MD;  Location[facility] INVASIVE CV LAB;  Service: Cardiovascular;  Laterality: N/A;  ? LEFT HEART CATH AND CORONARY ANGIOGRAPHY N/A 12/27/2017  ? Procedure: LEFT HEART CATH AND CORONARY ANGIOGRAPHY;  Surgeon: Lyn Records, MD;  Location: [facility] INVASIVE CV LAB;  Service: Cardiovascular;  Laterality: N/A;  ? TONSILLECTOMY    ? ? ?Social History: ? reports that he has never smoked. He has never used smokeless tobacco. He reports current alcohol use of about 13.0 standard drinks per week. He reports current drug use. Drug: Cocaine. ? ? ?Allergies  ?Allergen Reactions  ? Propoxyphene N-Acetaminophen Nausea Only and Other (See Comments)  ?  GI upset  ? ? ?Family History  ?Problem Relation Age of Onset  ? Hypertension Father   ? CAD Father   ?     s/p CABG  ? Heart disease Mother   ? Heart disease Maternal Grandfather   ? ? ?Family history reviewed and not pertinent  ? ? ?Prior to Admission medications   ?Medication Sig Start Date End Date Taking? Authorizing Provider  ?amLODipine (NORVASC) 5 MG tablet Take 1 tablet (5 mg total) by mouth daily. 11/14/20  Yes Derwood Kaplan, MD  ?aspirin EC 81 MG tablet Take 1 tablet (81 mg total) by mouth daily. 03/20/18   Yes Hongalgi, Maximino Greenland, MD  ?atorvastatin (LIPITOR) 40 MG tablet Take 1 tablet (40 mg total) by mouth daily. 08/26/20  Yes Sabino Dick, DO  ?isosorbide mononitrate (IMDUR) 30 MG 24 hr tablet Take 1 tablet (30 mg total) by mouth daily. 11/14/20  Yes Nanavati, Ankit, MD  ?losartan (COZAAR) 25 MG tablet TAKE 1 TABLET (25 MG TOTAL) BY MOUTH DAILY. 08/26/20 09/06/21 Yes Sabino Dick, DO  ?azithromycin (ZITHROMAX) 250 MG tablet Take 1 tablet (250 mg total) by mouth daily. Take first 2 tablets together, then 1 every day until finished. ?Patient not taking: Reported on 09/06/2021 02/25/21   Ernie Avena, MD  ?losartan (COZAAR) 50 MG tablet Take 50 mg by mouth daily. ?Patient not taking: Reported on 09/06/2021 01/15/21   [provider]  ?nitroGLYCERIN (NITROSTAT) 0.4 MG SL tablet PLACE 1 TABLET (0.4 MG TOTAL) UNDER THE TONGUE EVERY FIVE MINUTES AS NEEDED FOR CHEST PAIN (FOR 3 DOSES). ?Patient taking differently: Place 0.4 mg under the tongue every 5 (five) minutes x 3 doses as needed for chest pain. 08/26/20 09/06/21  Sabino Dick, DO  ? ? ? ?Objective  ? ? ?Physical Exam: ?Vitals:  ? 09/06/21 2245 09/06/21 2300 09/06/21 2315 09/06/21 2330  ?BP: (!) 139/114 (!) 136/106 (!) 150/109 (!) 131/105  ?Pulse: 67 72 66 65  ?Resp: 17 18 17 11   ?Temp:      ?TempSrc:      ?SpO2: 98% 99%  97% 97%  ? ? ?General: appears to be stated age; alert, oriented ?Skin: warm, dry, no rash ?Head:  AT/[place] ?Mouth:  Oral mucosa membranes appear moist, normal dentition ?Neck: supple; trachea midline ?Heart:  RRR; did not appreciate any M/R/G ?Lungs: CTAB, did not appreciate any wheezes, rales, or rhonchi ?Abdomen: + BS; soft, ND, NT ?Vascular: 2+ pedal pulses b/l; 2+ radial pulses b/l ?Extremities: no peripheral edema, no muscle wasting ?Neuro: strength and sensation intact in upper and lower extremities b/l ? ? ?Labs on Admission: I have personally reviewed following labs and imaging studies ? ?CBC: ?Recent Labs  ?Lab 09/06/21 ?1952   ?WBC 7.8  ?HGB 15.7  ?HCT 47.3  ?MCV 95.4  ?PLT 291  ? ?Basic Metabolic Panel: ?Recent Labs  ?Lab 09/06/21 ?1952  ?NA 138  ?K 4.6  ?CL 108  ?CO2 23  ?GLUCOSE 95  ?BUN 14  ?CREATININE 0.95  ?CALCIUM 8.2*  ? ?GFR:

## 2021-09-07 NOTE — Hospital Course (Signed)
Patient is a 61 year old male with a history of nonobstructive CAD, cocaine abuse, hypertension, hyperlipidemia, chronic diastolic CHF presented with chest pain.  Patient reported intermittent episode of off-and-on radiating substernal chest pressure, that started in the late afternoon on the day of admission.  Nonexertional, nonpleuritic, not reproducible, no shortness of breath, palpitations, diaphoresis nausea or vomiting.  No dizziness.  Initial episode lasted approximately 5 minutes and resolved after sublingual nitroglycerin x1 at home.  ? ?In ED, patient had recurrence of the chest pressure and received further sublingual nitroglycerin. ?Troponins x2 negative.  Patient was admitted for further work-up. ?

## 2021-09-07 NOTE — Progress Notes (Signed)
Critical Value = Troponin = 1,755. Secure chat message sent to Dr Isidoro Donning. ?

## 2021-09-07 NOTE — Assessment & Plan Note (Addendum)
-  Presented with intermittent episodes of nonradiating substernal chest pressure, improved with sublingual nitro.  EKG showed no ST-T wave changes suggestive of acute ischemia. ?-Initial troponins x3 7, patient had another episode of chest pain and stat troponin elevated 165. ?-UDS positive for cocaine, benzodiazepine ?-Patient had a prior cardiac cath in 12/2017 which showed widely patent coronary arteries, NSTEMI at that time for was possibly thought to be related to coronary vasospasm in the setting of cocaine use versus atypical Takotsubo stress cardiomyopathy ?-Follow 2D echo, BP control, increased Imdur to 60 mg daily, continue amlodipine, losartan, added hydralazine IV PRN.  Continue aspirin. ?-Discussed with on-call cardiology, Dr. Graciela Husbands, recommended repeating troponin, no need for IV heparin at this time as chest pain likely due to coronary vasospasm, cocaine use.  If repeat troponin trending up, may need further intervention. ?Addendum ?Repeat troponin 754, will await cardiology conditions ? ?

## 2021-09-07 NOTE — Consult Note (Addendum)
?Cardiology Consultation:  ? ?Patient ID: Bryce Ortega ?MRN: [id number]; DOB: Sep 11, 1960 ? ?Admit date: 09/06/2021 ?Date of Consult: 09/07/2021 ? ?PCP:  Marliss Coots, NP ?  ?[facility] Providers ?Cardiologist:  Sinclair Grooms, MD   { ? ? ?Patient Profile:  ? ?Bryce Ortega is a 61 y.o. male with a hx of CAD, cocaine induced VF arrest and MI, cocaine use disorder, mitral regurgitation, and noncompliance who is being seen  ? ?Coronary angiogram 12/2017 showed mild/moderate eccentric disease of mid/distal LAD, patent LCx, moderate ostial disease of LCx, and mid RCA w/ 30% stenosis3/26/2023 for the evaluation of elevated troponin/chest pain at the request of Dr. Tana Coast.  ? ?Admitted 06/2020 with chest pain following cocaine insufflation.  On arrival to the ED ECG with inferior STE and some reciprocal anterior changes.  He then developed polymorphic VT followed by a wide complex regular rhythm captured on ECG, favor AIVR v. Slow VT (a bit fast for AIVR @ 112).  This resolved to normal sinus rhythm without ST deviation. He was paged as STEMI and decided not to activate.  He was evaluated at bedside subsequently where he was having persistent chest pain and inferior ST deviations. Started on nitroglycerin with a contemporaneous COVID swab which was quite upsetting to him.  Unclear if the adrenergic surge from the swab or still in the setting of vasospasm provoked recurrent frequent episodes of fast NSVT (~170), not captured on ECG.  He was given IV magnesium and started on lidocaine infusion at that point. . hsTn peaked at 5418. IV lidocaine was stopped and his rhythm remained stable. It was felt his arrhythmia and EKG changes were related to his cocaine use. No further episodes of chest pain during admission. Follow up echo showed EF of 45-50% with septal and inferior wall hypokinesis, mild LVH. He was weaned from IV nitro and able to ambulate without chest pain. Counseled on the need to abstain from cocaine use  in the future which he voiced understanding.  ?  ?History of Present Illness:  ? ?Bryce Ortega admitted with post cocaine use chest pain with elevated troponin.  These were normal when surveyed 8 hours apart on arrival and then became abnormal approximately 3 hours later and have continued to climb most recently 1410 hrs at 1755. ? ?Reports not taking his medication for past 2 days prior to arrival.  Last use of cocaine on Friday. ? ?DATE TEST EF   ?1/22 Echo  60-65%   ?3/23 Echo   60-65 % Asymmetric LVH ?1.1/0.7 cm  ?     ?     ? ?Date Cr K Hgb  ?3/23 1.00 4.9 15.7  ?      ? ? ?Past Medical History:  ?Diagnosis Date  ? Cocaine abuse ([place])   ? Complication of anesthesia   ? H/O ventricular fibrillation 2016  ? s/p cocaine use  ? High cholesterol   ? Hypertension   ? MI (mitral incompetence)   ? Myocardial infarction [facility]) 2016; ?06/2017  ? "both related to cocaine" (03/18/2018)  ? Nonobstructive atherosclerosis of coronary artery 2016  ? LHC showed normal EF and mild non-obstructive CAD  ? ? ?Past Surgical History:  ?Procedure Laterality Date  ? CARDIAC CATHETERIZATION N/A 05/10/2015  ? Procedure: Left Heart Cath and Coronary Angiography;  Surgeon: Jolaine Artist, MD;  Location: [place];  Service: Cardiovascular;  Laterality: N/A;  ? LEFT HEART CATH AND CORONARY ANGIOGRAPHY N/A 12/27/2017  ? Procedure:  LEFT HEART CATH AND CORONARY ANGIOGRAPHY;  Surgeon: Belva Crome, MD;  Location: [place] [facility];  Service: Cardiovascular;  Laterality: N/A;  ? TONSILLECTOMY    ?  ? ? ? ?Inpatient Medications: ?Scheduled Meds: ? amLODipine  5 mg Oral Daily  ? aspirin EC  81 mg Oral Daily  ? atorvastatin  40 mg Oral Daily  ? heparin  4,000 Units Intravenous Once  ? isosorbide mononitrate  60 mg Oral Daily  ? losartan  25 mg Oral Daily  ? pantoprazole (PROTONIX) IV  40 mg Intravenous Q24H  ? ?Continuous Infusions: ? heparin    ? ?PRN Meds: ?acetaminophen **OR** acetaminophen, hydrALAZINE, LORazepam,  nitroGLYCERIN ? ?Allergies:    ?Allergies  ?Allergen Reactions  ? Propoxyphene N-Acetaminophen Nausea Only and Other (See Comments)  ?  GI upset  ? ? ?Social History:   ?Social History  ? ?Socioeconomic History  ? Marital status: Divorced  ?  Spouse name: Not on file  ? Number of children: Not on file  ? Years of education: Not on file  ? Highest education level: Not on file  ?Occupational History  ? Not on file  ?Tobacco Use  ? Smoking status: Never  ? Smokeless tobacco: Never  ?Vaping Use  ? Vaping Use: Never used  ?Substance and Sexual Activity  ? Alcohol use: Yes  ?  Alcohol/week: 13.0 standard drinks  ?  Types: 13 Cans of beer per week  ? Drug use: Yes  ?  Types: Cocaine  ? Sexual activity: Yes  ?Other Topics Concern  ? Not on file  ?Social History Narrative  ? ** Merged History Encounter **  ?    ? ?Social Determinants of Health  ? ?Financial Resource Strain: Not on file  ?Food Insecurity: Not on file  ?Transportation Needs: Not on file  ?Physical Activity: Not on file  ?Stress: Not on file  ?Social Connections: Not on file  ?Intimate Partner Violence: Not on file  ?  ?Family History:   ?Family History  ?Problem Relation Age of Onset  ? Hypertension Father   ? CAD Father   ?     s/p CABG  ? Heart disease Mother   ? Heart disease Maternal Grandfather   ?  ? ?ROS:  ?Please see the history of present illness.  ?All other ROS reviewed and negative.    ? ?Physical Exam/Data:  ? ?Vitals:  ? 09/07/21 1330 09/07/21 1400 09/07/21 1422 09/07/21 1427  ?BP: 135/82 122/85  121/85  ?Pulse: 89 78  (!) 109  ?Resp: 20 17  18   ?Temp:    97.7 ?F (36.5 ?C)  ?TempSrc:    Oral  ?SpO2: 97% 96%  97%  ?Weight:   72.4 kg   ?Height:   5\' 10"  (1.778 m)   ? ?No intake or output data in the 24 hours ending 09/07/21 1618 ? ?  09/07/2021  ?  2:22 PM 03/05/2021  ? 11:30 AM 02/25/2021  ?  7:38 AM  ?Last 3 Weights  ?Weight (lbs) 159 lb 11.2 oz 170 lb 165 lb  ?Weight (kg) 72.439 kg 77.111 kg 74.844 kg  ?   ?Body mass index is 22.91 kg/m?.   ?General:  Well nourished, well developed, in no acute distress ?HEENT: normal ?Neck: no JVD ?Vascular: No carotid bruits; Distal pulses 2+ bilaterally ?Cardiac:  normal S1, S2; RRR; no murmur  ?Lungs:  clear to auscultation bilaterally, no wheezing, rhonchi or rales  ?Abd: soft, nontender, no hepatomegaly  ?Ext: no  edema ?Musculoskeletal:  No deformities, BUE and BLE strength normal and equal ?Skin: warm and dry  ?Neuro:  CNs 2-12 intact, no focal abnormalities noted ?Psych:  Normal affect  ? ?EKG: 3/26 15:19 personally reviewed and demonstrates: Sinus with no ST elevation ?ECG 3/26 08: 56 sinus with no ST elevation ?ECG 3/25 19: 13 sinus with no ST elevation ?Telemetry:  Telemetry was personally reviewed and demonstrates:  Sinus rhythm  ? ?Relevant CV Studies: ? ?Echo 11/2020 ?Marland Kitchen Left ventricular ejection fraction, by estimation, is 50 to 55%. The  ?left ventricle has low normal function. The left ventricle has no regional  ?wall motion abnormalities. There is mild left ventricular hypertrophy of  ?the basal-septal segment. Left  ?ventricular diastolic parameters are consistent with Grade I diastolic  ?dysfunction (impaired relaxation). The average left ventricular global  ?longitudinal strain is -13.1 %. The global longitudinal strain is  ?abnormal.  ? 2. Right ventricular systolic function is normal. The right ventricular  ?size is normal.  ? 3. The mitral valve is normal in structure. No evidence of mitral valve  ?regurgitation. No evidence of mitral stenosis.  ? 4. The aortic valve is tricuspid. Aortic valve regurgitation is not  ?visualized. No aortic stenosis is present.  ? 5. The inferior vena cava is normal in size with greater than 50%  ?respiratory variability, suggesting right atrial pressure of 3 mmHg.  ? ? ?Cath 12/2017 ?LEFT HEART CATH AND CORONARY ANGIOGRAPHY  ? ?Conclusion ? ?Widely patent coronary arteries.  Coronary anatomy. ?Left main is normal. ?The LAD contains 30 to 40% mid eccentric narrowing  after the second diagonal.  The LAD does not wraparound the apex.  The distal segment demonstrates some evidence of systolic compression. ?The circumflex is widely patent.  The second and third obtuse marginals contained 40% os

## 2021-09-07 NOTE — Assessment & Plan Note (Addendum)
-  On my exam, BP elevated 168/118, patient feeling chest tightness.  Gave hydralazine 10 mg IV x1, increased Imdur to 60 mg daily, continue Norvasc, losartan. ?  ?

## 2021-09-07 NOTE — Assessment & Plan Note (Signed)
-   UDS positive for benzodiazepines, cocaine.  Patient counseled on cocaine cessation.  ?

## 2021-09-07 NOTE — ED Notes (Signed)
Admitting MD at bedside.

## 2021-09-07 NOTE — Assessment & Plan Note (Addendum)
-   Recent echo in 11/2020 had shown EF 50 to 55%, G1 DD ?-Currently euvolemic, continue current management, follow 2D echo ?  ?

## 2021-09-07 NOTE — Plan of Care (Signed)

## 2021-09-07 NOTE — Progress Notes (Addendum)
? ? ? [facility] ?                                                                            ? ? ?Bryce Ortega, is a 61 y.o. male, DOB - 1961-05-20, [id number] ?Admit date - 09/06/2021    ?Outpatient Primary MD for the patient is Placey, [name], NP ? ?LOS - 0  days ? ? ? ?Brief summary  ? ?Patient is a 61 year old male with a history of nonobstructive CAD, cocaine abuse, hypertension, hyperlipidemia, chronic diastolic CHF presented with chest pain.  Patient reported intermittent episode of off-and-on radiating substernal chest pressure, that started in the late afternoon on the day of admission.  Nonexertional, nonpleuritic, not reproducible, no shortness of breath, palpitations, diaphoresis nausea or vomiting.  No dizziness.  Initial episode lasted approximately 5 minutes and resolved after sublingual nitroglycerin x1 at home.  ? ?In ED, patient had recurrence of the chest pressure and received further sublingual nitroglycerin. ?Troponins x2 negative.  Patient was admitted for further work-up. ? ? ?Assessment & Plan  ? ? ?Assessment and Plan: ?* Chest pain ?-Presented with intermittent episodes of nonradiating substernal chest pressure, improved with sublingual nitro.  EKG showed no ST-T wave changes suggestive of acute ischemia. ?-Initial troponins x3 7, patient had another episode of chest pain and stat troponin elevated 165. ?-UDS positive for cocaine, benzodiazepine ?-Patient had a prior cardiac cath in 12/2017 which showed widely patent coronary arteries, NSTEMI at that time for was possibly thought to be related to coronary vasospasm in the setting of cocaine use versus atypical Takotsubo stress cardiomyopathy ?-Follow 2D echo, BP control, increased Imdur to 60 mg daily, continue amlodipine, losartan, added hydralazine IV PRN.  Continue aspirin. ?-Discussed with on-call cardiology, Dr. Graciela Husbands, recommended repeating troponin, no need for IV heparin at this time as chest pain likely due to  coronary vasospasm, cocaine use.  If repeat troponin trending up, may need further intervention. ?Addendum ?Repeat troponin 754, will await cardiology conditions ? ? ?Hypertensive urgency ?-On my exam, BP elevated 168/118, patient feeling chest tightness.  Gave hydralazine 10 mg IV x1, increased Imdur to 60 mg daily, continue Norvasc, losartan. ?  ? ?Chronic diastolic CHF (congestive heart failure) (HCC) ?- Recent echo in 11/2020 had shown EF 50 to 55%, G1 DD ?-Currently euvolemic, continue current management, follow 2D echo ?  ? ?Cocaine abuse (HCC) ?- UDS positive for benzodiazepines, cocaine.  Patient counseled on cocaine cessation.  ? ?Hyperlipidemia ?-Continue statin ?  ? ? ?Code Status: Full CODE STATUS ?DVT Prophylaxis:  SCDs Start: 09/06/21 2336 ? ? ?Level of Care: Level of care: Progressive ?Family Communication:  ?Disposition Plan:     Remains inpatient appropriate: Awaiting further work-up including 2D echo, still having intermittent episodes of chest pain ? ?Procedures:  ? ? ?Consultants:   ?Cardiology ? ?Antimicrobials: None ? ? ?Medications ? ? amLODipine  5 mg Oral Daily  ? aspirin EC  81 mg Oral Daily  ? atorvastatin  40 mg Oral Daily  ? isosorbide mononitrate  60 mg Oral Daily  ? losartan  25 mg Oral Daily  ? pantoprazole (PROTONIX) IV  40 mg Intravenous Q24H  ? ? ? ?Subjective:  ? ?  Burhanuddin Kohlmann was seen and examined today.  BP elevated this morning.  Felt intermittent chest pressure.  No acute shortness of breath, dizziness, lightheadedness, nausea vomiting or abdominal pain.  ? ?Objective:  ? ?Vitals:  ? 09/07/21 0845 09/07/21 0900 09/07/21 1000 09/07/21 1045  ?BP: (!) 175/113 (!) 148/97 124/87 (!) 136/94  ?Pulse: 74 79 76 72  ?Resp: 18 18 16 16   ?Temp:      ?TempSrc:      ?SpO2: 98% 95% 97% 96%  ? ?No intake or output data in the 24 hours ending 09/07/21 1348 ?There were no vitals filed for this visit. ? ? ?Exam ?General: Alert and oriented x 3, NAD ?Cardiovascular: S1 S2 auscultated,  RRR ?Respiratory: Clear to auscultation bilaterally, no wheezing ?Gastrointestinal: Soft, nontender, nondistended, + bowel sounds ?Ext: no pedal edema bilaterally ?Neuro: no new deficits  ?Psych: Normal affect and demeanor, alert and oriented x3  ? ? ?Data Reviewed:  I have personally reviewed following labs  ? ? ?CBC ?Lab Results  ?Component Value Date  ? WBC 7.8 09/07/2021  ? RBC 5.00 09/07/2021  ? HGB 15.7 09/07/2021  ? HCT 48.1 09/07/2021  ? MCV 96.2 09/07/2021  ? MCH 31.4 09/07/2021  ? PLT 277 09/07/2021  ? MCHC 32.6 09/07/2021  ? RDW 13.6 09/07/2021  ? LYMPHSABS 2.4 09/07/2021  ? MONOABS 0.9 09/07/2021  ? EOSABS 0.3 09/07/2021  ? BASOSABS 0.0 09/07/2021  ? ? ? ?Last metabolic panel ?Lab Results  ?Component Value Date  ? NA 136 09/07/2021  ? K 4.9 09/07/2021  ? CL 109 09/07/2021  ? CO2 24 09/07/2021  ? BUN 15 09/07/2021  ? CREATININE 1.00 09/07/2021  ? GLUCOSE 101 (H) 09/07/2021  ? GFRNONAA >60 09/07/2021  ? GFRAA >60 09/26/2019  ? CALCIUM 8.5 (L) 09/07/2021  ? PHOS 4.1 12/07/2020  ? PROT 5.6 (L) 09/07/2021  ? ALBUMIN 3.2 (L) 09/07/2021  ? BILITOT 1.3 (H) 09/07/2021  ? ALKPHOS 52 09/07/2021  ? AST 31 09/07/2021  ? ALT 17 09/07/2021  ? ANIONGAP 3 (L) 09/07/2021  ? ? ?CBG (last 3)  ?Recent Labs  ?  09/06/21 ?1941  ?GLUCAP 110*  ?  ? ? ? ?Radiology Studies: I have personally reviewed the imaging studies  ?DG Chest 2 View ? ?Result Date: 09/06/2021 ? IMPRESSION: No active cardiopulmonary disease. Electronically Signed   By: 09/08/2021 M.D.   On: 09/06/2021 19:40   ? ? ? ? ?09/08/2021 M.D. ?[facility] ?09/07/2021, 1:48 PM ? ?Available via Epic secure chat 7am-7pm ?After 7 pm, please refer to night coverage provider listed on amion. ? ?  ?

## 2021-09-07 NOTE — ED Notes (Signed)
Assumed care at this time

## 2021-09-07 NOTE — Assessment & Plan Note (Addendum)
Continue statin. 

## 2021-09-07 NOTE — Progress Notes (Signed)
ANTICOAGULATION CONSULT NOTE - Follow Up Consult ? ?Pharmacy Consult for Heparin ?Indication: chest pain/ACS ? ?Allergies  ?Allergen Reactions  ? Propoxyphene N-Acetaminophen Nausea Only and Other (See Comments)  ?  GI upset  ? ? ?Patient Measurements: ?Height: 5\' 10"  (177.8 cm) ?Weight: 72.4 kg (159 lb 11.2 oz) ?IBW/kg (Calculated) : 73 ? ?Vital Signs: ?Temp: 97.7 ?F (36.5 ?C) (03/26 1427) ?Temp Source: Oral (03/26 1427) ?BP: 121/85 (03/26 1427) ?Pulse Rate: 109 (03/26 1427) ? ?Labs: ?Recent Labs  ?  09/06/21 ?1952 09/06/21 ?2139 09/07/21 ?0604 09/07/21 ?0942 09/07/21 ?1158 09/07/21 ?1410  ?HGB 15.7  --  15.7  --   --   --   ?HCT 47.3  --  48.1  --   --   --   ?PLT 291  --  277  --   --   --   ?CREATININE 0.95  --  1.00  --   --   --   ?TROPONINIHS 7   < > 7 165* 754* 1,755*  ? < > = values in this interval not displayed.  ? ? ?Estimated Creatinine Clearance: 80.4 mL/min (by C-G formula based on SCr of 1 mg/dL). ? ?Assessment: ?61 year old male to begin heparin for chest pain ? ?Goal of Therapy:  ?Heparin level 0.3-0.7 units/ml ?Monitor platelets by anticoagulation protocol: Yes ?  ?Plan:  ?Heparin 4000 units iv bolus x 1 ?Heparin drip at 1100 units / hr ?Heparin level in 6 hours ?Daily heparin level, CBC ? ?Thank you ?67, PharmD ? ?09/07/2021,3:30 PM ? ? ?

## 2021-09-07 NOTE — ED Notes (Signed)
RN notified MD of increasing troponin, RN questioned if pt should be placed on progressive floor. Awaiting response. Charge RN notified  ?

## 2021-09-07 NOTE — H&P (View-Only) (Signed)
?Cardiology Consultation:  ? ?Patient ID: Bryce Ortega ?MRN: [id number]; DOB: 22-May-1961 ? ?Admit date: 09/06/2021 ?Date of Consult: 09/07/2021 ? ?PCP:  Marliss Coots, NP ?  ?[facility] ?Cardiologist:  Sinclair Grooms, MD   { ? ? ?Patient Profile:  ? ?Bryce Ortega is a 61 y.o. male with a hx of CAD, cocaine induced VF arrest and MI, cocaine use disorder, mitral regurgitation, and noncompliance who is being seen  ? ?Coronary angiogram 12/2017 showed mild/moderate eccentric disease of mid/distal LAD, patent LCx, moderate ostial disease of LCx, and mid RCA w/ 30% stenosis3/26/2023 for the evaluation of elevated troponin/chest pain at the request of Dr. Tana Coast.  ? ?Admitted 06/2020 with chest pain following cocaine insufflation.  On arrival to the ED ECG with inferior STE and some reciprocal anterior changes.  He then developed polymorphic VT followed by a wide complex regular rhythm captured on ECG, favor AIVR v. Slow VT (a bit fast for AIVR @ 112).  This resolved to normal sinus rhythm without ST deviation. He was paged as STEMI and decided not to activate.  He was evaluated at bedside subsequently where he was having persistent chest pain and inferior ST deviations. Started on nitroglycerin with a contemporaneous COVID swab which was quite upsetting to him.  Unclear if the adrenergic surge from the swab or still in the setting of vasospasm provoked recurrent frequent episodes of fast NSVT (~170), not captured on ECG.  He was given IV magnesium and started on lidocaine infusion at that point. . hsTn peaked at 5418. IV lidocaine was stopped and his rhythm remained stable. It was felt his arrhythmia and EKG changes were related to his cocaine use. No further episodes of chest pain during admission. Follow up echo showed EF of 45-50% with septal and inferior wall hypokinesis, mild LVH. He was weaned from IV nitro and able to ambulate without chest pain. Counseled on the need to abstain from cocaine use  in the future which he voiced understanding.  ?  ?History of Present Illness:  ? ?[name] admitted with post cocaine use chest pain with elevated troponin.  These were normal when surveyed 8 hours apart on arrival and then became abnormal approximately 3 hours later and have continued to climb most recently 1410 hrs at 1755. ? ?Reports not taking his medication for past 2 days prior to arrival.  Last use of cocaine on Friday. ? ?DATE TEST EF   ?1/22 Echo  60-65%   ?3/23 Echo   60-65 % Asymmetric LVH ?1.1/0.7 cm  ?     ?     ? ?Date Cr K Hgb  ?3/23 1.00 4.9 15.7  ?      ? ? ?Past Medical History:  ?Diagnosis Date  ? Cocaine abuse ([place])   ? Complication of anesthesia   ? H/O ventricular fibrillation 2016  ? s/p cocaine use  ? High cholesterol   ? Hypertension   ? MI (mitral incompetence)   ? Myocardial infarction [facility]) 2016; ?06/2017  ? "both related to cocaine" (03/18/2018)  ? Nonobstructive atherosclerosis of coronary artery 2016  ? LHC showed normal EF and mild non-obstructive CAD  ? ? ?Past Surgical History:  ?Procedure Laterality Date  ? CARDIAC CATHETERIZATION N/A 05/10/2015  ? Procedure: Left Heart Cath and Coronary Angiography;  Surgeon: Bryce Artist, MD;  Location: [facility];  Service: Cardiovascular;  Laterality: N/A;  ? LEFT HEART CATH AND CORONARY ANGIOGRAPHY N/A 12/27/2017  ? Procedure:  LEFT HEART CATH AND CORONARY ANGIOGRAPHY;  Surgeon: Bryce Crome, MD;  Location: [facility];  Service: Cardiovascular;  Laterality: N/A;  ? TONSILLECTOMY    ?  ? ? ? ?Inpatient Medications: ?Scheduled Meds: ? amLODipine  5 mg Oral Daily  ? aspirin EC  81 mg Oral Daily  ? atorvastatin  40 mg Oral Daily  ? heparin  4,000 Units Intravenous Once  ? isosorbide mononitrate  60 mg Oral Daily  ? losartan  25 mg Oral Daily  ? pantoprazole (PROTONIX) IV  40 mg Intravenous Q24H  ? ?Continuous Infusions: ? heparin    ? ?PRN Meds: ?acetaminophen **OR** acetaminophen, hydrALAZINE, LORazepam,  nitroGLYCERIN ? ?Allergies:    ?Allergies  ?Allergen Reactions  ? Propoxyphene N-Acetaminophen Nausea Only and Other (See Comments)  ?  GI upset  ? ? ?Social History:   ?Social History  ? ?Socioeconomic History  ? Marital status: Divorced  ?  Spouse name: Not on file  ? Number of children: Not on file  ? Years of education: Not on file  ? Highest education level: Not on file  ?Occupational History  ? Not on file  ?Tobacco Use  ? Smoking status: Never  ? Smokeless tobacco: Never  ?Vaping Use  ? Vaping Use: Never used  ?Substance and Sexual Activity  ? Alcohol use: Yes  ?  Alcohol/week: 13.0 standard drinks  ?  Types: 13 Cans of beer per week  ? Drug use: Yes  ?  Types: Cocaine  ? Sexual activity: Yes  ?Other Topics Concern  ? Not on file  ?Social History Narrative  ? ** Merged History Encounter **  ?    ? ?Social Determinants of Health  ? ?Financial Resource Strain: Not on file  ?Food Insecurity: Not on file  ?Transportation Needs: Not on file  ?Physical Activity: Not on file  ?Stress: Not on file  ?Social Connections: Not on file  ?Intimate Partner Violence: Not on file  ?  ?Family History:   ?Family History  ?Problem Relation Age of Onset  ? Hypertension Father   ? CAD Father   ?     s/p CABG  ? Heart disease Mother   ? Heart disease Maternal Grandfather   ?  ? ?ROS:  ?Please see the history of present illness.  ?All other ROS reviewed and negative.    ? ?Physical Exam/Data:  ? ?Vitals:  ? 09/07/21 1330 09/07/21 1400 09/07/21 1422 09/07/21 1427  ?BP: 135/82 122/85  121/85  ?Pulse: 89 78  (!) 109  ?Resp: 20 17  18   ?Temp:    97.7 ?F (36.5 ?C)  ?TempSrc:    Oral  ?SpO2: 97% 96%  97%  ?Weight:   72.4 kg   ?Height:   5\' 10"  (1.778 m)   ? ?No intake or output data in the 24 hours ending 09/07/21 1618 ? ?  09/07/2021  ?  2:22 PM 03/05/2021  ? 11:30 AM 02/25/2021  ?  7:38 AM  ?Last 3 Weights  ?Weight (lbs) 159 lb 11.2 oz 170 lb 165 lb  ?Weight (kg) 72.439 kg 77.111 kg 74.844 kg  ?   ?Body mass index is 22.91 kg/m?.   ?General:  Well nourished, well developed, in no acute distress ?HEENT: normal ?Neck: no JVD ?Vascular: No carotid bruits; Distal pulses 2+ bilaterally ?Cardiac:  normal S1, S2; RRR; no murmur  ?Lungs:  clear to auscultation bilaterally, no wheezing, rhonchi or rales  ?Abd: soft, nontender, no hepatomegaly  ?Ext: no  edema ?Musculoskeletal:  No deformities, BUE and BLE strength normal and equal ?Skin: warm and dry  ?Neuro:  CNs 2-12 intact, no focal abnormalities noted ?Psych:  Normal affect  ? ?EKG: 3/26 15:19 personally reviewed and demonstrates: Sinus with no ST elevation ?ECG 3/26 08: 56 sinus with no ST elevation ?ECG 3/25 19: 13 sinus with no ST elevation ?Telemetry:  Telemetry was personally reviewed and demonstrates:  Sinus rhythm  ? ?Relevant CV Studies: ? ?Echo 11/2020 ?Marland Kitchen Left ventricular ejection fraction, by estimation, is 50 to 55%. The  ?left ventricle has low normal function. The left ventricle has no regional  ?wall motion abnormalities. There is mild left ventricular hypertrophy of  ?the basal-septal segment. Left  ?ventricular diastolic parameters are consistent with Grade I diastolic  ?dysfunction (impaired relaxation). The average left ventricular global  ?longitudinal strain is -13.1 %. The global longitudinal strain is  ?abnormal.  ? 2. Right ventricular systolic function is normal. The right ventricular  ?size is normal.  ? 3. The mitral valve is normal in structure. No evidence of mitral valve  ?regurgitation. No evidence of mitral stenosis.  ? 4. The aortic valve is tricuspid. Aortic valve regurgitation is not  ?visualized. No aortic stenosis is present.  ? 5. The inferior vena cava is normal in size with greater than 50%  ?respiratory variability, suggesting right atrial pressure of 3 mmHg.  ? ? ?Cath 12/2017 ?LEFT HEART CATH AND CORONARY ANGIOGRAPHY  ? ?Conclusion ? ?Widely patent coronary arteries.  Coronary anatomy. ?Left main is normal. ?The LAD contains 30 to 40% mid eccentric narrowing  after the second diagonal.  The LAD does not wraparound the apex.  The distal segment demonstrates some evidence of systolic compression. ?The circumflex is widely patent.  The second and third obtuse marginals contained 40% os

## 2021-09-07 NOTE — ED Notes (Signed)
MD notified via messenger of critical lab test. ?

## 2021-09-08 ENCOUNTER — Encounter (HOSPITAL_COMMUNITY)
Admission: EM | Disposition: A | Discharge status: Home / Self Care | Payer: Self-pay | Source: Home / Self Care | Attending: Internal Medicine

## 2021-09-08 ENCOUNTER — Encounter (HOSPITAL_COMMUNITY): Payer: Self-pay | Admitting: Cardiovascular Disease

## 2021-09-08 DIAGNOSIS — I2510 Atherosclerotic heart disease of native coronary artery without angina pectoris: Secondary | ICD-10-CM

## 2021-09-08 DIAGNOSIS — I214 Non-ST elevation (NSTEMI) myocardial infarction: Secondary | ICD-10-CM

## 2021-09-08 HISTORY — PX: LEFT HEART CATH AND CORONARY ANGIOGRAPHY: CATH118249

## 2021-09-08 LAB — CBC
HCT: 46.2 % (ref 39.0–52.0)
Hemoglobin: 14.8 g/dL (ref 13.0–17.0)
MCH: 30.8 pg (ref 26.0–34.0)
MCHC: 32 g/dL (ref 30.0–36.0)
MCV: 96 fL (ref 80.0–100.0)
Platelets: 289 10*3/uL (ref 150–400)
RBC: 4.81 MIL/uL (ref 4.22–5.81)
RDW: 13.6 % (ref 11.5–15.5)
WBC: 8.1 10*3/uL (ref 4.0–10.5)
nRBC: 0 % (ref 0.0–0.2)

## 2021-09-08 LAB — GLUCOSE, CAPILLARY: Glucose-Capillary: 111 mg/dL — ABNORMAL HIGH (ref 70–99)

## 2021-09-08 LAB — HEPARIN LEVEL (UNFRACTIONATED): Heparin Unfractionated: 0.26 IU/mL — ABNORMAL LOW (ref 0.30–0.70)

## 2021-09-08 SURGERY — LEFT HEART CATH AND CORONARY ANGIOGRAPHY
Anesthesia: LOCAL

## 2021-09-08 MED ORDER — HEPARIN (PORCINE) IN NACL 1000-0.9 UT/500ML-% IV SOLN
INTRAVENOUS | Status: AC
  Filled 2021-09-08: qty 1000

## 2021-09-08 MED ORDER — LIDOCAINE HCL (PF) 1 % IJ SOLN
INTRAMUSCULAR | Status: DC | PRN
  Administered 2021-09-08: 2 mL

## 2021-09-08 MED ORDER — SODIUM CHLORIDE 0.9 % WEIGHT BASED INFUSION
3.0000 mL/kg/h | INTRAVENOUS | Status: DC
  Administered 2021-09-08: 3 mL/kg/h via INTRAVENOUS

## 2021-09-08 MED ORDER — HEPARIN SODIUM (PORCINE) 1000 UNIT/ML IJ SOLN
INTRAMUSCULAR | Status: DC | PRN
  Administered 2021-09-08: 4000 [IU] via INTRAVENOUS

## 2021-09-08 MED ORDER — SODIUM CHLORIDE 0.9 % IV SOLN: 250.0000 mL | INTRAVENOUS | Status: DC | PRN

## 2021-09-08 MED ORDER — VERAPAMIL HCL 2.5 MG/ML IV SOLN
INTRAVENOUS | Status: DC | PRN
  Administered 2021-09-08: 10 mL via INTRA_ARTERIAL

## 2021-09-08 MED ORDER — HEPARIN SODIUM (PORCINE) 1000 UNIT/ML IJ SOLN
INTRAMUSCULAR | Status: AC
  Filled 2021-09-08: qty 10

## 2021-09-08 MED ORDER — ASPIRIN 81 MG PO CHEW: 81.0000 mg | CHEWABLE_TABLET | ORAL | Status: DC

## 2021-09-08 MED ORDER — SODIUM CHLORIDE 0.9% FLUSH: 3.0000 mL | INTRAVENOUS | Status: DC | PRN

## 2021-09-08 MED ORDER — ONDANSETRON HCL 4 MG/2ML IJ SOLN: 4.0000 mg | Freq: Four times a day (QID) | INTRAMUSCULAR | Status: DC | PRN

## 2021-09-08 MED ORDER — FENTANYL CITRATE (PF) 100 MCG/2ML IJ SOLN
INTRAMUSCULAR | Status: DC | PRN
  Administered 2021-09-08: 25 ug via INTRAVENOUS

## 2021-09-08 MED ORDER — MIDAZOLAM HCL 2 MG/2ML IJ SOLN
INTRAMUSCULAR | Status: DC | PRN
  Administered 2021-09-08: 1 mg via INTRAVENOUS

## 2021-09-08 MED ORDER — SODIUM CHLORIDE 0.9 % WEIGHT BASED INFUSION: 1.0000 mL/kg/h | INTRAVENOUS | Status: DC

## 2021-09-08 MED ORDER — FENTANYL CITRATE (PF) 100 MCG/2ML IJ SOLN
INTRAMUSCULAR | Status: AC
  Filled 2021-09-08: qty 2

## 2021-09-08 MED ORDER — LIDOCAINE HCL (PF) 1 % IJ SOLN
INTRAMUSCULAR | Status: AC
  Filled 2021-09-08: qty 30

## 2021-09-08 MED ORDER — VERAPAMIL HCL 2.5 MG/ML IV SOLN
INTRAVENOUS | Status: AC
  Filled 2021-09-08: qty 2

## 2021-09-08 MED ORDER — AMLODIPINE BESYLATE 10 MG PO TABS
10.0000 mg | ORAL_TABLET | Freq: Every day | ORAL | Status: DC
  Administered 2021-09-08: 10 mg via ORAL
  Filled 2021-09-08: qty 1

## 2021-09-08 MED ORDER — HEPARIN (PORCINE) IN NACL 1000-0.9 UT/500ML-% IV SOLN
INTRAVENOUS | Status: DC | PRN
  Administered 2021-09-08 (×2): 500 mL

## 2021-09-08 MED ORDER — LOSARTAN POTASSIUM 50 MG PO TABS: 50.0000 mg | ORAL_TABLET | Freq: Every day | ORAL | 2 refills | Status: AC

## 2021-09-08 MED ORDER — IOHEXOL 350 MG/ML SOLN
INTRAVENOUS | Status: DC | PRN
  Administered 2021-09-08: 30 mL

## 2021-09-08 MED ORDER — SODIUM CHLORIDE 0.9% FLUSH: 3.0000 mL | Freq: Two times a day (BID) | INTRAVENOUS | Status: DC

## 2021-09-08 MED ORDER — MIDAZOLAM HCL 2 MG/2ML IJ SOLN
INTRAMUSCULAR | Status: AC
  Filled 2021-09-08: qty 2

## 2021-09-08 MED ORDER — LABETALOL HCL 5 MG/ML IV SOLN: 10.0000 mg | INTRAVENOUS | Status: AC | PRN

## 2021-09-08 SURGICAL SUPPLY — 12 items
BAND CMPR LRG ZPHR (HEMOSTASIS) ×1
BAND ZEPHYR COMPRESS 30 LONG (HEMOSTASIS) ×1 IMPLANT
CATH DIAG 6FR JR4 (CATHETERS) ×1 IMPLANT
CATH INFINITI 6F FL3.5 (CATHETERS) ×1 IMPLANT
GLIDESHEATH SLEND SS 6F .021 (SHEATH) ×1 IMPLANT
GUIDEWIRE INQWIRE 1.5J.035X260 (WIRE) IMPLANT
INQWIRE 1.5J .035X260CM (WIRE) ×2
KIT HEART LEFT (KITS) ×3 IMPLANT
PACK CARDIAC CATHETERIZATION (CUSTOM PROCEDURE TRAY) ×3 IMPLANT
TRANSDUCER W/STOPCOCK (MISCELLANEOUS) ×3 IMPLANT
TUBING CIL FLEX 10 FLL-RA (TUBING) ×3 IMPLANT
WIRE MICROINTRODUCER 60CM (WIRE) ×1 IMPLANT

## 2021-09-08 NOTE — Progress Notes (Addendum)
? ? ?The patient has been seen in conjunction with Harlan Stains, NP. All aspects of care have been considered and discussed. The patient has been personally interviewed, examined, and all clinical data has been reviewed. ? ?Likely non-ST elevation MI related to impact of cocaine on epicardial or microcirculation.  Given multiple risk factors and length of time since last coronary angiogram, I believe coronary angiography is needed to exclude ruptured plaque.  Last angiography was performed in 2019. ?Today's cath does not reveal any significant change compared to prior. ?Plan to discharge later today. ?Urged to avoid opiates. ? ?Progress Note ? ?Patient Name: Bryce Ortega ?Date of Encounter: 09/08/2021 ? ?[facility] Cardiologist: Sinclair Grooms, MD  ? ?Subjective  ? ?No chest pain this morning. Planned for cardiac cath.  ? ?Inpatient Medications  ?  ?Scheduled Meds: ? amLODipine  5 mg Oral Daily  ? aspirin EC  81 mg Oral Daily  ? atorvastatin  40 mg Oral Daily  ? isosorbide mononitrate  60 mg Oral Daily  ? losartan  25 mg Oral Daily  ? pantoprazole (PROTONIX) IV  40 mg Intravenous Q24H  ? ?Continuous Infusions: ? heparin 1,250 Units/hr (09/08/21 0157)  ? ?PRN Meds: ?acetaminophen **OR** acetaminophen, hydrALAZINE, LORazepam, nitroGLYCERIN  ? ?Vital Signs  ?  ?Vitals:  ? 09/07/21 2012 09/08/21 [id number] 09/08/21 0419 09/08/21 0834  ?BP: 126/82 (!) 136/96 126/75 (!) 157/103  ?Pulse: 82 73 61 65  ?Resp: 16 18 16 16   ?Temp: 98.5 ?F (36.9 ?C) 98.4 ?F (36.9 ?C) 97.8 ?F (36.6 ?C) 97.9 ?F (36.6 ?C)  ?TempSrc: Oral Oral Oral Oral  ?SpO2: 96% 96% 94% 97%  ?Weight:      ?Height:      ? ? ?Intake/Output Summary (Last 24 hours) at 09/08/2021 0859 ?Last data filed at 09/08/2021 0700 ?Gross per 24 hour  ?Intake 687.86 ml  ?Output 1000 ml  ?Net -312.14 ml  ? ? ?  09/07/2021  ?  2:22 PM 03/05/2021  ? 11:30 AM 02/25/2021  ?  7:38 AM  ?Last 3 Weights  ?Weight (lbs) 159 lb 11.2 oz 170 lb 165 lb  ?Weight (kg) 72.439 kg 77.111 kg  74.844 kg  ?   ? ?Telemetry  ?  ?SR - Personally Reviewed ? ?ECG  ?  ?No new tracing this morning ? ?Physical Exam  ? ?GEN: No acute distress.   ?Neck: No JVD ?Cardiac: RRR, no murmurs, rubs, or gallops.  ?Respiratory: Clear to auscultation bilaterally. ?GI: Soft, nontender, non-distended  ?MS: No edema; No deformity. ?Neuro:  Nonfocal  ?Psych: Normal affect  ? ?Labs  ?  ?High Sensitivity Troponin:   ?Recent Labs  ?Lab 09/06/21 ?2139 09/07/21 ?0604 09/07/21 ?[id number] 09/07/21 ?1158 09/07/21 ?1410  ?TROPONINIHS 7 7 165* 754* 1,755*  ?   ?Chemistry ?Recent Labs  ?Lab 09/06/21 ?1952 09/07/21 ?0604  ?NA 138 136  ?K 4.6 4.9  ?CL 108 109  ?CO2 23 24  ?GLUCOSE 95 101*  ?BUN 14 15  ?CREATININE 0.95 1.00  ?CALCIUM 8.2* 8.5*  ?MG  --  2.3  ?PROT  --  5.6*  ?ALBUMIN  --  3.2*  ?AST  --  31  ?ALT  --  17  ?ALKPHOS  --  52  ?BILITOT  --  1.3*  ?GFRNONAA >60 >60  ?ANIONGAP 7 3*  ?  ?Lipids No results for input(s): CHOL, TRIG, HDL, LABVLDL, LDLCALC, CHOLHDL in the last 168 hours.  ?Hematology ?Recent Labs  ?Lab 09/06/21 ?1952 09/07/21 ?0604 09/08/21 ?  0113  ?WBC 7.8 7.8 8.1  ?RBC 4.96 5.00 4.81  ?HGB 15.7 15.7 14.8  ?HCT 47.3 48.1 46.2  ?MCV 95.4 96.2 96.0  ?MCH 31.7 31.4 30.8  ?MCHC 33.2 32.6 32.0  ?RDW 13.5 13.6 13.6  ?PLT 291 277 289  ? ?Thyroid No results for input(s): TSH, FREET4 in the last 168 hours.  ?BNP ?Recent Labs  ?Lab 09/07/21 ?0604  ?BNP 35.2  ?  ?DDimer No results for input(s): DDIMER in the last 168 hours.  ? ?Radiology  ?  ?DG Chest 2 View ? ?Result Date: 09/06/2021 ?CLINICAL DATA:  Chest pain. EXAM: CHEST - 2 VIEW COMPARISON:  Chest radiograph dated 03/05/2021. FINDINGS: The heart size and mediastinal contours are within normal limits. Both lungs are clear. The visualized skeletal structures are unremarkable. IMPRESSION: No active cardiopulmonary disease. Electronically Signed   By: Anner Crete M.D.   On: 09/06/2021 19:40  ? ?ECHOCARDIOGRAM COMPLETE ? ?Result Date: 09/07/2021 ?   ECHOCARDIOGRAM REPORT   Patient  Name:   Bryce Ortega Date of Exam: 09/07/2021 Medical Rec #:  [id number]        Height:       70.0 in Accession #:    [id number]       Weight:       170.0 lb Date of Birth:  [birth year]        BSA:          1.948 m? Patient Age:    61 years         BP:           135/82 mmHg Patient Gender: M                HR:           90 bpm. Exam Location:  Inpatient Procedure: 2D Echo, 3D Echo, Color Doppler, Cardiac Doppler and Strain Analysis Indications:    Chest Pain R07.9  History:        Patient has prior history of Echocardiogram examinations, most                 recent 12/06/2020. CHF, CAD and Previous Myocardial Infarction;                 Risk Factors:Hypertension and Dyslipidemia.  Sonographer:    Darlina Sicilian RDCS Referring Phys: [id number] [place]  1. Left ventricular ejection fraction by 3D volume is 62 %. The left ventricle has normal function. The left ventricle has no regional wall motion abnormalities. There is mild asymmetric left ventricular hypertrophy of the septal segment. Left ventricular diastolic parameters are consistent with Grade I diastolic dysfunction (impaired relaxation).  2. Right ventricular systolic function is normal. The right ventricular size is normal. Tricuspid regurgitation signal is inadequate for assessing PA pressure.  3. The mitral valve is normal in structure. Trivial mitral valve regurgitation. No evidence of mitral stenosis.  4. The aortic valve is grossly normal. Aortic valve regurgitation is not visualized. No aortic stenosis is present.  5. There is borderline dilatation of the ascending aorta, measuring 37 mm.  6. The inferior vena cava is normal in size with greater than 50% respiratory variability, suggesting right atrial pressure of 3 mmHg. FINDINGS  Left Ventricle: Left ventricular ejection fraction by 3D volume is 62 %. The left ventricle has normal function. The left ventricle has no regional wall motion abnormalities. Global longitudinal strain  performed but not reported based on interpreter judgement due to suboptimal tracking. The left  ventricular internal cavity size was normal in size. There is mild asymmetric left ventricular hypertrophy of the septal segment. Left ventricular diastolic parameters are consistent with Grade I diastolic dysfunction (impaired relaxation). Right Ventricle: The right ventricular size is normal. No increase in right ventricular wall thickness. Right ventricular systolic function is normal. Tricuspid regurgitation signal is inadequate for assessing PA pressure. Left Atrium: Left atrial size was normal in size. Right Atrium: Right atrial size was normal in size. Pericardium: There is no evidence of pericardial effusion. Mitral Valve: The mitral valve is normal in structure. Trivial mitral valve regurgitation. No evidence of mitral valve stenosis. Tricuspid Valve: The tricuspid valve is normal in structure. Tricuspid valve regurgitation is trivial. No evidence of tricuspid stenosis. Aortic Valve: The aortic valve is grossly normal. Aortic valve regurgitation is not visualized. No aortic stenosis is present. Pulmonic Valve: The pulmonic valve was normal in structure. Pulmonic valve regurgitation is not visualized. No evidence of pulmonic stenosis. Aorta: The aortic root is normal in size and structure. There is borderline dilatation of the ascending aorta, measuring 37 mm. Venous: The inferior vena cava is normal in size with greater than 50% respiratory variability, suggesting right atrial pressure of 3 mmHg. IAS/Shunts: The interatrial septum appears to be lipomatous. No atrial level shunt detected by color flow Doppler.  LEFT VENTRICLE PLAX 2D LVIDd:         3.90 cm         Diastology LVIDs:         2.80 cm         LV e' medial:    7.36 cm/s LV PW:         0.70 cm         LV E/e' medial:  6.9 LV IVS:        1.10 cm         LV e' lateral:   9.57 cm/s LVOT diam:     2.20 cm         LV E/e' lateral: 5.3 LV SV:         63 LV SV  Index:   33 LVOT Area:     3.80 cm?        3D Volume EF                                LV 3D EF:    Left                                             ventricul                                             ar

## 2021-09-08 NOTE — Discharge Summary (Signed)
?Physician Discharge Summary ?  ?Patient: Bryce Ortega MRN: [id number] DOB: 08-06-1960  ?Admit date:     09/06/2021  ?Discharge date: 09/08/21  ?Discharge Physician: Rebekah Chesterfield Mahari Strahm  ? ?PCP: Lavinia Sharps, NP  ? ?Recommendations at discharge:  ? ?Follow-up with your primary care physician in 1 week. ?Follow-up with cardiology as scheduled by the clinic ? ?Discharge Diagnoses: ?Principal Problem: ?  NSTEMI (non-ST elevated myocardial infarction) (HCC) ?Active Problems: ?  Hypertensive urgency ?  Cocaine abuse (HCC) ?  Chronic diastolic CHF (congestive heart failure) (HCC) ?  Hyperlipidemia ? ?Resolved Problems: ?  Chest pain ? ?Hospital Course: ?Patient is a 61 years old male with history of nonobstructive CAD, cocaine abuse, hypertension hyperlipidemia chronic diastolic heart failure presented to hospital with intermittent substernal chest pain nonexertional in nature.  Patient took nitroglycerin with relief of pain.  Patient was then admitted hospital for further evaluation and treatment.  ?  ?Assessment and Plan: ? ?Chest pain likely non-ST elevation MI. ?-Likely secondary to cocaine abuse.  Presented with substernal chest pressure.  Troponins initially were negative.  Followed by subsequent rapid uptrend.  EKG without ischemic ST or T wave changes.  Patient had previous catheterization in 12/2017 with widely patent coronaries.  2D echocardiogram 09/07/2021 showed LV ejection fraction of 62% with mild asymmetric left ventricular hypertrophy.  Cardiology was consulted and patient underwent cardiac catheterization on 09/08/2021.  Catheterization did not reveal any significant change compared to prior.  Continue aspirin Lipitor Imdur losartan.  Patient will follow-up with his primary care physician and cardiology as outpatient ?  ?Hypertensive urgency ?On presentation.  Could be secondary to cocaine abuse as well.  Improved after nitroglycerin and resumption of home medication. Continue aspirin Imdur losartan from  home. ? ?Chronic diastolic CHF (congestive heart failure) (HCC) ?Recent echo in 11/2020 had shown EF 50 to 55%, G1 DD.  Compensated at this time.2D echocardiogram 09/07/2021 showed LV ejection fraction of 62% with mild asymmetric left ventricular hypertrophy. ?  ?Cocaine abuse (HCC) ?- UDS positive for benzodiazepines, cocaine.  Counseled about it. ?  ?Hyperlipidemia ?-Continue statin ? ?Consultants: Cardiology ?Procedures performed: Cardiac catheterization on 09/08/2021 ? ?Disposition: Home ? ?Diet recommendation:  ?Discharge Diet Orders (From admission, onward)  ? ?  Start     Ordered  ? 09/08/21 0000  Diet - low sodium heart healthy       ? 09/08/21 1712  ? ?  ?  ? ?  ? ?Cardiac diet ? ?DISCHARGE MEDICATION: ?Allergies as of 09/08/2021   ? ?   Reactions  ? Propoxyphene N-acetaminophen Nausea Only, Other (See Comments)  ? GI upset  ? ?  ? ?  ?Medication List  ?  ? ?STOP taking these medications   ? ?azithromycin 250 MG tablet ?Commonly known as: ZITHROMAX ?  ? ?  ? ?TAKE these medications   ? ?amLODipine 5 MG tablet ?Commonly known as: NORVASC ?Take 1 tablet (5 mg total) by mouth daily. ?  ?aspirin EC 81 MG tablet ?Take 1 tablet (81 mg total) by mouth daily. ?  ?atorvastatin 40 MG tablet ?Commonly known as: LIPITOR ?Take 1 tablet (40 mg total) by mouth daily. ?  ?isosorbide mononitrate 30 MG 24 hr tablet ?Commonly known as: IMDUR ?Take 1 tablet (30 mg total) by mouth daily. ?  ?losartan 50 MG tablet ?Commonly known as: COZAAR ?Take 1 tablet (50 mg total) by mouth daily. ?What changed: Another medication with the same name was removed. Continue taking this medication, and follow the  directions you see here. ?  ?nitroGLYCERIN 0.4 MG SL tablet ?Commonly known as: NITROSTAT ?PLACE 1 TABLET (0.4 MG TOTAL) UNDER THE TONGUE EVERY FIVE MINUTES AS NEEDED FOR CHEST PAIN (FOR 3 DOSES). ?What changed:  ?how much to take ?how to take this ?when to take this ?reasons to take this ?  ? ?  ? ? Follow-up Information   ? ? Placey, [name], NP Follow up in 1 week(s).   ?Contact information: ?[address] ?[place] [address] ?[phone] ? ? ?  ?  ? ? Lyn Records, MD .   ?Specialty: Cardiology ?Contact information: ?[address] ?[address] ?[place] [address] ?[phone] ? ? ?  ?  ? ?  ?  ? ?  ? ?Subjective: ?Today, patient was seen and examined at bedside.  Patient denies any shortness of breath or chest pain.  Seen prior to cardiac catheterization. ? ?Discharge Exam: ?Ceasar Mons Weights  ? 09/07/21 1422  ?Weight: 72.4 kg  ? ? ?  09/08/2021  ?  3:14 PM 09/08/2021  ? 12:15 PM 09/08/2021  ?  9:23 AM  ?Vitals with BMI  ?Systolic 129 131 600  ?Diastolic 88 89 104  ?Pulse 79 77   ?  ?General: Thinly built, not in obvious distress ?HENT:   No scleral pallor or icterus noted. Oral mucosa is moist.  ?Chest:  Clear breath sounds.  Diminished breath sounds bilaterally. No crackles or wheezes.  ?CVS: S1 &S2 heard. No murmur.  Regular rate and rhythm. ?Abdomen: Soft, nontender, nondistended.  Bowel sounds are heard.   ?Extremities: No cyanosis, clubbing or edema.  Peripheral pulses are palpable. ?Psych: Alert, awake and oriented, normal mood ?CNS:  No cranial nerve deficits.  Power equal in all extremities.   ?Skin: Warm and dry.  No rashes noted. ? ?Condition at discharge: good ? ?The results of significant diagnostics from this hospitalization (including imaging, microbiology, ancillary and laboratory) are listed below for reference.  ? ?Imaging Studies: ?DG Chest 2 View ? ?Result Date: 09/06/2021 ?CLINICAL DATA:  Chest pain. EXAM: CHEST - 2 VIEW COMPARISON:  Chest radiograph dated 03/05/2021. FINDINGS: The heart size and mediastinal contours are within normal limits. Both lungs are clear. The visualized skeletal structures are unremarkable. IMPRESSION: No active cardiopulmonary disease. Electronically Signed   By: Elgie Collard M.D.   On: 09/06/2021 19:40  ? ?CARDIAC CATHETERIZATION ? ?Result Date: 09/08/2021 ?1.  Mild nonobstructive  coronary artery disease as outlined with mild 40% stenosis of the large dominant RCA, mild nonobstructive plaquing in the LAD and left circumflex branch vessels 2.  Normal LVEDP with normal LV systolic function based on noninvasive assessment Recommendations: Medical therapy.  No obstructive CAD noted.  Further management per inpatient care teams.  ? ?ECHOCARDIOGRAM COMPLETE ? ?Result Date: 09/07/2021 ?   ECHOCARDIOGRAM REPORT   Patient Name:   Bryce Ortega Date of Exam: 09/07/2021 Medical Rec #:  [id number]        Height:       70.0 in Accession #:    [id number]       Weight:       170.0 lb Date of Birth:  October 09, 1960        BSA:          1.948 m? Patient Age:    60 years         BP:           135/82 mmHg Patient Gender: M  HR:           90 bpm. Exam Location:  Inpatient Procedure: 2D Echo, 3D Echo, Color Doppler, Cardiac Doppler and Strain Analysis Indications:    Chest Pain R07.9  History:        Patient has prior history of Echocardiogram examinations, most                 recent 12/06/2020. CHF, CAD and Previous Myocardial Infarction;                 Risk Factors:Hypertension and Dyslipidemia.  Sonographer:    Leta Jungling RDCS Referring Phys: [id number] Angie Fava IMPRESSIONS  1. Left ventricular ejection fraction by 3D volume is 62 %. The left ventricle has normal function. The left ventricle has no regional wall motion abnormalities. There is mild asymmetric left ventricular hypertrophy of the septal segment. Left ventricular diastolic parameters are consistent with Grade I diastolic dysfunction (impaired relaxation).  2. Right ventricular systolic function is normal. The right ventricular size is normal. Tricuspid regurgitation signal is inadequate for assessing PA pressure.  3. The mitral valve is normal in structure. Trivial mitral valve regurgitation. No evidence of mitral stenosis.  4. The aortic valve is grossly normal. Aortic valve regurgitation is not visualized. No aortic stenosis  is present.  5. There is borderline dilatation of the ascending aorta, measuring 37 mm.  6. The inferior vena cava is normal in size with greater than 50% respiratory variability, suggesting right atrial p

## 2021-09-08 NOTE — Interval H&P Note (Signed)
History and Physical Interval Note: ? ?09/08/2021 ?11:28 AM ? ?Bryce Ortega  has presented today for surgery, with the diagnosis of nstemi.  The various methods of treatment have been discussed with the patient and family. After consideration of risks, benefits and other options for treatment, the patient has consented to  Procedure(s): ?LEFT HEART CATH AND CORONARY ANGIOGRAPHY (N/A) as a surgical intervention.  The patient's history has been reviewed, patient examined, no change in status, stable for surgery.  I have reviewed the patient's chart and labs.  Questions were answered to the patient's satisfaction.   ? ? ?[name] ? ? ?

## 2021-09-08 NOTE — Progress Notes (Signed)
ANTICOAGULATION CONSULT NOTE ?Pharmacy Consult for Heparin ?Indication: chest pain/ACS ?Brief A/P: Heparin level subtherapeutic Increase Heparin rate ? ?Allergies  ?Allergen Reactions  ? Propoxyphene N-Acetaminophen Nausea Only and Other (See Comments)  ?  GI upset  ? ? ?Patient Measurements: ?Height: 5\' 10"  (177.8 cm) ?Weight: 72.4 kg (159 lb 11.2 oz) ?IBW/kg (Calculated) : 73 ? ?Vital Signs: ?Temp: 98.4 ?F (36.9 ?C) (03/27 0027) ?Temp Source: Oral (03/27 0027) ?BP: 136/96 (03/27 0027) ?Pulse Rate: 73 (03/27 0027) ? ?Labs: ?Recent Labs  ?  09/06/21 ?1952 09/06/21 ?2139 09/07/21 ?0604 09/07/21 ?09/09/21 09/07/21 ?1158 09/07/21 ?1410 09/08/21 ?0113  ?HGB 15.7  --  15.7  --   --   --  14.8  ?HCT 47.3  --  48.1  --   --   --  46.2  ?PLT 291  --  277  --   --   --  289  ?HEPARINUNFRC  --   --   --   --   --   --  0.26*  ?CREATININE 0.95  --  1.00  --   --   --   --   ?TROPONINIHS 7   < > 7 165* 754* 1,755*  --   ? < > = values in this interval not displayed.  ? ? ? ?Estimated Creatinine Clearance: 80.4 mL/min (by C-G formula based on SCr of 1 mg/dL). ? ?Assessment: ?61 year old male to begin heparin for chest pain ? ?Goal of Therapy:  ?Heparin level 0.3-0.7 units/ml ?Monitor platelets by anticoagulation protocol: Yes ?  ?Plan:  ?Increase Heparin 1250 units/hr ?Follow up after cath today  ? ?67, PharmD, BCPS  ?09/08/2021,1:47 AM ? ? ?

## 2021-09-08 NOTE — Plan of Care (Signed)

## 2021-09-08 NOTE — Progress Notes (Signed)
?PROGRESS NOTE ? ? ? ?Bryce Ortega  [id number] DOB: 05-09-1961 DOA: 09/06/2021 ?PCP: Marliss Coots, NP  ? ? ?Brief Narrative:  ?Patient is a 61 years old male with history of nonobstructive CAD, cocaine abuse, hypertension hyperlipidemia chronic diastolic heart failure presented to hospital with intermittent substernal chest pain nonexertional in nature.  Patient took nitroglycerin with relief of pain.  Patient was then admitted hospital for further evaluation and treatment.  ?  ?Assessment and Plan: ? ?Chest pain likely non-ST elevation MI. ?-Presented with substernal chest pressure.  Troponins initially were negative.  Followed by subsequent rapid uptrend.  EKG without infection ST or T wave changes.  Patient had previous catheterization in 12/2017 with widely patent coronaries.  2D echocardiogram 09/07/2021 showed LV ejection fraction of 62% with mild asymmetric left ventricular hypertrophy.  Cardiology has seen the patient and waiting for cardiac catheterization.  Continue aspirin Lipitor Imdur losartan ? ?Hypertensive urgency ?On presentation.  Improved after nitroglycerin and resumption of home medication. Continue aspirin Imdur losartan as needed hydralazine and labetalol. ? ?Chronic diastolic CHF (congestive heart failure) ([place]) ?Recent echo in 11/2020 had shown EF 50 to 55%, G1 DD.  Compensated at this time.2D echocardiogram 09/07/2021 showed LV ejection fraction of 62% with mild asymmetric left ventricular hypertrophy ? ?Cocaine abuse ([place]) ?- UDS positive for benzodiazepines, cocaine.  Counseled about it. ? ?Hyperlipidemia ?-Continue statin ?  ? DVT prophylaxis: SCDs Start: 09/06/21 2336 ? ? ?Code Status:   ?  Code Status: Full Code ? ?Disposition: Home ?Status is: Inpatient ?Remains inpatient appropriate because: Need for cardiac catheterization ? ? Family Communication:  ?Spoke with the patient at bedside ? ?Consultants:  ?Cardiology ? ?Procedures:  ?2D echocardiogram ? ?Antimicrobials:   ?None ? ?Anti-infectives (From admission, onward)  ? ? None  ? ?  ? ?Subjective: ?Today, patient was seen and examined at bedside.  Patient denies any shortness of breath or chest pain.  Seen prior to cardiac catheterization ? ?Objective: ?Vitals:  ? 09/08/21 0834 09/08/21 [id number] 09/08/21 1135 09/08/21 1215  ?BP: (!) 157/103 (!) 168/104  131/89  ?Pulse: 65   77  ?Resp: 16     ?Temp: 97.9 ?F (36.6 ?C)     ?TempSrc: Oral     ?SpO2: 97%  97% 93%  ?Weight:      ?Height:      ? ? ?Intake/Output Summary (Last 24 hours) at 09/08/2021 1314 ?Last data filed at 09/08/2021 1107 ?Gross per 24 hour  ?Intake 687.86 ml  ?Output 1450 ml  ?Net -762.14 ml  ? ?Filed Weights  ? 09/07/21 1422  ?Weight: 72.4 kg  ? ? ?Physical Examination: ? ?General: Thinly built, not in obvious distress ?HENT:   No scleral pallor or icterus noted. Oral mucosa is moist.  ?Chest:  Clear breath sounds.  Diminished breath sounds bilaterally. No crackles or wheezes.  ?CVS: S1 &S2 heard. No murmur.  Regular rate and rhythm. ?Abdomen: Soft, nontender, nondistended.  Bowel sounds are heard.   ?Extremities: No cyanosis, clubbing or edema.  Peripheral pulses are palpable. ?Psych: Alert, awake and oriented, normal mood ?CNS:  No cranial nerve deficits.  Power equal in all extremities.   ?Skin: Warm and dry.  No rashes noted. ? ?Data Reviewed:  ? ?CBC: ?Recent Labs  ?Lab 09/06/21 ?1952 09/07/21 ?0604 09/08/21 ?0113  ?WBC 7.8 7.8 8.1  ?NEUTROABS  --  4.1  --   ?HGB 15.7 15.7 14.8  ?HCT 47.3 48.1 46.2  ?MCV 95.4 96.2 96.0  ?PLT 291 277  289  ? ? ?Basic Metabolic Panel: ?Recent Labs  ?Lab 09/06/21 ?1952 09/07/21 ?0604  ?NA 138 136  ?K 4.6 4.9  ?CL 108 109  ?CO2 23 24  ?GLUCOSE 95 101*  ?BUN 14 15  ?CREATININE 0.95 1.00  ?CALCIUM 8.2* 8.5*  ?MG  --  2.3  ? ? ?Liver Function Tests: ?Recent Labs  ?Lab 09/07/21 ?0604  ?AST 31  ?ALT 17  ?ALKPHOS 52  ?BILITOT 1.3*  ?PROT 5.6*  ?ALBUMIN 3.2*  ? ? ? ?Radiology Studies: ?DG Chest 2 View ? ?Result Date: 09/06/2021 ?CLINICAL DATA:   Chest pain. EXAM: CHEST - 2 VIEW COMPARISON:  Chest radiograph dated 03/05/2021. FINDINGS: The heart size and mediastinal contours are within normal limits. Both lungs are clear. The visualized skeletal structures are unremarkable. IMPRESSION: No active cardiopulmonary disease. Electronically Signed   By: Anner Crete M.D.   On: 09/06/2021 19:40  ? ?CARDIAC CATHETERIZATION ? ?Result Date: 09/08/2021 ?1.  Mild nonobstructive coronary artery disease as outlined with mild 40% stenosis of the large dominant RCA, mild nonobstructive plaquing in the LAD and left circumflex branch vessels 2.  Normal LVEDP with normal LV systolic function based on noninvasive assessment Recommendations: Medical therapy.  No obstructive CAD noted.  Further management per inpatient care teams.  ? ?ECHOCARDIOGRAM COMPLETE ? ?Result Date: 09/07/2021 ?   ECHOCARDIOGRAM REPORT   Patient Name:   Bryce Ortega Date of Exam: 09/07/2021 Medical Rec #:  [id number]        Height:       70.0 in Accession #:    [id number]       Weight:       170.0 lb Date of Birth:  1961/02/06        BSA:          1.948 m? Patient Age:    58 years         BP:           135/82 mmHg Patient Gender: M                HR:           90 bpm. Exam Location:  Inpatient Procedure: 2D Echo, 3D Echo, Color Doppler, Cardiac Doppler and Strain Analysis Indications:    Chest Pain R07.9  History:        Patient has prior history of Echocardiogram examinations, most                 recent 12/06/2020. CHF, CAD and Previous Myocardial Infarction;                 Risk Factors:Hypertension and Dyslipidemia.  Sonographer:    Darlina Sicilian RDCS Referring Phys: [id number] [place]  1. Left ventricular ejection fraction by 3D volume is 62 %. The left ventricle has normal function. The left ventricle has no regional wall motion abnormalities. There is mild asymmetric left ventricular hypertrophy of the septal segment. Left ventricular diastolic parameters are consistent with  Grade I diastolic dysfunction (impaired relaxation).  2. Right ventricular systolic function is normal. The right ventricular size is normal. Tricuspid regurgitation signal is inadequate for assessing PA pressure.  3. The mitral valve is normal in structure. Trivial mitral valve regurgitation. No evidence of mitral stenosis.  4. The aortic valve is grossly normal. Aortic valve regurgitation is not visualized. No aortic stenosis is present.  5. There is borderline dilatation of the ascending aorta, measuring 37 mm.  6. The inferior vena cava  is normal in size with greater than 50% respiratory variability, suggesting right atrial pressure of 3 mmHg. FINDINGS  Left Ventricle: Left ventricular ejection fraction by 3D volume is 62 %. The left ventricle has normal function. The left ventricle has no regional wall motion abnormalities. Global longitudinal strain performed but not reported based on interpreter judgement due to suboptimal tracking. The left ventricular internal cavity size was normal in size. There is mild asymmetric left ventricular hypertrophy of the septal segment. Left ventricular diastolic parameters are consistent with Grade I diastolic dysfunction (impaired relaxation). Right Ventricle: The right ventricular size is normal. No increase in right ventricular wall thickness. Right ventricular systolic function is normal. Tricuspid regurgitation signal is inadequate for assessing PA pressure. Left Atrium: Left atrial size was normal in size. Right Atrium: Right atrial size was normal in size. Pericardium: There is no evidence of pericardial effusion. Mitral Valve: The mitral valve is normal in structure. Trivial mitral valve regurgitation. No evidence of mitral valve stenosis. Tricuspid Valve: The tricuspid valve is normal in structure. Tricuspid valve regurgitation is trivial. No evidence of tricuspid stenosis. Aortic Valve: The aortic valve is grossly normal. Aortic valve regurgitation is not  visualized. No aortic stenosis is present. Pulmonic Valve: The pulmonic valve was normal in structure. Pulmonic valve regurgitation is not visualized. No evidence of pulmonic stenosis. Aorta: The aortic root is normal in si

## 2021-09-08 NOTE — Progress Notes (Signed)
Mobility Specialist Progress Note ? ? 09/08/21 1757  ?Mobility  ?Activity Refused mobility ?(Pt preparring for d/c home)  ? ?Pt putting clothes on getting ready to leave. ?  ?Frederico Hamman ?Mobility Specialist ?Phone Number [phone] ? ?

## 2021-09-11 ENCOUNTER — Encounter (HOSPITAL_COMMUNITY): Payer: Self-pay

## 2021-09-11 ENCOUNTER — Emergency Department (HOSPITAL_COMMUNITY): Payer: Self-pay

## 2021-09-11 ENCOUNTER — Emergency Department (HOSPITAL_COMMUNITY)
Admission: EM | Admit: 2021-09-11 | Discharge: 2021-09-11 | Disposition: A | Discharge status: Home / Self Care | Payer: Self-pay | Attending: Emergency Medicine | Admitting: Emergency Medicine

## 2021-09-11 ENCOUNTER — Other Ambulatory Visit: Payer: Self-pay

## 2021-09-11 DIAGNOSIS — I110 Hypertensive heart disease with heart failure: Secondary | ICD-10-CM | POA: Insufficient documentation

## 2021-09-11 DIAGNOSIS — R079 Chest pain, unspecified: Secondary | ICD-10-CM | POA: Insufficient documentation

## 2021-09-11 DIAGNOSIS — Z7982 Long term (current) use of aspirin: Secondary | ICD-10-CM | POA: Insufficient documentation

## 2021-09-11 DIAGNOSIS — I509 Heart failure, unspecified: Secondary | ICD-10-CM | POA: Insufficient documentation

## 2021-09-11 DIAGNOSIS — Z79899 Other long term (current) drug therapy: Secondary | ICD-10-CM | POA: Insufficient documentation

## 2021-09-11 DIAGNOSIS — I2510 Atherosclerotic heart disease of native coronary artery without angina pectoris: Secondary | ICD-10-CM | POA: Insufficient documentation

## 2021-09-11 LAB — CBC WITH DIFFERENTIAL/PLATELET
Abs Immature Granulocytes: 0.03 10*3/uL (ref 0.00–0.07)
Basophils Absolute: 0.1 10*3/uL (ref 0.0–0.1)
Basophils Relative: 1 %
Eosinophils Absolute: 0.4 10*3/uL (ref 0.0–0.5)
Eosinophils Relative: 7 %
HCT: 45.2 % (ref 39.0–52.0)
Hemoglobin: 15.3 g/dL (ref 13.0–17.0)
Immature Granulocytes: 0 %
Lymphocytes Relative: 30 %
Lymphs Abs: 2 10*3/uL (ref 0.7–4.0)
MCH: 32 pg (ref 26.0–34.0)
MCHC: 33.8 g/dL (ref 30.0–36.0)
MCV: 94.6 fL (ref 80.0–100.0)
Monocytes Absolute: 0.9 10*3/uL (ref 0.1–1.0)
Monocytes Relative: 14 %
Neutro Abs: 3.3 10*3/uL (ref 1.7–7.7)
Neutrophils Relative %: 48 %
Platelets: 282 10*3/uL (ref 150–400)
RBC: 4.78 MIL/uL (ref 4.22–5.81)
RDW: 13.4 % (ref 11.5–15.5)
WBC: 6.8 10*3/uL (ref 4.0–10.5)
nRBC: 0 % (ref 0.0–0.2)

## 2021-09-11 LAB — COMPREHENSIVE METABOLIC PANEL
ALT: 22 U/L (ref 0–44)
AST: 24 U/L (ref 15–41)
Albumin: 3.5 g/dL (ref 3.5–5.0)
Alkaline Phosphatase: 60 U/L (ref 38–126)
Anion gap: 7 (ref 5–15)
BUN: 15 mg/dL (ref 6–20)
CO2: 23 mmol/L (ref 22–32)
Calcium: 8.9 mg/dL (ref 8.9–10.3)
Chloride: 108 mmol/L (ref 98–111)
Creatinine, Ser: 0.96 mg/dL (ref 0.61–1.24)
GFR, Estimated: >60 mL/min (ref >60)
Glucose, Bld: 106 mg/dL — ABNORMAL HIGH (ref 70–99)
Potassium: 4.2 mmol/L (ref 3.5–5.1)
Sodium: 138 mmol/L (ref 135–145)
Total Bilirubin: 0.7 mg/dL (ref 0.3–1.2)
Total Protein: 6.5 g/dL (ref 6.5–8.1)

## 2021-09-11 LAB — TROPONIN I (HIGH SENSITIVITY)
Troponin I (High Sensitivity): 610 ng/L (ref <18)
Troponin I (High Sensitivity): 394 ng/L (ref <18)

## 2021-09-11 LAB — RAPID URINE DRUG SCREEN, HOSP PERFORMED
Amphetamines: NOT DETECTED
Barbiturates: NOT DETECTED
Benzodiazepines: NOT DETECTED
Cocaine: POSITIVE — AB
Opiates: NOT DETECTED
Tetrahydrocannabinol: NOT DETECTED

## 2021-09-11 LAB — LIPASE, BLOOD: Lipase: 33 U/L (ref 11–51)

## 2021-09-11 MED ORDER — ISOSORBIDE MONONITRATE ER 60 MG PO TB24: 30.0000 mg | ORAL_TABLET | Freq: Every day | ORAL | 1 refills | Status: AC

## 2021-09-11 MED ORDER — SODIUM CHLORIDE 0.9 % IV BOLUS
1000.0000 mL | Freq: Once | INTRAVENOUS | Status: AC
  Administered 2021-09-11: 1000 mL via INTRAVENOUS

## 2021-09-11 NOTE — ED Triage Notes (Signed)
Pt c/o CP that radiates to left side that woke him up from sleep. EMS gave 1 Nitro SL and ASA 324 mg.Pt rates pain 4/10. ?

## 2021-09-11 NOTE — ED Provider Notes (Signed)
Clinical Course as of 09/11/21 1028  ?Thu Sep 11, 2021  ?0645 Patient taken in sign out from PA Petrucelli. Here wit elevated troponins and CP. Recent admission with Trop of 1700 after cocaine use. Patient adamantly denies cocaine use since last visit. Recent cath showed nonobstuctive CAD.Dr. Duke Salvia consulted- will repeat troponin and and obtain UDS. Reevaluate. Has received ASA [AH]  ?0932 COCAINE(!): POSITIVE [AH]  ?0932 Troponin I (High Sensitivity)(!!) [AH]  ?0932 Troponin I (High Sensitivity)(!!): 394 ?Repeat troponin improving, UDS is positive for cocaine again. [AH]  ?1027 Case discussed with Dr. Duke Salvia. Increase Imdur to 60. Reccomends dc cocaine use. Safe for discharge. [AH]  ?  ?Clinical Course User Index ?[AH] Arthor Captain, PA-C  ? ? ? ?Patient here with history of cocaine abuse.  His recent catheterization showed nonocclusive coronary artery disease and his elevated troponin is likely secondary to vasospasm.  Patient still having mild active chest pain however his troponins are trending downward.  I discussed the case with Dr. Duke Salvia as noted in the clinical course and will adjust his medications per her recommendations.  At this time the patient appears appropriate for discharge.  He is advised strongly against continuing to use any cocaine.  He may follow-up with PCP in the outpatient setting. ?  ?Arthor Captain, PA-C ?09/11/21 1032 ? ?  ?Franne Forts, DO ?09/12/21 [id number] ? ?

## 2021-09-11 NOTE — Discharge Instructions (Addendum)
Cocaine is a very potent medication that causes the arteries on your heart to spasm.  This leads to lack of blood flow to your heart causing you to have chest pain.  You will not have chest pain if you stop using cocaine and you are highly advised to discontinue using it.  Your urine was positive for cocaine today.  Your recent coronary artery study showed mild disease and no blockages in your arteries.  No further interventions will be done.  Please follow-up with your primary care doctor. ? ?`  ?SEEK IMMEDIATE MEDICAL ATTENTION IF: ?You have severe chest pain, especially if the pain is crushing or pressure-like and spreads to the arms, back, neck, or jaw, or if you have sweating, nausea (feeling sick to your stomach), or shortness of breath. THIS IS AN EMERGENCY. Don't wait to see if the pain will go away. Get medical help at once. Call 911 or 0 (operator). DO NOT drive yourself to the hospital.  ?Your chest pain gets worse and does not go away with rest.  ?You have an attack of chest pain lasting longer than usual, despite rest and treatment with the medications your caregiver has prescribed.  ?You wake from sleep with chest pain or shortness of breath.  ?You feel dizzy or faint.  ?You have chest pain not typical of your usual pain for which you originally saw your caregiver. ? ?

## 2021-09-11 NOTE — ED Provider Notes (Signed)
?[facility] EMERGENCY DEPARTMENT ?Provider Note ? ? ?CSN: [id number] ?Arrival date & time: 09/11/21  3664 ? ?  ? ?History ? ?Chief Complaint  ?Patient presents with  ? Chest Pain  ? ? ?Bryce Ortega is a 61 y.o. male with a history of CHF, CAD, hyperlipidemia, hypertension, and polysubstance use who presents to the emergency department via EMS with complaints of chest pain that began 1 hour prior to arrival.  Patient states he woke from sleep with left-sided sharp chest pain, no alleviating or aggravating factors, currently a 4 out of 10 in severity.  States this feels similar to when he needed to be admitted to the hospital very recently.  He denies cocaine utilization since discharge or other drug use since discharge.  He denies nausea, vomiting, diaphoresis, dyspnea, or syncope.  Given aspirin and nitroglycerin by EMS without much change. ? ?HPI ? ?  ? ?Home Medications ?Prior to Admission medications   ?Medication Sig Start Date End Date Taking? Authorizing Provider  ?amLODipine (NORVASC) 5 MG tablet Take 1 tablet (5 mg total) by mouth daily. 11/14/20   Derwood Kaplan, MD  ?aspirin EC 81 MG tablet Take 1 tablet (81 mg total) by mouth daily. 03/20/18   Hongalgi, Maximino Greenland, MD  ?atorvastatin (LIPITOR) 40 MG tablet Take 1 tablet (40 mg total) by mouth daily. 08/26/20   Sabino Dick, DO  ?isosorbide mononitrate (IMDUR) 30 MG 24 hr tablet Take 1 tablet (30 mg total) by mouth daily. 11/14/20   Derwood Kaplan, MD  ?losartan (COZAAR) 50 MG tablet Take 1 tablet (50 mg total) by mouth daily. 09/08/21 10/08/21  Pokhrel, Rebekah Chesterfield, MD  ?nitroGLYCERIN (NITROSTAT) 0.4 MG SL tablet PLACE 1 TABLET (0.4 MG TOTAL) UNDER THE TONGUE EVERY FIVE MINUTES AS NEEDED FOR CHEST PAIN (FOR 3 DOSES). ?Patient taking differently: Place 0.4 mg under the tongue every 5 (five) minutes x 3 doses as needed for chest pain. 08/26/20 09/06/21  Sabino Dick, DO  ?   ? ?Allergies    ?Propoxyphene n-acetaminophen   ? ?Review of  Systems   ?Review of Systems  ?Constitutional:  Negative for chills and fever.  ?Respiratory:  Negative for shortness of breath.   ?Cardiovascular:  Positive for chest pain.  ?Gastrointestinal:  Negative for abdominal pain, nausea and vomiting.  ?Neurological:  Negative for syncope.  ?All other systems reviewed and are negative. ? ?Physical Exam ?Updated Vital Signs ?BP (!) 143/99 (BP Location: Right Arm)   Pulse 65   Temp 97.8 ?F (36.6 ?C) (Oral)   Resp 16   Ht 5\' 10"  (1.778 m)   Wt 72.4 kg   SpO2 99%   BMI 22.90 kg/m?  ?Physical Exam ?Vitals and nursing note reviewed.  ?Constitutional:   ?   General: He is not in acute distress. ?   Appearance: He is well-developed. He is not toxic-appearing.  ?HENT:  ?   Head: Normocephalic and atraumatic.  ?Eyes:  ?   General:     ?   Right eye: No discharge.     ?   Left eye: No discharge.  ?   Conjunctiva/sclera: Conjunctivae normal.  ?Cardiovascular:  ?   Rate and Rhythm: Normal rate and regular rhythm.  ?Pulmonary:  ?   Effort: No respiratory distress.  ?   Breath sounds: Normal breath sounds. No wheezing or rales.  ?Abdominal:  ?   General: There is no distension.  ?   Palpations: Abdomen is soft.  ?   Tenderness: There is  no abdominal tenderness. There is no guarding or rebound.  ?Musculoskeletal:  ?   Cervical back: Neck supple.  ?   Comments: No significant lower extremity pitting edema.  Calves are nontender.  ?Skin: ?   General: Skin is warm and dry.  ?Neurological:  ?   Comments: Clear speech.   ?Psychiatric:     ?   Behavior: Behavior normal.  ? ? ?ED Results / Procedures / Treatments   ?Labs ?(all labs ordered are listed, but only abnormal results are displayed) ?Labs Reviewed  ?COMPREHENSIVE METABOLIC PANEL - Abnormal; Notable for the following components:  ?    Result Value  ? Glucose, Bld 106 (*)   ? All other components within normal limits  ?TROPONIN I (HIGH SENSITIVITY) - Abnormal; Notable for the following components:  ? Troponin I (High Sensitivity)  610 (*)   ? All other components within normal limits  ?LIPASE, BLOOD  ?CBC WITH DIFFERENTIAL/PLATELET  ?TROPONIN I (HIGH SENSITIVITY)  ? ? ?EKG ?EKG Interpretation ? ?Date/Time:  Thursday September 11 2021 03:56:24 EDT ?Ventricular Rate:  69 ?PR Interval:  167 ?QRS Duration: 75 ?QT Interval:  405 ?QTC Calculation: 434 ?R Axis:   32 ?Text Interpretation: Sinus rhythm Confirmed by Zadie Rhine ([id number]) on 09/11/2021 4:02:49 AM ? ?Radiology ?DG Chest 2 View ? ?Result Date: 09/11/2021 ?CLINICAL DATA:  61 year old male with chest pain. EXAM: CHEST - 2 VIEW COMPARISON:  Chest radiographs 09/06/2021 and earlier. FINDINGS: Seated upright AP and lateral views of the chest. Lower lung volumes. Mediastinal contours remain normal. Visualized tracheal air column is within normal limits. No pneumothorax, pulmonary edema, pleural effusion or confluent pulmonary opacity. Mild crowding of lung markings. No acute osseous abnormality identified. Negative visible bowel gas. IMPRESSION: Lower lung volumes.  No acute cardiopulmonary abnormality. Electronically Signed   By: Odessa Fleming M.D.   On: 09/11/2021 05:34   ? ?Procedures ?Procedures  ? ? ?Medications Ordered in ED ?Medications - No data to display ? ?ED Course/ Medical Decision Making/ A&P ?  ?                        ?Medical Decision Making ?Amount and/or Complexity of Data Reviewed ?Labs: ordered. ?Radiology: ordered. ? ? ?Patient presents to the ED with complaints of chest pain, this involves an extensive number of treatment options, and is a complaint that carries with it a high risk of complications and morbidity. Nontoxic, vitals w/ hypertension.  ? ?Ddx including but not limited to: ACS, PE, dissection, pneumothorax, pneumonia, anxiety, musculoskeletal pain, intra-abdominal process, arrhythmia, critical electrolyte derangement/anemia, substance abuse demand ischemia ? ?Additional history obtained:  ?Chart & nursing note reviewed.  ?Left heart cath 09/08/21:  ?1.  Mild  nonobstructive coronary artery disease as outlined with mild 40% stenosis of the large dominant RCA, mild nonobstructive plaquing in the LAD and left circumflex branch vessels ?2.  Normal LVEDP with normal LV systolic function based on noninvasive assessment ?  ?Recommendations: Medical therapy.  No obstructive CAD noted.  Further management per inpatient care teams. ?  ?Echocardiogram 09/07/21:  ?EF 62% ?Mild LVH ?Grade I diastolic dysfunction.  ? ?Recent hospital admission 03/25 through 03/27, patient presented to the emergency department with chest pain and was found to have NSTEMI, initially had negative troponins with and had rapid uptrend, catheterization did not reveal any significant change compared to prior, NSTEMI thought to be secondary to cocaine use, recommendation was to continue aspirin, Lipitor, Imdur, and losartan. ? ?EKG: Sinus  rhythm.  ? ?Lab Tests:  ?I reviewed & interpreted labs including:  ?CBC: Unremarkable ?CMP: Unremarkable ?Lipase: Within normal limits ?Troponin: 610 ? ?Imaging Studies ordered:  ?I ordered and viewed the following imaging, agree with radiologist impression:  ?Chest x-ray:Lower lung volumes.  No acute cardiopulmonary abnormality. ? ?Patient with troponin elevation at 610, his EKG does not show acute ischemia, on chart review with his last admission his initial troponin was 165--> 754-->1755. He denies cocaine/amphetamine use sine last admission. Will check UDS. Received aspirin with EMS.  ? ?06:44: CONSULT: Discussed with cardiologist Dr. Duke Salvia, recommends delta troponin and UDS, call back with results, no heparin at this time. Appreciate input.  ? ?06:45: Patient care signed out to PA Harris @ shift change pending remaining work up and disposition.  ? ?Discussed w/ attending Dr. Bebe Shaggy - in agreement.  ? ?Portions of this note were generated with Scientist, clinical (histocompatibility and immunogenetics). Dictation errors may occur despite best attempts at proofreading. ? ? ?Final Clinical  Impression(s) / ED Diagnoses ?Final diagnoses:  ?Chest pain, unspecified type  ? ? ?Rx / DC Orders ?ED Discharge Orders   ? ? None  ? ?  ? ? ?  ?Cherly Anderson, [place] ?09/11/21 7471 ? ?  ?Zadie Rhine, MD ?09/12/21 [phone] ? ?

## 2021-09-11 NOTE — ED Notes (Signed)
Urinal provided, pt aware that specimen is needed ?

## 2021-09-24 ENCOUNTER — Encounter (HOSPITAL_BASED_OUTPATIENT_CLINIC_OR_DEPARTMENT_OTHER): Payer: Self-pay

## 2021-09-24 ENCOUNTER — Other Ambulatory Visit: Payer: Self-pay

## 2021-09-24 ENCOUNTER — Emergency Department (HOSPITAL_BASED_OUTPATIENT_CLINIC_OR_DEPARTMENT_OTHER)
Admission: EM | Admit: 2021-09-24 | Discharge: 2021-09-24 | Disposition: A | Discharge status: Home / Self Care | Payer: Self-pay | Attending: Emergency Medicine | Admitting: Emergency Medicine

## 2021-09-24 DIAGNOSIS — Z5321 Procedure and treatment not carried out due to patient leaving prior to being seen by health care provider: Secondary | ICD-10-CM | POA: Insufficient documentation

## 2021-09-24 DIAGNOSIS — L237 Allergic contact dermatitis due to plants, except food: Secondary | ICD-10-CM | POA: Insufficient documentation

## 2021-09-24 NOTE — ED Triage Notes (Signed)
Pt c/o rash from poison ivy to torso and arms x 2 days. Been using calamine lotion without relief. ?

## 2021-10-14 ENCOUNTER — Emergency Department (HOSPITAL_BASED_OUTPATIENT_CLINIC_OR_DEPARTMENT_OTHER)
Admission: EM | Admit: 2021-10-14 | Discharge: 2021-10-15 | Disposition: A | Discharge status: Home / Self Care | Payer: Self-pay | Attending: Emergency Medicine | Admitting: Emergency Medicine

## 2021-10-14 ENCOUNTER — Emergency Department (HOSPITAL_BASED_OUTPATIENT_CLINIC_OR_DEPARTMENT_OTHER): Payer: Self-pay

## 2021-10-14 ENCOUNTER — Other Ambulatory Visit: Payer: Self-pay

## 2021-10-14 DIAGNOSIS — Z79899 Other long term (current) drug therapy: Secondary | ICD-10-CM | POA: Insufficient documentation

## 2021-10-14 DIAGNOSIS — Z7982 Long term (current) use of aspirin: Secondary | ICD-10-CM | POA: Insufficient documentation

## 2021-10-14 DIAGNOSIS — T405X1A Poisoning by cocaine, accidental (unintentional), initial encounter: Secondary | ICD-10-CM | POA: Insufficient documentation

## 2021-10-14 DIAGNOSIS — T50901A Poisoning by unspecified drugs, medicaments and biological substances, accidental (unintentional), initial encounter: Secondary | ICD-10-CM

## 2021-10-14 DIAGNOSIS — I10 Essential (primary) hypertension: Secondary | ICD-10-CM | POA: Insufficient documentation

## 2021-10-14 DIAGNOSIS — F1910 Other psychoactive substance abuse, uncomplicated: Secondary | ICD-10-CM | POA: Insufficient documentation

## 2021-10-14 DIAGNOSIS — I4729 Other ventricular tachycardia: Secondary | ICD-10-CM

## 2021-10-14 LAB — CBC
HCT: 51.9 % (ref 39.0–52.0)
Hemoglobin: 16.9 g/dL (ref 13.0–17.0)
MCH: 30.6 pg (ref 26.0–34.0)
MCHC: 32.6 g/dL (ref 30.0–36.0)
MCV: 93.9 fL (ref 80.0–100.0)
Platelets: 318 10*3/uL (ref 150–400)
RBC: 5.53 MIL/uL (ref 4.22–5.81)
RDW: 13.7 % (ref 11.5–15.5)
WBC: 8 10*3/uL (ref 4.0–10.5)
nRBC: 0 % (ref 0.0–0.2)

## 2021-10-14 LAB — COMPREHENSIVE METABOLIC PANEL
ALT: 75 U/L — ABNORMAL HIGH (ref 0–44)
AST: 133 U/L — ABNORMAL HIGH (ref 15–41)
Albumin: 4.1 g/dL (ref 3.5–5.0)
Alkaline Phosphatase: 76 U/L (ref 38–126)
Anion gap: 12 (ref 5–15)
BUN: 11 mg/dL (ref 6–20)
CO2: 22 mmol/L (ref 22–32)
Calcium: 8.9 mg/dL (ref 8.9–10.3)
Chloride: 105 mmol/L (ref 98–111)
Creatinine, Ser: 1.31 mg/dL — ABNORMAL HIGH (ref 0.61–1.24)
GFR, Estimated: >60 mL/min (ref >60)
Glucose, Bld: 145 mg/dL — ABNORMAL HIGH (ref 70–99)
Potassium: 3.5 mmol/L (ref 3.5–5.1)
Sodium: 139 mmol/L (ref 135–145)
Total Bilirubin: 0.7 mg/dL (ref 0.3–1.2)
Total Protein: 7.7 g/dL (ref 6.5–8.1)

## 2021-10-14 LAB — TROPONIN I (HIGH SENSITIVITY)
Troponin I (High Sensitivity): 91 ng/L — ABNORMAL HIGH (ref <18)
Troponin I (High Sensitivity): 149 ng/L (ref <18)
Troponin I (High Sensitivity): 157 ng/L (ref <18)

## 2021-10-14 LAB — CBG MONITORING, ED: Glucose-Capillary: 132 mg/dL — ABNORMAL HIGH (ref 70–99)

## 2021-10-14 LAB — ETHANOL: Alcohol, Ethyl (B): 52 mg/dL — ABNORMAL HIGH (ref <10)

## 2021-10-14 MED ORDER — NALOXONE HCL 2 MG/2ML IJ SOSY
1.0000 mg | PREFILLED_SYRINGE | Freq: Once | INTRAMUSCULAR | Status: AC
  Administered 2021-10-14: 1 mg via INTRAVENOUS
  Filled 2021-10-14: qty 2

## 2021-10-14 NOTE — ED Notes (Signed)
Pt is awake and alert  Pt states he does not have a way to get home  Pt resting quietly  ?

## 2021-10-14 NOTE — ED Triage Notes (Signed)
Per ems pt smoked crack at home and then quickly passed out. Pt has had 3 doses of narcan. Appears sweaty and unresponsive upon triage. Vitals stable. ?

## 2021-10-14 NOTE — ED Notes (Signed)
ED Provider at bedside. 

## 2021-10-14 NOTE — ED Provider Notes (Signed)
?[facility] EMERGENCY DEPARTMENT ?Provider Note ? ? ?CSN: [id number] ?Arrival date & time: 10/14/21  1633 ? ?  ? ?History ? ?Chief Complaint  ?Patient presents with  ? Drug Overdose  ? ? ?Bryce Ortega is a 61 y.o. male. ? ? ?Drug Overdose ?Patient presents after becoming unresponsive.  Smoked crack and then passed out.  Improved after Narcan by EMS.  Had 2 mg intranasally and later 1 mg IM.  Reportedly woke up which is GCS of 15.  However now minimally responsive again.  Will arouse somewhat to stimulation. ? ?  ?Past Medical History:  ?Diagnosis Date  ? Cocaine abuse (HCC)   ? Complication of anesthesia   ? H/O ventricular fibrillation 2016  ? s/p cocaine use  ? High cholesterol   ? Hypertension   ? MI (mitral incompetence)   ? Myocardial infarction [facility]) 2016; ?06/2017  ? "both related to cocaine" (03/18/2018)  ? Nonobstructive atherosclerosis of coronary artery 2016  ? LHC showed normal EF and mild non-obstructive CAD  ? ?Past Surgical History:  ?Procedure Laterality Date  ? CARDIAC CATHETERIZATION N/A 05/10/2015  ? Procedure: Left Heart Cath and Coronary Angiography;  Surgeon: Dolores Patty, MD;  Location: [facility] INVASIVE CV LAB;  Service: Cardiovascular;  Laterality: N/A;  ? LEFT HEART CATH AND CORONARY ANGIOGRAPHY N/A 12/27/2017  ? Procedure: LEFT HEART CATH AND CORONARY ANGIOGRAPHY;  Surgeon: Lyn Records, MD;  Location: [facility] INVASIVE CV LAB;  Service: Cardiovascular;  Laterality: N/A;  ? LEFT HEART CATH AND CORONARY ANGIOGRAPHY N/A 09/08/2021  ? Procedure: LEFT HEART CATH AND CORONARY ANGIOGRAPHY;  Surgeon: [name], MD;  Location: [facility] INVASIVE CV LAB;  Service: Cardiovascular;  Laterality: N/A;  ? TONSILLECTOMY    ? ? ? ?Home Medications ?Prior to Admission medications   ?Medication Sig Start Date End Date Taking? Authorizing Provider  ?amLODipine (NORVASC) 5 MG tablet Take 1 tablet (5 mg total) by mouth daily. 11/14/20   Derwood Kaplan, MD  ?aspirin EC 81 MG tablet Take 1 tablet (81 mg total)  by mouth daily. 03/20/18   Hongalgi, Maximino Greenland, MD  ?atorvastatin (LIPITOR) 40 MG tablet Take 1 tablet (40 mg total) by mouth daily. 08/26/20   Sabino Dick, DO  ?isosorbide mononitrate (IMDUR) 60 MG 24 hr tablet Take 0.5 tablets (30 mg total) by mouth daily. 09/11/21   Arthor Captain, PA-C  ?losartan (COZAAR) 50 MG tablet Take 1 tablet (50 mg total) by mouth daily. 09/08/21 10/08/21  Pokhrel, Rebekah Chesterfield, MD  ?nitroGLYCERIN (NITROSTAT) 0.4 MG SL tablet PLACE 1 TABLET (0.4 MG TOTAL) UNDER THE TONGUE EVERY FIVE MINUTES AS NEEDED FOR CHEST PAIN (FOR 3 DOSES). ?Patient taking differently: Place 0.4 mg under the tongue every 5 (five) minutes x 3 doses as needed for chest pain. 08/26/20 09/11/21  Sabino Dick, DO  ?   ? ?Allergies    ?Propoxyphene n-acetaminophen   ? ?Review of Systems   ?Review of Systems ? ?Physical Exam ?Updated Vital Signs ?BP (!) 157/100   Pulse 62   Temp 98.4 ?F (36.9 ?C) (Oral)   Resp 15   SpO2 100%  ?Physical Exam ?Vitals and nursing note reviewed.  ?Constitutional:   ?   Appearance: He is diaphoretic.  ?HENT:  ?   Head: Atraumatic.  ?Eyes:  ?   Comments: Pupils constricted  ?Cardiovascular:  ?   Rate and Rhythm: Regular rhythm.  ?Pulmonary:  ?   Breath sounds: Normal breath sounds.  ?Abdominal:  ?   Tenderness: There is  no abdominal tenderness.  ?Musculoskeletal:     ?   General: No tenderness.  ?   Cervical back: Neck supple.  ?Skin: ?   General: Skin is warm.  ?Neurological:  ?   Comments: Breathing spontaneously.  However pupils constricted and decreased responsiveness.  Does arouse somewhat to voice and painful stimulation  ? ? ?ED Results / Procedures / Treatments   ?Labs ?(all labs ordered are listed, but only abnormal results are displayed) ?Labs Reviewed  ?ETHANOL - Abnormal; Notable for the following components:  ?    Result Value  ? Alcohol, Ethyl (B) 52 (*)   ? All other components within normal limits  ?COMPREHENSIVE METABOLIC PANEL - Abnormal; Notable for the following  components:  ? Glucose, Bld 145 (*)   ? Creatinine, Ser 1.31 (*)   ? AST 133 (*)   ? ALT 75 (*)   ? All other components within normal limits  ?CBG MONITORING, ED - Abnormal; Notable for the following components:  ? Glucose-Capillary 132 (*)   ? All other components within normal limits  ?TROPONIN I (HIGH SENSITIVITY) - Abnormal; Notable for the following components:  ? Troponin I (High Sensitivity) 91 (*)   ? All other components within normal limits  ?TROPONIN I (HIGH SENSITIVITY) - Abnormal; Notable for the following components:  ? Troponin I (High Sensitivity) 149 (*)   ? All other components within normal limits  ?TROPONIN I (HIGH SENSITIVITY) - Abnormal; Notable for the following components:  ? Troponin I (High Sensitivity) 157 (*)   ? All other components within normal limits  ?CBC  ?RAPID URINE DRUG SCREEN, HOSP PERFORMED  ? ? ?EKG ?EKG Interpretation ? ?Date/Time:  Tuesday Oct 14 2021 16:53:51 EDT ?Ventricular Rate:  88 ?PR Interval:  173 ?QRS Duration: 91 ?QT Interval:  407 ?QTC Calculation: 493 ?R Axis:   9 ?Text Interpretation: Sinus rhythm Low voltage, precordial leads Abnormal R-wave progression, early transition Borderline prolonged QT interval Confirmed by Benjiman Core [phone]) on 10/14/2021 5:02:16 PM ? ?Radiology ?DG Chest Portable 1 View ? ?Result Date: 10/14/2021 ?CLINICAL DATA:  Syncope EXAM: PORTABLE CHEST 1 VIEW COMPARISON:  09/11/2021 FINDINGS: The heart size and mediastinal contours are within normal limits. Both lungs are clear. The visualized skeletal structures are unremarkable. IMPRESSION: No active disease. Electronically Signed   By: Jasmine Pang M.D.   On: 10/14/2021 17:19   ? ?Procedures ?Procedures  ? ? ?Medications Ordered in ED ?Medications  ?naloxone [facility]) injection 1 mg (1 mg Intravenous Given 10/14/21 1650)  ? ? ?ED Course/ Medical Decision Making/ A&P ?  ?                        ?Medical Decision Making ?Amount and/or Complexity of Data Reviewed ?Labs: ordered. ?Radiology:  ordered. ? ?Risk ?Prescription drug management. ? ?Patient presented with decreased level of consciousness.  Reportedly smoked cocaine and then became unresponsive.  Given Narcan by EMS and woke up.  Upon arrival decreased responsiveness again.  Given more Narcan and mental status increased.  EKG reassuring after being interpreted by me.  Similar to prior.  Chest x-ray also independently interpreted and reassuring.  Alcohol level mildly elevated.  First troponin elevated at about 90.  Second troponin elevated at 140.  Then 150.  Reviewed patient's cath reports.  Has recently had caths that were reassuring.  Only 40% lesion of the greatest.  Discussed with Dr. Chucky May from cardiology who also reviewed the patient.  At this  point we feel the troponin is stable.  Without chest pain once he is more awake doubt large vessel occlusion.  Likely secondary to cocaine.  I think likely had fentanyl in the cocaine and that decreased mental status.  He has metabolized after being in the ER for around 7 hours.  Feeling better.  More awake.  I think stable for discharge home.  Given resources for follow-up.  Do not feel he needs admission to the hospital at this time. ? ?CRITICAL CARE ?Performed by: Benjiman Core ?Total critical care time: 30 minutes ?Critical care time was exclusive of separately billable procedures and treating other patients. ?Critical care was necessary to treat or prevent imminent or life-threatening deterioration. ?Critical care was time spent personally by me on the following activities: development of treatment plan with patient and/or surrogate as well as nursing, discussions with consultants, evaluation of patient's response to treatment, examination of patient, obtaining history from patient or surrogate, ordering and performing treatments and interventions, ordering and review of laboratory studies, ordering and review of radiographic studies, pulse oximetry and re-evaluation of patient's  condition. ? ? ? ? ?Final Clinical Impression(s) / ED Diagnoses ?Final diagnoses:  ?Accidental overdose, initial encounter  ?Polysubstance abuse (HCC)  ? ? ?Rx / DC Orders ?ED Discharge Orders   ? ? None  ? ?  ? ? ?  ?[place]

## 2021-10-14 NOTE — ED Notes (Signed)
Pt appears to be sweaty and diaphoretic. 1mg  of narcan given.  ?

## 2021-10-14 NOTE — Discharge Instructions (Addendum)
The cocaine looks you have irritated your heart a little bit.  If you keep doing cocaine you will die. ?

## 2021-10-15 MED ORDER — CARVEDILOL 3.125 MG PO TABS: 3.1250 mg | ORAL_TABLET | Freq: Two times a day (BID) | ORAL | 0 refills | Status: AC

## 2021-10-15 NOTE — ED Provider Notes (Signed)
Called by nursing for episode of V. tach.  Patient was sleeping and is asymptomatic.  Patient is pending discharge.  On the monitor patient had 22 beat run of V. tach.  When questions about this patient reports no current symptoms.  Discussed with cardiology fellow new findings.  No recommendation for admission at this time.  Would recommend starting Coreg 3.25 mg. ?  ?Tilden Fossa, MD ?10/15/21 [phone] ? ?

## 2021-10-31 ENCOUNTER — Other Ambulatory Visit: Payer: Self-pay

## 2021-10-31 ENCOUNTER — Emergency Department (HOSPITAL_COMMUNITY): Payer: PRIVATE HEALTH INSURANCE

## 2021-10-31 ENCOUNTER — Emergency Department (HOSPITAL_COMMUNITY)
Admission: EM | Admit: 2021-10-31 | Discharge: 2021-10-31 | Disposition: A | Discharge status: Home / Self Care | Payer: PRIVATE HEALTH INSURANCE | Attending: Student | Admitting: Student

## 2021-10-31 ENCOUNTER — Encounter (HOSPITAL_COMMUNITY): Payer: Self-pay | Admitting: *Deleted

## 2021-10-31 DIAGNOSIS — S61401A Unspecified open wound of right hand, initial encounter: Secondary | ICD-10-CM

## 2021-10-31 DIAGNOSIS — I5032 Chronic diastolic (congestive) heart failure: Secondary | ICD-10-CM | POA: Insufficient documentation

## 2021-10-31 DIAGNOSIS — S61411A Laceration without foreign body of right hand, initial encounter: Secondary | ICD-10-CM | POA: Diagnosis not present

## 2021-10-31 DIAGNOSIS — W278XXA Contact with other nonpowered hand tool, initial encounter: Secondary | ICD-10-CM | POA: Diagnosis not present

## 2021-10-31 DIAGNOSIS — Z23 Encounter for immunization: Secondary | ICD-10-CM | POA: Insufficient documentation

## 2021-10-31 DIAGNOSIS — S6991XA Unspecified injury of right wrist, hand and finger(s), initial encounter: Secondary | ICD-10-CM | POA: Diagnosis present

## 2021-10-31 DIAGNOSIS — Z7982 Long term (current) use of aspirin: Secondary | ICD-10-CM | POA: Diagnosis not present

## 2021-10-31 DIAGNOSIS — Z79899 Other long term (current) drug therapy: Secondary | ICD-10-CM | POA: Insufficient documentation

## 2021-10-31 DIAGNOSIS — I110 Hypertensive heart disease with heart failure: Secondary | ICD-10-CM | POA: Insufficient documentation

## 2021-10-31 MED ORDER — TETANUS-DIPHTH-ACELL PERTUSSIS 5-2.5-18.5 LF-MCG/0.5 IM SUSY
0.5000 mL | PREFILLED_SYRINGE | Freq: Once | INTRAMUSCULAR | Status: AC
  Administered 2021-10-31: 0.5 mL via INTRAMUSCULAR
  Filled 2021-10-31: qty 0.5

## 2021-10-31 NOTE — ED Triage Notes (Signed)
[facility]IB [facility] with open wound to rt hand, injury about a week ago, pt accidentally jabbed a screwdriver in his hand.

## 2021-10-31 NOTE — ED Provider Notes (Signed)
[facility]-EMERGENCY DEPT Provider Note  CSN: [id number] Arrival date & time: 10/31/21 1446  Chief Complaint(s) Hand Injury  HPI Bryce Ortega is a 61 y.o. male who presents emergency department for evaluation of a right hand injury.  Patient states that 1 week ago he was using a screwdriver and it slipped cutting the palmar surface of his right hand.  He was recently arrested today and is brought by the present facility for evaluation of this wound.  On arrival, patient arrives with a well granulating nonerythematous nonpainful wound to the palmar surface of the right hand.  Patient denies pain, numbness, tingling, weakness or any systemic symptoms including fever, chest pain, shortness of breath, nausea, vomiting or other systemic symptoms.   Past Medical History Past Medical History:  Diagnosis Date   Cocaine abuse (HCC)    Complication of anesthesia    H/O ventricular fibrillation 2016   s/p cocaine use   High cholesterol    Hypertension    MI (mitral incompetence)    Myocardial infarction (HCC) 2016; ?06/2017   "both related to cocaine" (03/18/2018)   Nonobstructive atherosclerosis of coronary artery 2016   LHC showed normal EF and mild non-obstructive CAD   Patient Active Problem List   Diagnosis Date Noted   Chronic diastolic CHF (congestive heart failure) (HCC) 09/07/2021   Polysubstance abuse (HCC) 12/06/2020   Cocaine use    AKI (acute kidney injury) (HCC) 07/07/2017   Ischemia, myocardial, acute (HCC) 07/06/2017   Elevated troponin    Cocaine adverse reaction 02/23/2017   Precordial chest pain 02/22/2017   Mild CAD 02/22/2017   NSTEMI (non-ST elevated myocardial infarction) (HCC) 05/15/2015   Cocaine abuse (HCC) 05/15/2015   Chest pain at rest    Cardiac arrest (HCC) 05/10/2015   Hyperlipidemia 05/10/2015   Hypertensive urgency 01/10/2009   SHOULDER PAIN, RIGHT 01/10/2009   CERVICALGIA 01/10/2009   LOW BACK PAIN 01/10/2009   Home  Medication(s) Prior to Admission medications   Medication Sig Start Date End Date Taking? Authorizing Provider  amLODipine (NORVASC) 5 MG tablet Take 1 tablet (5 mg total) by mouth daily. 11/14/20   Derwood Kaplan, MD  aspirin EC 81 MG tablet Take 1 tablet (81 mg total) by mouth daily. 03/20/18   Hongalgi, Maximino Greenland, MD  atorvastatin (LIPITOR) 40 MG tablet Take 1 tablet (40 mg total) by mouth daily. 08/26/20   Sabino Dick, DO  carvedilol (COREG) 3.125 MG tablet Take 1 tablet (3.125 mg total) by mouth 2 (two) times daily with a meal. 10/15/21   Tilden Fossa, MD  isosorbide mononitrate (IMDUR) 60 MG 24 hr tablet Take 0.5 tablets (30 mg total) by mouth daily. 09/11/21   Harris, Abigail, PA-C  losartan (COZAAR) 50 MG tablet Take 1 tablet (50 mg total) by mouth daily. 09/08/21 10/08/21  Pokhrel, Rebekah Chesterfield, MD  nitroGLYCERIN (NITROSTAT) 0.4 MG SL tablet PLACE 1 TABLET (0.4 MG TOTAL) UNDER THE TONGUE EVERY FIVE MINUTES AS NEEDED FOR CHEST PAIN (FOR 3 DOSES). Patient taking differently: Place 0.4 mg under the tongue every 5 (five) minutes x 3 doses as needed for chest pain. 08/26/20 09/11/21  Sabino Dick, DO  Past Surgical History Past Surgical History:  Procedure Laterality Date   CARDIAC CATHETERIZATION N/A 05/10/2015   Procedure: Left Heart Cath and Coronary Angiography;  Surgeon: Dolores Patty, MD;  Location: [facility] INVASIVE CV LAB;  Service: Cardiovascular;  Laterality: N/A;   LEFT HEART CATH AND CORONARY ANGIOGRAPHY N/A 12/27/2017   Procedure: LEFT HEART CATH AND CORONARY ANGIOGRAPHY;  Surgeon: Lyn Records, MD;  Location: [facility] INVASIVE CV LAB;  Service: Cardiovascular;  Laterality: N/A;   LEFT HEART CATH AND CORONARY ANGIOGRAPHY N/A 09/08/2021   Procedure: LEFT HEART CATH AND CORONARY ANGIOGRAPHY;  Surgeon: [name], MD;  Location: [facility] INVASIVE CV LAB;   Service: Cardiovascular;  Laterality: N/A;   TONSILLECTOMY     Family History Family History  Problem Relation Age of Onset   Hypertension Father    CAD Father        s/p CABG   Heart disease Mother    Heart disease Maternal Grandfather     Social History Social History   Tobacco Use   Smoking status: Never   Smokeless tobacco: Never  Vaping Use   Vaping Use: Never used  Substance Use Topics   Alcohol use: Not Currently    Alcohol/week: 13.0 standard drinks    Types: 13 Cans of beer per week   Drug use: Yes    Types: Cocaine   Allergies Propoxyphene n-acetaminophen  Review of Systems Review of Systems  Skin:  Positive for wound.   Physical Exam Vital Signs  I have reviewed the triage vital signs BP (!) 150/104 (BP Location: Left Arm)   Pulse 77   Temp 97.8 F (36.6 C) (Oral)   Resp 18   Ht 5\' 10"  (1.778 m)   Wt 69.9 kg   SpO2 98%   BMI 22.10 kg/m   Physical Exam Constitutional:      General: He is not in acute distress.    Appearance: Normal appearance.  HENT:     Head: Normocephalic and atraumatic.     Nose: No congestion or rhinorrhea.  Eyes:     General:        Right eye: No discharge.        Left eye: No discharge.     Extraocular Movements: Extraocular movements intact.     Pupils: Pupils are equal, round, and reactive to light.  Cardiovascular:     Rate and Rhythm: Normal rate and regular rhythm.     Heart sounds: No murmur heard. Pulmonary:     Effort: No respiratory distress.     Breath sounds: No wheezing or rales.  Abdominal:     General: There is no distension.     Tenderness: There is no abdominal tenderness.  Musculoskeletal:        General: Normal range of motion.     Cervical back: Normal range of motion.  Skin:    General: Skin is warm and dry.     Findings: Lesion (4 cm well granulating laceration over the palmar surface of the right hand) present.  Neurological:     General: No focal deficit present.     Mental Status:  He is alert.    ED Results and Treatments Labs (all labs ordered are listed, but only abnormal results are displayed) Labs Reviewed - No data to display  Radiology DG Hand Complete Right  Result Date: 10/31/2021 CLINICAL DATA:  Open wound of right hand EXAM: RIGHT HAND - COMPLETE 3+ VIEW COMPARISON:  None Available. FINDINGS: There is no evidence of fracture or dislocation. There is no evidence of arthropathy or other focal bone abnormality. Cutaneous skin defect with soft tissue swelling along the lateral aspect of the second [facility] joint common no radiopaque foreign body. IMPRESSION: Cutaneous skin defect and soft tissue swelling along the lateral aspect of the second [facility] joint common no radiopaque foreign body or acute osseous abnormality. Electronically Signed   By: Maudry Mayhew M.D.   On: 10/31/2021 15:20    Pertinent labs & imaging results that were available during my care of the patient were reviewed by me and considered in my medical decision making (see MDM for details).  Medications Ordered in ED Medications - No data to display                                                                                                                                   Procedures Procedures  (including critical care time)  Medical Decision Making / ED Course   This patient presents to the ED for concern of right hand wound, this involves an extensive number of treatment options, and is a complaint that carries with it a high risk of complications and morbidity.  The differential diagnosis includes appropriately granulating wound, fracture, cellulitis, wound infection  MDM: Patient seen emergency room for evaluation of a hand wound.  Physical exam with a open well granulating hand wound over the palmar surface of the right hand.  No surrounding erythema or purulent drainage.   X-ray with no fracture.  Wound care applied to the patient's wound here in the emergency department and tetanus updated.  Patient then discharged present facility.  This wound has been open for greater than 24 hours at this time and is not amenable to surgical closure.  Will need to heal by secondary intention.   Additional history obtained: -Additional history obtained from prison guard -External records from outside source obtained and reviewed including: Chart review including previous notes, labs, imaging, consultation notes   Lab Tests: -I ordered, reviewed, and interpreted labs.   The pertinent results include:   Labs Reviewed - No data to display      Imaging Studies ordered: I ordered imaging studies including x-ray hand I independently visualized and interpreted imaging. I agree with the radiologist interpretation   Medicines ordered and prescription drug management: No orders of the defined types were placed in this encounter.   -I have reviewed the patients home medicines and have made adjustments as needed  Critical interventions none     Cardiac Monitoring: The patient was maintained on a cardiac monitor.  I personally viewed and interpreted the cardiac monitored which showed an underlying rhythm of: NSR  Social Determinants of Health:  Factors impacting patients care include: Currently  incarcerated   Reevaluation: After the interventions noted above, I reevaluated the patient and found that they have :stayed the same  Co morbidities that complicate the patient evaluation  Past Medical History:  Diagnosis Date   Cocaine abuse (HCC)    Complication of anesthesia    H/O ventricular fibrillation 2016   s/p cocaine use   High cholesterol    Hypertension    MI (mitral incompetence)    Myocardial infarction (HCC) 2016; ?06/2017   "both related to cocaine" (03/18/2018)   Nonobstructive atherosclerosis of coronary artery 2016   LHC showed normal EF and mild  non-obstructive CAD      Dispostion: I considered admission for this patient, but his hand wound will heal by secondary intention and he does not meet inpatient criteria for admission.     Final Clinical Impression(s) / ED Diagnoses Final diagnoses:  None     @PCDICTATION @    Glendora Score, MD 10/31/21 1729

## 2022-05-06 ENCOUNTER — Emergency Department (HOSPITAL_COMMUNITY)
Admission: EM | Admit: 2022-05-06 | Discharge: 2022-05-06 | Disposition: A | Discharge status: Home / Self Care | Payer: Self-pay | Attending: Emergency Medicine | Admitting: Emergency Medicine

## 2022-05-06 ENCOUNTER — Other Ambulatory Visit: Payer: Self-pay

## 2022-05-06 ENCOUNTER — Emergency Department (HOSPITAL_COMMUNITY): Payer: Self-pay

## 2022-05-06 ENCOUNTER — Encounter (HOSPITAL_COMMUNITY): Payer: Self-pay

## 2022-05-06 DIAGNOSIS — M25442 Effusion, left hand: Secondary | ICD-10-CM

## 2022-05-06 DIAGNOSIS — Z7982 Long term (current) use of aspirin: Secondary | ICD-10-CM | POA: Insufficient documentation

## 2022-05-06 DIAGNOSIS — M7989 Other specified soft tissue disorders: Secondary | ICD-10-CM | POA: Insufficient documentation

## 2022-05-06 MED ORDER — DICLOFENAC SODIUM 75 MG PO TBEC: 75.0000 mg | DELAYED_RELEASE_TABLET | Freq: Two times a day (BID) | ORAL | 0 refills | Status: AC

## 2022-05-06 NOTE — ED Triage Notes (Signed)
C/o left hand pain with swelling and numbness x1 week.  Denies trauma

## 2022-05-06 NOTE — ED Provider Triage Note (Signed)
Emergency Medicine Provider Triage Evaluation Note  Bryce Ortega , a 61 y.o. male  was evaluated in triage.  Pt complains of swelling to left hand for 1 week.  No injury   Review of Systems  Positive: pain Negative: fever  Physical Exam  BP (!) 156/97 (BP Location: Right Arm)   Pulse 86   Temp 98.1 F (36.7 C) (Oral)   Resp 18   SpO2 95%  Gen:   Awake, no distress   Resp:  Normal effort  MSK:   Moves extremities without difficulty  Other:  Swollen left hand,  pain with movement, nv and ns intact   Medical Decision Making  Medically screening exam initiated at 10:33 AM.  Appropriate orders placed.  [name] was informed that the remainder of the evaluation will be completed by another provider, this initial triage assessment does not replace that evaluation, and the importance of remaining in the ED until their evaluation is complete.     Elson Areas, [place] 05/06/22 1035

## 2022-05-06 NOTE — ED Provider Notes (Signed)
[facility]-EMERGENCY DEPT Provider Note   CSN: [id number] Arrival date & time: 05/06/22  3474     History  Chief Complaint  Patient presents with   Hand Pain    Bryce Ortega is a 61 y.o. male.  Pt complains of swelling in his hand.  Pt does not think he injured his hand.   The history is provided by the patient. No language interpreter was used.  Hand Pain This is a new problem. The problem occurs constantly. The problem has been gradually worsening. Nothing aggravates the symptoms. Nothing relieves the symptoms. He has tried nothing for the symptoms. The treatment provided no relief.       Home Medications Prior to Admission medications   Medication Sig Start Date End Date Taking? Authorizing Provider  amLODipine (NORVASC) 5 MG tablet Take 1 tablet (5 mg total) by mouth daily. 11/14/20   Derwood Kaplan, MD  aspirin EC 81 MG tablet Take 1 tablet (81 mg total) by mouth daily. 03/20/18   Hongalgi, Maximino Greenland, MD  atorvastatin (LIPITOR) 40 MG tablet Take 1 tablet (40 mg total) by mouth daily. 08/26/20   Sabino Dick, DO  carvedilol (COREG) 3.125 MG tablet Take 1 tablet (3.125 mg total) by mouth 2 (two) times daily with a meal. 10/15/21   Tilden Fossa, MD  isosorbide mononitrate (IMDUR) 60 MG 24 hr tablet Take 0.5 tablets (30 mg total) by mouth daily. 09/11/21   Harris, Abigail, PA-C  losartan (COZAAR) 50 MG tablet Take 1 tablet (50 mg total) by mouth daily. 09/08/21 10/08/21  Pokhrel, Rebekah Chesterfield, MD  nitroGLYCERIN (NITROSTAT) 0.4 MG SL tablet PLACE 1 TABLET (0.4 MG TOTAL) UNDER THE TONGUE EVERY FIVE MINUTES AS NEEDED FOR CHEST PAIN (FOR 3 DOSES). Patient taking differently: Place 0.4 mg under the tongue every 5 (five) minutes x 3 doses as needed for chest pain. 08/26/20 09/11/21  Sabino Dick, DO      Allergies    Propoxyphene n-acetaminophen    Review of Systems   Review of Systems  All other systems reviewed and are negative.   Physical  Exam Updated Vital Signs BP (!) 156/97 (BP Location: Right Arm)   Pulse 86   Temp 98.1 F (36.7 C) (Oral)   Resp 18   Ht 5\' 10"  (1.778 m)   Wt 69 kg   SpO2 95%   BMI 21.83 kg/m  Physical Exam Vitals and nursing note reviewed.  Constitutional:      Appearance: He is well-developed.  HENT:     Head: Normocephalic.  Pulmonary:     Effort: Pulmonary effort is normal.  Abdominal:     General: There is no distension.  Musculoskeletal:        General: Swelling and tenderness present. Normal range of motion.     Cervical back: Normal range of motion.     Comments: Diffusely swollen left hand,  no erythema.  Nv and ns intact  from    Neurological:     Mental Status: He is alert and oriented to person, place, and time.     ED Results / Procedures / Treatments   Labs (all labs ordered are listed, but only abnormal results are displayed) Labs Reviewed - No data to display  EKG None  Radiology DG Hand Complete Left  Result Date: 05/06/2022 CLINICAL DATA:  Left hand pain with swelling and numbness for 1 week. No known trauma. EXAM: LEFT HAND - COMPLETE 3+ VIEW COMPARISON:  Left wrist and hand radiographs 01/20/2017  FINDINGS: Interval healing of the previously seen oblique fracture of the distal aspect of the distal phalanx of the thumb. Mild second through fifth DIP joint space narrowing and peripheral osteophytosis. Mild thumb carpometacarpal joint space narrowing. Minimal distal scaphoid peripheral degenerative osteophytes. Mild distal radioulnar joint space narrowing and peripheral osteophytosis with ulnar positive variance. Accurate measurement of the ulnar positive variance is limited by the obliquity of projection. No acute fracture or dislocation. IMPRESSION: Interval healing of the previously seen oblique fracture of the distal aspect of the distal phalanx of the thumb. Mild osteoarthritis as above. Electronically Signed   By: Neita Garnet M.D.   On: 05/06/2022 10:49     Procedures Procedures    Medications Ordered in ED Medications - No data to display  ED Course/ Medical Decision Making/ A&P                           Medical Decision Making Pt complains of swelling to his left hand   Amount and/or Complexity of Data Reviewed Radiology: ordered and independent interpretation performed. Decision-making details documented in ED Course.    Details: Xray left hand ordered reviewed and interpreted.  Xray shows healed 1st distal phalanx fracture    Risk Prescription drug management. Risk Details: Pt has old injury,  I suspect swelling from arthritis, possibly gout.  I will give rx for voltaren,  I advised ace wrap.  Pt advised to follow up with Orthopaedist for evatluiton            Final Clinical Impression(s) / ED Diagnoses Final diagnoses:  Swelling of hand joint, left    Rx / DC Orders ED Discharge Orders          Ordered    diclofenac (VOLTAREN) 75 MG EC tablet  2 times daily        05/06/22 1216          An After Visit Summary was printed and given to the patient.     Osie Cheeks 05/06/22 1217    Rondel Baton, MD 05/07/22 [phone]

## 2022-05-06 NOTE — Discharge Instructions (Addendum)
Return if any problems.

## 2023-04-13 ENCOUNTER — Encounter (HOSPITAL_COMMUNITY): Payer: Self-pay

## 2023-04-13 ENCOUNTER — Emergency Department (HOSPITAL_COMMUNITY)
Admission: EM | Admit: 2023-04-13 | Discharge: 2023-04-13 | Disposition: A | Discharge status: Home / Self Care | Payer: Medicaid Other | Attending: Emergency Medicine | Admitting: Emergency Medicine

## 2023-04-13 DIAGNOSIS — Z7982 Long term (current) use of aspirin: Secondary | ICD-10-CM | POA: Diagnosis not present

## 2023-04-13 DIAGNOSIS — R040 Epistaxis: Secondary | ICD-10-CM | POA: Insufficient documentation

## 2023-04-13 DIAGNOSIS — I2510 Atherosclerotic heart disease of native coronary artery without angina pectoris: Secondary | ICD-10-CM | POA: Diagnosis not present

## 2023-04-13 NOTE — ED Provider Notes (Signed)
[place] EMERGENCY DEPARTMENT AT [facility] Provider Note   CSN: [id number] Arrival date & time: 04/13/23  1330     History  Chief Complaint  Patient presents with   Epistaxis   HPI Bryce Ortega is a 62 y.o. male with history of cocaine abuse, CAD, MI presenting for epistaxis.  States that about an hour and a half ago he started bleeding from his left near.  States the bleeding lasted for 10 minutes but improved after he wiped his nose a couple times.  Reports snorting cocaine the day prior.  States he snorts cocaine "a couple times a week".  Denies shortness of breath, chest pain or fatigue or lightheadedness.  Takes a daily aspirin but denies any other blood thinning medications.   Epistaxis      Home Medications Prior to Admission medications   Medication Sig Start Date End Date Taking? Authorizing Provider  amLODipine (NORVASC) 5 MG tablet Take 1 tablet (5 mg total) by mouth daily. 11/14/20   Derwood Kaplan, MD  aspirin EC 81 MG tablet Take 1 tablet (81 mg total) by mouth daily. 03/20/18   Hongalgi, Maximino Greenland, MD  atorvastatin (LIPITOR) 40 MG tablet Take 1 tablet (40 mg total) by mouth daily. 08/26/20   Sabino Dick, DO  carvedilol (COREG) 3.125 MG tablet Take 1 tablet (3.125 mg total) by mouth 2 (two) times daily with a meal. 10/15/21   Tilden Fossa, MD  diclofenac (VOLTAREN) 75 MG EC tablet Take 1 tablet (75 mg total) by mouth 2 (two) times daily. 05/06/22   Elson Areas, PA-C  isosorbide mononitrate (IMDUR) 60 MG 24 hr tablet Take 0.5 tablets (30 mg total) by mouth daily. 09/11/21   Harris, Abigail, PA-C  losartan (COZAAR) 50 MG tablet Take 1 tablet (50 mg total) by mouth daily. 09/08/21 10/08/21  Pokhrel, Rebekah Chesterfield, MD  nitroGLYCERIN (NITROSTAT) 0.4 MG SL tablet PLACE 1 TABLET (0.4 MG TOTAL) UNDER THE TONGUE EVERY FIVE MINUTES AS NEEDED FOR CHEST PAIN (FOR 3 DOSES). Patient taking differently: Place 0.4 mg under the tongue every 5 (five) minutes x 3 doses  as needed for chest pain. 08/26/20 09/11/21  Sabino Dick, DO      Allergies    Propoxyphene n-acetaminophen    Review of Systems   Review of Systems  HENT:  Positive for nosebleeds.     Physical Exam Updated Vital Signs BP (!) 144/107 (BP Location: Left Arm)   Pulse 83   Temp 97.8 F (36.6 C) (Oral)   Resp 16   SpO2 97%  Physical Exam Constitutional:      Appearance: Normal appearance.  HENT:     Head: Normocephalic.     Comments: Inspection of the nose revealed dried blood in the anterior left nare.  No active bleeding noted.  Right nare appears normal.    Nose: Nose normal.  Eyes:     Conjunctiva/sclera: Conjunctivae normal.  Cardiovascular:     Rate and Rhythm: Normal rate and regular rhythm.     Pulses: Normal pulses.     Heart sounds: Normal heart sounds.  Pulmonary:     Effort: Pulmonary effort is normal.     Breath sounds: Normal breath sounds.  Neurological:     Mental Status: He is alert.  Psychiatric:        Mood and Affect: Mood normal.     ED Results / Procedures / Treatments   Labs (all labs ordered are listed, but only abnormal results are displayed) Labs  Reviewed - No data to display  EKG None  Radiology No results found.  Procedures Procedures    Medications Ordered in ED Medications - No data to display  ED Course/ Medical Decision Making/ A&P                                 Medical Decision Making  62 year old well-appearing male presenting for epistaxis.  Exam notable for blood in the left naris but otherwise reassuring.  At this time I have low suspicion for symptomatic anemia or active hemorrhage.  Suspect his cocaine use may be contributing to his symptoms today.  Strongly advised to stop using cocaine and discussed risk factors if he continues.  Advised to follow-up with PCP.  Discussed return precautions should his symptoms worsen.  Blood pressure noted to be somewhat elevated but patient denied symptoms concerning for  hypertensive emergency.  Suspect could be related to his cocaine use.  Advised to continue taking his oral antihypertensives as prescribed him.  Discharged.        Final Clinical Impression(s) / ED Diagnoses Final diagnoses:  Epistaxis    Rx / DC Orders ED Discharge Orders     None         Gareth Eagle, PA-C 04/13/23 1423    Derwood Kaplan, MD 04/14/23 [phone]

## 2023-04-13 NOTE — ED Triage Notes (Signed)
POV c/o nose bleed about 45 mins ago left nare, resolved on its own Denies any other symptoms

## 2023-04-13 NOTE — Discharge Instructions (Signed)
Evaluation is overall reassuring.  There was some dried blood in her left nasal passage but does not appear to be actively bleeding.  Suspect your nosebleeding likely related to your cocaine use.  Please stop using cocaine as it could put you at risk for other serious health problems primarily heart attack.  Otherwise recommend you follow-up with your PCP.  If your nose begins to bleed again you can return for further evaluation.
